# Patient Record
Sex: Male | Born: 1935 | Race: White | Hispanic: No | Marital: Married | Smoking: Current some day smoker
Health system: Southern US, Community
[De-identification: ages and names within clinical notes are randomized; demographics above are authoritative.]

## PROBLEM LIST (undated history)

## (undated) DIAGNOSIS — E7800 Pure hypercholesterolemia, unspecified: Secondary | ICD-10-CM

## (undated) DIAGNOSIS — Z800 Family history of malignant neoplasm of digestive organs: Secondary | ICD-10-CM

## (undated) DIAGNOSIS — Z8481 Family history of carrier of genetic disease: Secondary | ICD-10-CM

## (undated) DIAGNOSIS — Z8042 Family history of malignant neoplasm of prostate: Secondary | ICD-10-CM

## (undated) DIAGNOSIS — Z8049 Family history of malignant neoplasm of other genital organs: Secondary | ICD-10-CM

## (undated) DIAGNOSIS — C801 Malignant (primary) neoplasm, unspecified: Secondary | ICD-10-CM

## (undated) DIAGNOSIS — Z801 Family history of malignant neoplasm of trachea, bronchus and lung: Secondary | ICD-10-CM

## (undated) DIAGNOSIS — C61 Malignant neoplasm of prostate: Secondary | ICD-10-CM

## (undated) DIAGNOSIS — C320 Malignant neoplasm of glottis: Secondary | ICD-10-CM

## (undated) DIAGNOSIS — M1990 Unspecified osteoarthritis, unspecified site: Secondary | ICD-10-CM

## (undated) DIAGNOSIS — T7840XA Allergy, unspecified, initial encounter: Secondary | ICD-10-CM

## (undated) DIAGNOSIS — C4492 Squamous cell carcinoma of skin, unspecified: Secondary | ICD-10-CM

## (undated) DIAGNOSIS — K219 Gastro-esophageal reflux disease without esophagitis: Secondary | ICD-10-CM

## (undated) HISTORY — PX: PROSTATECTOMY: SHX69

## (undated) HISTORY — PX: TONSILLECTOMY: SUR1361

## (undated) HISTORY — DX: Malignant neoplasm of glottis: C32.0

## (undated) HISTORY — DX: Family history of malignant neoplasm of prostate: Z80.42

## (undated) HISTORY — DX: Family history of carrier of genetic disease: Z84.81

## (undated) HISTORY — DX: Family history of malignant neoplasm of trachea, bronchus and lung: Z80.1

## (undated) HISTORY — PX: JOINT REPLACEMENT: SHX530

## (undated) HISTORY — PX: SKIN CANCER EXCISION: SHX779

## (undated) HISTORY — DX: Squamous cell carcinoma of skin, unspecified: C44.92

## (undated) HISTORY — DX: Family history of malignant neoplasm of other genital organs: Z80.49

## (undated) HISTORY — DX: Family history of malignant neoplasm of digestive organs: Z80.0

## (undated) HISTORY — DX: Allergy, unspecified, initial encounter: T78.40XA

## (undated) HISTORY — DX: Malignant neoplasm of prostate: C61

---

## 1999-12-04 ENCOUNTER — Encounter: Payer: Self-pay | Admitting: Gastroenterology

## 2004-09-12 ENCOUNTER — Ambulatory Visit: Payer: Self-pay | Admitting: Pulmonary Disease

## 2004-11-12 ENCOUNTER — Ambulatory Visit: Payer: Self-pay | Admitting: Pulmonary Disease

## 2004-11-19 ENCOUNTER — Ambulatory Visit: Payer: Self-pay | Admitting: Pulmonary Disease

## 2005-03-11 ENCOUNTER — Ambulatory Visit: Payer: Self-pay | Admitting: Pulmonary Disease

## 2005-08-22 ENCOUNTER — Ambulatory Visit (HOSPITAL_COMMUNITY): Admission: RE | Admit: 2005-08-22 | Discharge: 2005-08-22 | Payer: Self-pay | Admitting: Urology

## 2005-09-25 ENCOUNTER — Ambulatory Visit: Payer: Self-pay | Admitting: Pulmonary Disease

## 2005-11-07 ENCOUNTER — Ambulatory Visit: Payer: Self-pay | Admitting: Pulmonary Disease

## 2005-11-19 ENCOUNTER — Ambulatory Visit: Payer: Self-pay | Admitting: Pulmonary Disease

## 2005-11-22 ENCOUNTER — Ambulatory Visit: Payer: Self-pay | Admitting: Cardiology

## 2005-12-05 ENCOUNTER — Ambulatory Visit: Payer: Self-pay | Admitting: Pulmonary Disease

## 2005-12-05 ENCOUNTER — Ambulatory Visit: Payer: Self-pay | Admitting: Cardiovascular Disease

## 2005-12-09 ENCOUNTER — Ambulatory Visit: Payer: Self-pay | Admitting: Pulmonary Disease

## 2005-12-19 ENCOUNTER — Ambulatory Visit: Payer: Self-pay | Admitting: Cardiovascular Disease

## 2006-01-15 ENCOUNTER — Ambulatory Visit: Payer: Self-pay | Admitting: Gastroenterology

## 2006-01-17 ENCOUNTER — Ambulatory Visit: Payer: Self-pay

## 2006-01-17 ENCOUNTER — Ambulatory Visit: Payer: Self-pay | Admitting: Cardiovascular Disease

## 2006-08-12 ENCOUNTER — Ambulatory Visit: Payer: Self-pay | Admitting: Pulmonary Disease

## 2006-12-15 ENCOUNTER — Ambulatory Visit: Payer: Self-pay | Admitting: Pulmonary Disease

## 2006-12-15 LAB — CONVERTED CEMR LAB
ALT: 18 units/L (ref 0–40)
AST: 20 units/L (ref 0–37)
Albumin: 3.5 g/dL (ref 3.5–5.2)
Alkaline Phosphatase: 51 units/L (ref 39–117)
BUN: 18 mg/dL (ref 6–23)
Bacteria, UA: NEGATIVE
Basophils Absolute: 0 10*3/uL (ref 0.0–0.1)
Basophils Relative: 0.8 % (ref 0.0–1.0)
Bilirubin Urine: NEGATIVE
Bilirubin, Direct: 0.2 mg/dL (ref 0.0–0.3)
CO2: 29 meq/L (ref 19–32)
Calcium: 8.9 mg/dL (ref 8.4–10.5)
Chloride: 106 meq/L (ref 96–112)
Cholesterol: 174 mg/dL (ref 0–200)
Creatinine, Ser: 0.9 mg/dL (ref 0.4–1.5)
Crystals: NEGATIVE
Eosinophils Absolute: 0.1 10*3/uL (ref 0.0–0.6)
Eosinophils Relative: 2.6 % (ref 0.0–5.0)
GFR calc Af Amer: 106 mL/min
GFR calc non Af Amer: 87 mL/min
Glucose, Bld: 117 mg/dL — ABNORMAL HIGH (ref 70–99)
HCT: 44.4 % (ref 39.0–52.0)
HDL: 49.8 mg/dL (ref >39.0)
Hemoglobin: 15 g/dL (ref 13.0–17.0)
Ketones, ur: NEGATIVE mg/dL
LDL Cholesterol: 113 mg/dL — ABNORMAL HIGH (ref 0–99)
Leukocytes, UA: NEGATIVE
Lymphocytes Relative: 25.9 % (ref 12.0–46.0)
MCHC: 33.9 g/dL (ref 30.0–36.0)
MCV: 96.4 fL (ref 78.0–100.0)
Monocytes Absolute: 0.5 10*3/uL (ref 0.2–0.7)
Monocytes Relative: 10.4 % (ref 3.0–11.0)
Mucus, UA: NEGATIVE
Neutro Abs: 2.7 10*3/uL (ref 1.4–7.7)
Neutrophils Relative %: 60.3 % (ref 43.0–77.0)
Nitrite: NEGATIVE
PSA: 0.01 ng/mL — ABNORMAL LOW (ref 0.10–4.00)
Platelets: 246 10*3/uL (ref 150–400)
Potassium: 4 meq/L (ref 3.5–5.1)
RBC: 4.6 M/uL (ref 4.22–5.81)
RDW: 12.2 % (ref 11.5–14.6)
Sodium: 142 meq/L (ref 135–145)
Specific Gravity, Urine: 1.02 (ref 1.000–1.03)
TSH: 2.26 microintl units/mL (ref 0.35–5.50)
Total Bilirubin: 1.2 mg/dL (ref 0.3–1.2)
Total CHOL/HDL Ratio: 3.5
Total Protein, Urine: NEGATIVE mg/dL
Total Protein: 6.7 g/dL (ref 6.0–8.3)
Triglycerides: 56 mg/dL (ref 0–149)
Urine Glucose: NEGATIVE mg/dL
Urobilinogen, UA: 0.2 (ref 0.0–1.0)
VLDL: 11 mg/dL (ref 0–40)
WBC, UA: NONE SEEN cells/hpf
WBC: 4.5 10*3/uL (ref 4.5–10.5)
pH: 6 (ref 5.0–8.0)

## 2006-12-22 ENCOUNTER — Ambulatory Visit: Payer: Self-pay | Admitting: Pulmonary Disease

## 2007-04-01 ENCOUNTER — Ambulatory Visit: Payer: Self-pay | Admitting: Pulmonary Disease

## 2007-04-01 LAB — CONVERTED CEMR LAB
ALT: 16 units/L (ref 0–40)
AST: 19 units/L (ref 0–37)
Albumin: 3.6 g/dL (ref 3.5–5.2)
Alkaline Phosphatase: 44 units/L (ref 39–117)
BUN: 22 mg/dL (ref 6–23)
Bilirubin, Direct: 0.1 mg/dL (ref 0.0–0.3)
CO2: 29 meq/L (ref 19–32)
Calcium: 8.8 mg/dL (ref 8.4–10.5)
Chloride: 108 meq/L (ref 96–112)
Cholesterol: 172 mg/dL (ref 0–200)
Creatinine, Ser: 1 mg/dL (ref 0.4–1.5)
GFR calc Af Amer: 94 mL/min
GFR calc non Af Amer: 77 mL/min
Glucose, Bld: 108 mg/dL — ABNORMAL HIGH (ref 70–99)
HDL: 53.3 mg/dL (ref >39.0)
LDL Cholesterol: 110 mg/dL — ABNORMAL HIGH (ref 0–99)
Potassium: 4.2 meq/L (ref 3.5–5.1)
Sodium: 142 meq/L (ref 135–145)
Total Bilirubin: 1 mg/dL (ref 0.3–1.2)
Total CHOL/HDL Ratio: 3.2
Total Protein: 6.7 g/dL (ref 6.0–8.3)
Triglycerides: 46 mg/dL (ref 0–149)
VLDL: 9 mg/dL (ref 0–40)

## 2007-08-12 ENCOUNTER — Ambulatory Visit: Payer: Self-pay | Admitting: Pulmonary Disease

## 2007-12-01 ENCOUNTER — Telehealth: Payer: Self-pay | Admitting: Pulmonary Disease

## 2007-12-22 ENCOUNTER — Ambulatory Visit: Payer: Self-pay | Admitting: Pulmonary Disease

## 2007-12-22 DIAGNOSIS — E7800 Pure hypercholesterolemia, unspecified: Secondary | ICD-10-CM | POA: Insufficient documentation

## 2007-12-22 LAB — CONVERTED CEMR LAB: Vit D, 1,25-Dihydroxy: 45 (ref 30–89)

## 2007-12-30 ENCOUNTER — Ambulatory Visit: Payer: Self-pay | Admitting: Pulmonary Disease

## 2007-12-30 DIAGNOSIS — N281 Cyst of kidney, acquired: Secondary | ICD-10-CM | POA: Insufficient documentation

## 2007-12-30 DIAGNOSIS — J209 Acute bronchitis, unspecified: Secondary | ICD-10-CM | POA: Insufficient documentation

## 2007-12-30 DIAGNOSIS — D71 Functional disorders of polymorphonuclear neutrophils: Secondary | ICD-10-CM | POA: Insufficient documentation

## 2007-12-30 DIAGNOSIS — I10 Essential (primary) hypertension: Secondary | ICD-10-CM | POA: Insufficient documentation

## 2007-12-30 DIAGNOSIS — C61 Malignant neoplasm of prostate: Secondary | ICD-10-CM | POA: Insufficient documentation

## 2007-12-30 DIAGNOSIS — K5730 Diverticulosis of large intestine without perforation or abscess without bleeding: Secondary | ICD-10-CM | POA: Insufficient documentation

## 2007-12-30 DIAGNOSIS — D719 Functional disorders of polymorphonuclear neutrophils, unspecified: Secondary | ICD-10-CM | POA: Insufficient documentation

## 2007-12-30 DIAGNOSIS — IMO0002 Reserved for concepts with insufficient information to code with codable children: Secondary | ICD-10-CM | POA: Insufficient documentation

## 2007-12-30 DIAGNOSIS — C439 Malignant melanoma of skin, unspecified: Secondary | ICD-10-CM | POA: Insufficient documentation

## 2007-12-30 LAB — CONVERTED CEMR LAB
ALT: 24 units/L (ref 0–53)
AST: 21 units/L (ref 0–37)
Albumin: 3.8 g/dL (ref 3.5–5.2)
Alkaline Phosphatase: 42 units/L (ref 39–117)
BUN: 18 mg/dL (ref 6–23)
Bacteria, UA: NEGATIVE
Basophils Absolute: 0 10*3/uL (ref 0.0–0.1)
Basophils Relative: 0.9 % (ref 0.0–1.0)
Bilirubin Urine: NEGATIVE
Bilirubin, Direct: 0.2 mg/dL (ref 0.0–0.3)
CO2: 31 meq/L (ref 19–32)
Calcium: 9.2 mg/dL (ref 8.4–10.5)
Chloride: 105 meq/L (ref 96–112)
Cholesterol: 160 mg/dL (ref 0–200)
Creatinine, Ser: 0.9 mg/dL (ref 0.4–1.5)
Crystals: NEGATIVE
Eosinophils Absolute: 0.1 10*3/uL (ref 0.0–0.6)
Eosinophils Relative: 2.7 % (ref 0.0–5.0)
GFR calc Af Amer: 106 mL/min
GFR calc non Af Amer: 87 mL/min
Glucose, Bld: 102 mg/dL — ABNORMAL HIGH (ref 70–99)
HCT: 44 % (ref 39.0–52.0)
HDL: 53.6 mg/dL (ref >39.0)
Hemoglobin: 14.9 g/dL (ref 13.0–17.0)
LDL Cholesterol: 98 mg/dL (ref 0–99)
Leukocytes, UA: NEGATIVE
Lymphocytes Relative: 28.1 % (ref 12.0–46.0)
MCHC: 33.7 g/dL (ref 30.0–36.0)
MCV: 95.4 fL (ref 78.0–100.0)
Monocytes Absolute: 0.3 10*3/uL (ref 0.2–0.7)
Monocytes Relative: 8.7 % (ref 3.0–11.0)
Neutro Abs: 2.5 10*3/uL (ref 1.4–7.7)
Neutrophils Relative %: 59.6 % (ref 43.0–77.0)
Nitrite: NEGATIVE
PSA: 0 ng/mL — ABNORMAL LOW (ref 0.10–4.00)
Platelets: 253 10*3/uL (ref 150–400)
Potassium: 4.1 meq/L (ref 3.5–5.1)
RBC: 4.61 M/uL (ref 4.22–5.81)
RDW: 12.4 % (ref 11.5–14.6)
Sodium: 140 meq/L (ref 135–145)
Specific Gravity, Urine: 1.02 (ref 1.000–1.03)
Squamous Epithelial / HPF: NEGATIVE /lpf
TSH: 1.67 microintl units/mL (ref 0.35–5.50)
Total Bilirubin: 1.2 mg/dL (ref 0.3–1.2)
Total CHOL/HDL Ratio: 3
Total Protein, Urine: NEGATIVE mg/dL
Total Protein: 6.6 g/dL (ref 6.0–8.3)
Triglycerides: 42 mg/dL (ref 0–149)
Urine Glucose: NEGATIVE mg/dL
Urobilinogen, UA: 0.2 (ref 0.0–1.0)
VLDL: 8 mg/dL (ref 0–40)
WBC, UA: NONE SEEN cells/hpf
WBC: 4 10*3/uL — ABNORMAL LOW (ref 4.5–10.5)
pH: 6 (ref 5.0–8.0)

## 2008-08-03 ENCOUNTER — Ambulatory Visit: Payer: Self-pay | Admitting: Pulmonary Disease

## 2008-12-20 ENCOUNTER — Telehealth: Payer: Self-pay | Admitting: Pulmonary Disease

## 2008-12-21 ENCOUNTER — Ambulatory Visit: Payer: Self-pay | Admitting: Pulmonary Disease

## 2008-12-28 ENCOUNTER — Ambulatory Visit: Payer: Self-pay | Admitting: Pulmonary Disease

## 2008-12-28 LAB — CONVERTED CEMR LAB
ALT: 18 units/L (ref 0–53)
AST: 19 units/L (ref 0–37)
Albumin: 3.8 g/dL (ref 3.5–5.2)
Alkaline Phosphatase: 44 units/L (ref 39–117)
BUN: 22 mg/dL (ref 6–23)
Basophils Absolute: 0 10*3/uL (ref 0.0–0.1)
Basophils Relative: 0.8 % (ref 0.0–3.0)
Bilirubin Urine: NEGATIVE
Bilirubin, Direct: 0.1 mg/dL (ref 0.0–0.3)
CO2: 29 meq/L (ref 19–32)
Calcium: 9.1 mg/dL (ref 8.4–10.5)
Chloride: 106 meq/L (ref 96–112)
Cholesterol: 163 mg/dL (ref 0–200)
Creatinine, Ser: 0.9 mg/dL (ref 0.4–1.5)
Eosinophils Absolute: 0.1 10*3/uL (ref 0.0–0.7)
Eosinophils Relative: 3.4 % (ref 0.0–5.0)
GFR calc Af Amer: 105 mL/min
GFR calc non Af Amer: 87 mL/min
Glucose, Bld: 112 mg/dL — ABNORMAL HIGH (ref 70–99)
HCT: 44.1 % (ref 39.0–52.0)
HDL: 50.9 mg/dL (ref >39.0)
Hemoglobin: 14.9 g/dL (ref 13.0–17.0)
Ketones, ur: NEGATIVE mg/dL
LDL Cholesterol: 104 mg/dL — ABNORMAL HIGH (ref 0–99)
Leukocytes, UA: NEGATIVE
Lymphocytes Relative: 30.4 % (ref 12.0–46.0)
MCHC: 33.8 g/dL (ref 30.0–36.0)
MCV: 97 fL (ref 78.0–100.0)
Monocytes Absolute: 0.5 10*3/uL (ref 0.1–1.0)
Monocytes Relative: 11 % (ref 3.0–12.0)
Neutro Abs: 2.5 10*3/uL (ref 1.4–7.7)
Neutrophils Relative %: 54.4 % (ref 43.0–77.0)
Nitrite: NEGATIVE
PSA: 0.01 ng/mL — ABNORMAL LOW (ref 0.10–4.00)
Platelets: 232 10*3/uL (ref 150–400)
Potassium: 3.9 meq/L (ref 3.5–5.1)
RBC: 4.55 M/uL (ref 4.22–5.81)
RDW: 12.4 % (ref 11.5–14.6)
Sodium: 141 meq/L (ref 135–145)
Specific Gravity, Urine: 1.02 (ref 1.000–1.03)
TSH: 1.92 microintl units/mL (ref 0.35–5.50)
Total Bilirubin: 1.1 mg/dL (ref 0.3–1.2)
Total CHOL/HDL Ratio: 3.2
Total Protein, Urine: NEGATIVE mg/dL
Total Protein: 6.4 g/dL (ref 6.0–8.3)
Triglycerides: 42 mg/dL (ref 0–149)
Urine Glucose: NEGATIVE mg/dL
Urobilinogen, UA: 0.2 (ref 0.0–1.0)
VLDL: 8 mg/dL (ref 0–40)
WBC: 4.4 10*3/uL — ABNORMAL LOW (ref 4.5–10.5)
pH: 6 (ref 5.0–8.0)

## 2009-08-04 ENCOUNTER — Ambulatory Visit: Payer: Self-pay | Admitting: Pulmonary Disease

## 2009-09-29 ENCOUNTER — Ambulatory Visit: Payer: Self-pay | Admitting: Pulmonary Disease

## 2009-10-04 DIAGNOSIS — J309 Allergic rhinitis, unspecified: Secondary | ICD-10-CM | POA: Insufficient documentation

## 2009-10-06 ENCOUNTER — Encounter: Payer: Self-pay | Admitting: Adult Health

## 2009-10-06 LAB — CONVERTED CEMR LAB: Streptococcus, Group A Screen (Direct): NEGATIVE

## 2009-10-09 ENCOUNTER — Telehealth (INDEPENDENT_AMBULATORY_CARE_PROVIDER_SITE_OTHER): Payer: Self-pay | Admitting: *Deleted

## 2009-10-18 ENCOUNTER — Telehealth (INDEPENDENT_AMBULATORY_CARE_PROVIDER_SITE_OTHER): Payer: Self-pay | Admitting: *Deleted

## 2009-10-28 HISTORY — PX: COLONOSCOPY: SHX174

## 2010-01-04 ENCOUNTER — Ambulatory Visit: Payer: Self-pay | Admitting: Sports Medicine

## 2010-01-05 ENCOUNTER — Encounter (INDEPENDENT_AMBULATORY_CARE_PROVIDER_SITE_OTHER): Payer: Self-pay | Admitting: *Deleted

## 2010-01-16 ENCOUNTER — Telehealth (INDEPENDENT_AMBULATORY_CARE_PROVIDER_SITE_OTHER): Payer: Self-pay | Admitting: *Deleted

## 2010-01-18 ENCOUNTER — Ambulatory Visit: Payer: Self-pay | Admitting: Pulmonary Disease

## 2010-01-24 ENCOUNTER — Telehealth: Payer: Self-pay | Admitting: Pulmonary Disease

## 2010-01-28 LAB — CONVERTED CEMR LAB
ALT: 20 units/L (ref 0–53)
AST: 19 units/L (ref 0–37)
Albumin: 3.8 g/dL (ref 3.5–5.2)
Alkaline Phosphatase: 43 units/L (ref 39–117)
BUN: 21 mg/dL (ref 6–23)
Basophils Absolute: 0 10*3/uL (ref 0.0–0.1)
Basophils Relative: 1.2 % (ref 0.0–3.0)
Bilirubin, Direct: 0.2 mg/dL (ref 0.0–0.3)
CO2: 29 meq/L (ref 19–32)
Calcium: 9 mg/dL (ref 8.4–10.5)
Chloride: 107 meq/L (ref 96–112)
Cholesterol: 148 mg/dL (ref 0–200)
Creatinine, Ser: 0.9 mg/dL (ref 0.4–1.5)
Eosinophils Absolute: 0.1 10*3/uL (ref 0.0–0.7)
Eosinophils Relative: 2.9 % (ref 0.0–5.0)
GFR calc non Af Amer: 86.61 mL/min (ref >60)
Glucose, Bld: 104 mg/dL — ABNORMAL HIGH (ref 70–99)
HCT: 42.2 % (ref 39.0–52.0)
HDL: 52.5 mg/dL (ref >39.00)
Hemoglobin: 14.1 g/dL (ref 13.0–17.0)
LDL Cholesterol: 87 mg/dL (ref 0–99)
Lymphocytes Relative: 31.1 % (ref 12.0–46.0)
Lymphs Abs: 1.2 10*3/uL (ref 0.7–4.0)
MCHC: 33.4 g/dL (ref 30.0–36.0)
MCV: 98.6 fL (ref 78.0–100.0)
Monocytes Absolute: 0.4 10*3/uL (ref 0.1–1.0)
Monocytes Relative: 11.1 % (ref 3.0–12.0)
Neutro Abs: 2.2 10*3/uL (ref 1.4–7.7)
Neutrophils Relative %: 53.7 % (ref 43.0–77.0)
PSA: 0 ng/mL — ABNORMAL LOW (ref 0.10–4.00)
Platelets: 227 10*3/uL (ref 150.0–400.0)
Potassium: 4.2 meq/L (ref 3.5–5.1)
RBC: 4.28 M/uL (ref 4.22–5.81)
RDW: 12.6 % (ref 11.5–14.6)
Sodium: 142 meq/L (ref 135–145)
TSH: 2.05 microintl units/mL (ref 0.35–5.50)
Total Bilirubin: 1 mg/dL (ref 0.3–1.2)
Total CHOL/HDL Ratio: 3
Total Protein: 6.5 g/dL (ref 6.0–8.3)
Triglycerides: 41 mg/dL (ref 0.0–149.0)
VLDL: 8.2 mg/dL (ref 0.0–40.0)
WBC: 3.9 10*3/uL — ABNORMAL LOW (ref 4.5–10.5)

## 2010-02-05 ENCOUNTER — Ambulatory Visit: Payer: Self-pay | Admitting: Gastroenterology

## 2010-02-05 DIAGNOSIS — K625 Hemorrhage of anus and rectum: Secondary | ICD-10-CM | POA: Insufficient documentation

## 2010-02-06 ENCOUNTER — Ambulatory Visit: Payer: Self-pay | Admitting: Gastroenterology

## 2010-02-09 ENCOUNTER — Encounter: Payer: Self-pay | Admitting: Gastroenterology

## 2010-06-20 ENCOUNTER — Ambulatory Visit: Payer: Self-pay | Admitting: Pulmonary Disease

## 2010-06-20 DIAGNOSIS — R490 Dysphonia: Secondary | ICD-10-CM | POA: Insufficient documentation

## 2010-06-22 ENCOUNTER — Telehealth: Payer: Self-pay | Admitting: Pulmonary Disease

## 2010-07-09 ENCOUNTER — Encounter: Payer: Self-pay | Admitting: Pulmonary Disease

## 2010-07-17 ENCOUNTER — Encounter: Payer: Self-pay | Admitting: Pulmonary Disease

## 2010-08-07 ENCOUNTER — Encounter: Payer: Self-pay | Admitting: Pulmonary Disease

## 2010-08-13 ENCOUNTER — Encounter: Payer: Self-pay | Admitting: Pulmonary Disease

## 2010-11-17 ENCOUNTER — Encounter: Payer: Self-pay | Admitting: Urology

## 2010-11-27 NOTE — Letter (Signed)
Summary: Results Letter  [facility]  [address] [place], [place] [address]   Phone: [phone]  Fax: [phone]        February 09, 2010 MRN: [id number]    Darren Edwards [address] [place], [place]  [address]    Dear Mr. FOXWORTH,  Your biopsy results did not show any remarkable findings.  Please continue with the recommendations previously discussed.  Should you have any further questions or immediate concers, feel free to contact me.  Sincerely,  Barbette Hair. Arlyce Dice, M.D., [facility]          Sincerely,  Louis Meckel MD  This letter has been electronically signed by your physician.  Appended Document: Results Letter letter mailed 4.20.11.

## 2010-11-27 NOTE — Assessment & Plan Note (Signed)
Summary: blood in stool,constipation...as.    History of Present Illness Visit Type: Initial Visit Primary GI MD: Melvia Heaps MD [facility] Primary Provider: Alroy Dust, MD Chief Complaint: Patient c/o occasional difficulty with constipation and brbpr. He also c/o some rectal discomfort. He denies any problems with abdominal pain. History of Present Illness:   Darren Edwards is  referred at the request of Dr. Jackquline Bosch for evaluation of rectal bleeding.  He complains of painless bright red blood per rectum consistenting of blood mixed with his stools and in the water.  He has had some rectal discomfort in the past although none recently.  Last colonoscopy was 10 years ago where moderate diverticulosis was seen in the sigmoid colon.  He has occasional acid reflux.   GI Review of Systems    Reports acid reflux.      Denies abdominal pain, belching, bloating, chest pain, dysphagia with liquids, dysphagia with solids, heartburn, loss of appetite, nausea, vomiting, vomiting blood, weight loss, and  weight gain.      Reports change in bowel habits, constipation, rectal bleeding, and  rectal pain.     Denies anal fissure, black tarry stools, diarrhea, diverticulosis, fecal incontinence, heme positive stool, hemorrhoids, irritable bowel syndrome, jaundice, light color stool, and  liver problems.    Current Medications (verified): 1)  Adult Aspirin Low Strength 81 Mg  Tbdp (Aspirin) .Marland Kitchen.. 1 By Mouth Daily 2)  Simvastatin 40 Mg Tabs (Simvastatin) .... Take 1/2 Tab By Mouth Once Daily.Marland KitchenMarland Kitchen 3)  Multivitamins   Tabs (Multiple Vitamin) .Marland Kitchen.. 1 By Mouth Daily 4)  Coq10 100 Mg Caps (Coenzyme Q10) .... Take 1 Capsule By Mouth Once A Day 5)  Doctor's Choice (Unknown Dosage) .... Take 3 Tablets Daily (For Diabetes) 6)  Silymarin  Caps (Milk Thistle-Turmeric) .... Take 1 Tablet By Mouth Once A Day  Allergies (verified): 1)  ! * Pencillin  Past History:  Past Medical History: Reviewed history from 02/01/2010 and no  changes required. Hx of ASTHMATIC BRONCHITIS, ACUTE (ICD-466.0)  -  hx AB treated w/ Ab's and Advair transiently in 2003... no recurrent symptoms. CHRONIC GRANULOMATOUS DISEASE (ICD-288.1)  -  old CXR & CTChest 1/07 showed calcif gran in RML and calcif in right hilum and in spleen, no clinical hx of Tb or known exposure... Hx of HYPERTENSION (ICD-401.9)  -  transient elevated BP in the past prev rx w/ Lisinopril 10mg /d... pt prefers diet & exerc and BP's have been fine in recent years...  Family Hx of CORONARY ARTERY DISEASE (ICD-414.00)  -  Brother w/ MI... pt had cath years ago which was reportedly normal... NuclearStressTest 12/03 and again 3/07 were normal- no ischemia, EF=62%... HYPERCHOLESTEROLEMIA (ICD-272.0)  -  on SIMVASTATIN 40mg tabs- 1/2 tab daily + CoQ 10 OTC...  ~  FLP 12/22/07 showed TChol 160, TG 42, HDL 54, LDL 98...   ~  FLP 12/21/08 showed TChol 163, TG 42, HDL 51, LDL 104... he prefers to continue same Rx rather than incr the Simvastatin...   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 never smoked drinks wine 4-5 times a week married 5 children avid runner retired - Airline pilot  Review of Systems  The patient denies allergy/sinus, anemia,  anxiety-new, arthritis/joint pain, back pain, blood in urine, breast changes/lumps, change in vision, confusion, cough, coughing up blood, depression-new, fainting, fatigue, fever, headaches-new, hearing problems, heart murmur, heart rhythm changes, itching, menstrual pain, muscle pains/cramps, night sweats, nosebleeds, pregnancy symptoms, shortness of breath, skin rash, sleeping problems, sore throat, swelling of  feet/legs, swollen lymph glands, thirst - excessive , urination - excessive , urination changes/pain, urine leakage, vision changes, and voice change.    Vital Signs:  Patient profile:   77 year old male Height:      70 inches Weight:      146.38 pounds BMI:     21.08 BSA:     1.83 Pulse rate:   88 / minute Pulse rhythm:   regular BP sitting:   160 / 76  (left arm)  Vitals Entered By: Hortense Ramal CMA Duncan Dull) (February 05, 2010 1:28 PM)  Physical Exam  Additional Exam:  He is a well-developed well-nourished male appearing younger than his stated age  skin: anicteric HEENT: normocephalic; PEERLA; no nasal or pharyngeal abnormalities neck: supple nodes: no cervical lymphadenopathy chest: clear to ausculatation and percussion heart: no murmurs, gallops, or rubs abd: soft, nontender; BS normoactive; no abdominal masses, tenderness, organomegaly rectal: deferred ext: no cynanosis, clubbing, edema skeletal: no deformities neuro: oriented x 3; no focal abnormalities    Impression & Recommendations:  Problem # 1:  RECTAL BLEEDING (ICD-569.3) Assessment New  Limited rectal bleeding could very well be related to hemorrhoids.  A more proximal colonic bleeding source should be ruled out.  Recommendations #1 colonoscopy  Risks, alternatives, and complications of the procedure, including bleeding, perforation, and possible need for surgery, were explained to the patient.  Patient's questions were answered.  Orders: Colonoscopy (Colon)  Problem # 2:  ADENOCARCINOMA, PROSTATE (ICD-185) Assessment: Comment Only  Patient Instructions: 1)  Your colonoscopy is scheduled for 02/06/2010 at [facility] 4th floor arrive 2:30pm. 2)  Colonoscopy and Flexible Sigmoidoscopy brochure given.  3)  Conscious Sedation brochure given.  4)  Copy sent to : Alroy Dust, MD 5)  The medication list was reviewed and reconciled.  All changed / newly prescribed medications were explained.  A complete  medication list was provided to the patient / caregiver. Prescriptions: MOVIPREP 100 GM  SOLR (PEG-KCL-NACL-NASULF-NA ASC-C) As per prep instructions.  #1 x 0   Entered by:   Harlow Mares CMA (AAMA)   Authorized by:   Louis Meckel MD   Signed by:   Harlow Mares CMA (AAMA) on 02/05/2010   Method used:   Electronically to        [facility] [phone]* (retail)       [address]       [place], [place]  [address]       Ph: [phone] or [phone]       Fax: [phone]   RxID:   [id number]

## 2010-11-27 NOTE — Assessment & Plan Note (Signed)
Summary: PLANTER FACIITIS,MC   Vital Signs:  Patient profile:   77 year old male BP sitting:   152 / 97  Vitals Entered By: Lillia Pauls CMA (January 04, 2010 11:24 AM)  History of Present Illness: 77 y/o M with self diagnosed R plantar fasciitis.  Pt has been a runne for > 40 yrs.  Pt just bought [place] sock today and would like to know if these would be useful for his pain.  Pt usually walks +2 miles daily (about 27-28 minutes).  Does up to 5 miles on longer days of walk and jog.  does schwinn air dyne for 45 mins as well.  One day in early Feb 2011he went for a longer walk/run which lead to R foot pain.  Since then he has had R foot pain that has limited his ability to walk without discomfort.  Denies heel pain.  Pain is mostly located in arch area.  +swelling, + redness, +burning sensation ("feels like fire crackers in there")  Pt wears arch straps over each foot.   had also ordered heel lifts  I had seen him 20 yrs ago and given leg exercises for knee pain and this is much better leg length diff with left shorter and uses gel heel lift    Allergies: 1)  ! * Pencillin  Past History:  Past Medical History: Last updated: 09/29/2009 Hx of ASTHMATIC BRONCHITIS, ACUTE (ICD-466.0)  -  hx AB treated w/ Ab's and Advair transiently in 2003... no recurrent symptoms.  CHRONIC GRANULOMATOUS DISEASE (ICD-288.1)  -  old CXR & CTChest 1/07 showed calcif gran in RML and calcif in right hilum and in spleen, no clinical hx of Tb or known exposure...  Hx of HYPERTENSION (ICD-401.9)  -  transient elevated BP in the past prev rx w/ Lisinopril 10mg /d... pt prefers diet & exerc and BP's have been fine in recent years...   Family Hx of CORONARY ARTERY DISEASE (ICD-414.00)  -  Brother w/ MI... pt had cath years ago which was reportedly normal... NuclearStressTest 12/03 and again 3/07 were normal- no ischemia, EF=62%...  HYPERCHOLESTEROLEMIA (ICD-272.0)  -  on SIMVASTATIN 40mg tabs- 1/2 tab daily  + CoQ 10 OTC...  ~  FLP 12/22/07 showed TChol 160, TG 42, HDL 54, LDL 98...   ~  FLP 12/21/08 showed TChol 163, TG 42, HDL 51, LDL 104... he prefers to continue same Rx rather than incr the Simvastatin...     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 at insertion this is only 0.29 There is a small heel spur noted but no tear and is nontender in this area PF membrane to great toe without any nodules  1st TMT: normal 2nd TMT: mild swelling 3rd TMT: some mild effusion; increased angulation of TMT joint 4th and 5th: normal  images saved   Foot/Ankle Exam  Vascular:    dorsalis pedis and posterior tibial pulses 2+ and symmetric  Foot Exam:    Right:    Inspection:  Normal    Palpation:  Normal    Stability:  stable    Tenderness:  no    Swelling:  no    Erythema:  no    No heel pain.   Normal inspection with no visable or palpable fat pad atrophy and no visible swelling/erythema. There is no tenderness at medial insertion of plantar fascia into calcaneus. Great toe motion: full, no pain Arch shape: wnl    Impression & Recommendations:  Problem # 1:  FOOT PAIN, RIGHT (ICD-729.5)  Ultrasound showed normal plantar fascia.  Pain most likely from longitudinal arch strain.  We gave pt Sports insole for arch support.  we may need to add more support and orthotics are a possibility.  Will have pt perform daily excercises to strengthen calf.  Pt to  rtc in 6 wks.    After placing insole in each shoe, pt states less discomfort with walking and running.  Exam showed that he was still favoring the R side on running gait.  Discussed with pt that he should be mindful of this and work to "straighten" his gait.    Orders: [place] LIMITED ([id number]) Sports Insoles [phone])  Complete Medication List: 1)  Adult Aspirin Low Strength 81 Mg Tbdp (Aspirin) .Marland Kitchen.. 1 by mouth daily 2)  Simvastatin 40 Mg Tabs (Simvastatin) .... Take 1/2 tab by mouth once daily.Marland KitchenMarland Kitchen 3)  Multivitamins Tabs (Multiple vitamin) .Marland Kitchen.. 1 by mouth daily  Patient Instructions: 1)  Please schedule a follow-up appointment in 6 weeks. 2)  We use the ultrasound machine to scan your foot today.  I think that your plantar fascia is ok.  Your foot pain is most likely due to arch strain.  3)  Continue using your arch straps. 4)  Excerses for plantar fasciitis: 5)  Toe stretch  6)  Wall stretch 7)  Build up your calf strength with these exercises below, about 3 sets of 8-10 reps 8)  Toe walking forward and backward 9)  Heel walking 10)  Heel raises 11)  Walk-Run interval about 2 miles for a couple of weeks. Then build up to your usual 5 miles daily. 12)  You should take Aleve twice a day MAX  Appended Document: PLANTER FACIITIS,MC

## 2010-11-27 NOTE — Miscellaneous (Signed)
  Medications Added SIMVASTATIN 40 MG TABS (SIMVASTATIN) take 1/2 tab by mouth at bedtime...       Clinical Lists Changes  Medications: Added new medication of SIMVASTATIN 40 MG TABS (SIMVASTATIN) take 1/2 tab by mouth at bedtime... - Signed Rx of SIMVASTATIN 40 MG TABS (SIMVASTATIN) take 1/2 tab by mouth at bedtime...;  #30 x 12;  Signed;  Entered by: Michele Mcalpine MD;  Authorized by: Michele Mcalpine MD;  Method used: Print then Give to Patient    Prescriptions: SIMVASTATIN 40 MG TABS (SIMVASTATIN) take 1/2 tab by mouth at bedtime...  #30 x 12   Entered and Authorized by:   Michele Mcalpine MD   Signed by:   Michele Mcalpine MD on 07/09/2010   Method used:   Print then Give to Patient   RxID:   [id number]

## 2010-11-27 NOTE — Procedures (Signed)
Summary: [employer]il [employer]   Imported By: Sherian Rein 02/07/2010 11:18:37  _____________________________________________________________________  External Attachment:    Type:   Image     Comment:   External Document

## 2010-11-27 NOTE — Letter (Signed)
Summary: New Patient letter  [facility]  [address] [place], [place] [address]   Phone: [phone]  Fax: [phone]       01/05/2010 MRN: [id number]  Darren Edwards [address] [place], [place]  [address]  Dear Darren Edwards,  Welcome to the Gastroenterology Division at [facility].    You are scheduled to see Dr.  Arlyce Dice on 02/05/2010 at 1:30PM on the 3rd floor at [facility], [address]  We ask that you try to arrive at our office 15 minutes prior to your appointment time to allow for check-in.  We would like you to complete the enclosed self-administered evaluation form prior to your visit and bring it with you on the day of your appointment.  We will review it with you.  Also, please bring a complete list of all your medications or, if you prefer, bring the medication bottles and we will list them.  Please bring your insurance card so that we may make a copy of it.  If your insurance requires a referral to see a specialist, please bring your referral form from your primary care physician.  Co-payments are due at the time of your visit and may be paid by cash, check or credit card.     Your office visit will consist of a consult with your physician (includes a physical exam), any laboratory testing he/she may order, scheduling of any necessary diagnostic testing (e.g. x-ray, ultrasound, CT-scan), and scheduling of a procedure (e.g. Endoscopy, Colonoscopy) if required.  Please allow enough time on your schedule to allow for any/all of these possibilities.    If you cannot keep your appointment, please call [phone] to cancel or reschedule prior to your appointment date.  This allows [place] the opportunity to schedule an appointment for another patient in need of care.  If you do not cancel or reschedule by 5 p.m. the business day prior to your appointment date, you will be charged a $50.00 late cancellation/no-show fee.    Thank you for choosing  [facility] for your medical needs.  We appreciate the opportunity to care for you.  Please visit [place] at our website  to learn more about our practice.                     Sincerely,                                                             The Gastroenterology Division

## 2010-11-27 NOTE — Progress Notes (Signed)
Summary: set up labs  Phone Note Call from Patient Call back at Home Phone [phone]   Caller: Spouse Call For: nadel Summary of Call: per spouse marilyn: please set up labs for pt even though his physical isn't due yet. he's having a colonoscopy done soon and just wants to have all his labs done at this time. call when he can come in for this. thanks.  Initial call taken by: Tivis Ringer, CNA,  January 16, 2010 10:07 AM  Follow-up for Phone Call        pt wants to go ahead and have labs done now even though his physical is schedueld for may. He wants these done before he has colonoscopy. Please advise. Carron Curie CMA  January 16, 2010 10:32 AM   Additional Follow-up for Phone Call Additional follow up Details #1::        called and spoke with pts wife---she is aware of labs in the computer for pt on 3/24 and they request a copy be mailed to them once labs are back.  pt has appt in may with SN Randell Loop CMA  January 16, 2010 3:10 PM

## 2010-11-27 NOTE — Miscellaneous (Signed)
Summary[facility]  [facility]   Imported By: Esmeralda Links D'jimraou 08/18/2010 10:34:39  _____________________________________________________________________  External Attachment:    Type:   Image     Comment:   External Document

## 2010-11-27 NOTE — Consult Note (Signed)
Summary: [facility]  [facility]   Imported By: Lennie Odor 07/25/2010 11:19:41  _____________________________________________________________________  External Attachment:    Type:   Image     Comment:   External Document

## 2010-11-27 NOTE — Letter (Signed)
Summary: [facility] Instructions  [facility]  [address] [place], [place] [address]   Phone: [phone]  Fax: [phone]       YOLTZIN BARG    12/04/1928    MRN: [id number]        Procedure Day /Date: 02/06/2010 Tuesday     Arrival Time: 2:30pm     Procedure Time: 3:30pm     Location of Procedure:                    X   [facility] (4th Floor)   PREPARATION FOR COLONOSCOPY WITH MOVIPREP    THE DAY BEFORE YOUR PROCEDURE         DATE: 02/05/2010   DAY:  Monday   1.  Drink clear liquids the entire day-NO SOLID FOOD  2.  Do not drink anything colored red or purple.  Avoid juices with pulp.  No orange juice.  3.  Drink at least 64 oz. (8 glasses) of fluid/clear liquids during the day to prevent dehydration and help the prep work efficiently.  CLEAR LIQUIDS INCLUDE: Water Jello Ice Popsicles Tea (sugar ok, no milk/cream) Powdered fruit flavored drinks Coffee (sugar ok, no milk/cream) Gatorade Juice: apple, white grape, white cranberry  Lemonade Clear bullion, consomm, broth Carbonated beverages (any kind) Strained chicken noodle soup Hard Candy                             4.  In the morning, mix first dose of MoviPrep solution:    Empty 1 Pouch A and 1 Pouch B into the disposable container    Add lukewarm drinking water to the top line of the container. Mix to dissolve    Refrigerate (mixed solution should be used within 24 hrs)  5.  Begin drinking the prep at 5:00 p.m. The MoviPrep container is divided by 4 marks.   Every 15 minutes drink the solution down to the next mark (approximately 8 oz) until the full liter is complete.   6.  Follow completed prep with 16 oz of clear liquid of your choice (Nothing red or purple).  Continue to drink clear liquids until bedtime.  7.  Before going to bed, mix second dose of MoviPrep solution:    Empty 1 Pouch A and 1 Pouch B into the disposable container    Add lukewarm drinking water to  the top line of the container. Mix to dissolve    Refrigerate  THE DAY OF YOUR PROCEDURE      DATE:  02/06/2010 Day: Tuesday  Beginning at 10:30am (5 hours before procedure):         1. Every 15 minutes, drink the solution down to the next mark (approx 8 oz) until the full liter is complete.  2. Follow completed prep with 16 oz. of clear liquid of your choice.    3. You may drink clear liquids until 1:30am  (2 HOURS BEFORE PROCEDURE).   MEDICATION INSTRUCTIONS  Unless otherwise instructed, you should take regular prescription medications with a small sip of water   as early as possible the morning of your procedure.          OTHER INSTRUCTIONS  You will need a responsible adult at least 75 years of age to accompany you and drive you home.   This person must remain in the waiting room during your procedure.  Wear loose fitting clothing that is easily removed.  Leave jewelry and other valuables at home.  However, you may wish to bring a book to read or  an iPod/MP3 player to listen to music as you wait for your procedure to start.  Remove all body piercing jewelry and leave at home.  Total time from sign-in until discharge is approximately 2-3 hours.  You should go home directly after your procedure and rest.  You can resume normal activities the  day after your procedure.  The day of your procedure you should not:   Drive   Make legal decisions   Operate machinery   Drink alcohol   Return to work  You will receive specific instructions about eating, activities and medications before you leave.    The above instructions have been reviewed and explained to me by   _______________________    I fully understand and can verbalize these instructions _____________________________ Date _________

## 2010-11-27 NOTE — Progress Notes (Signed)
Summary: dosage  Phone Note Call from Patient   Caller: Patient Call For: nadel Summary of Call: pt want to talk to dr nadel about zocor dosage. Initial call taken by: Rickard Patience,  June 22, 2010 9:57 AM  Follow-up for Phone Call        Pt states he checked his records and he has been taking Simvastain 80mg  Take 1/2 tablet by mouth once a day since Feb 2008 when his LDL was >100. Pt is concerned about going from Simvastatin 40mg  daily to none. Please advise. Thanks. Zackery Barefoot CMA  June 22, 2010 10:17 AM   Additional Follow-up for Phone Call Additional follow up Details #1::        per SN---ok for pt to stop the zocor now or if he feels like he should wean off of this he can cut dose in half now and take for 2 wks then stop.  called and spoke with pt and he is aware of this.  he will stop the zocor. Randell Loop CMA  June 22, 2010 3:59 PM

## 2010-11-27 NOTE — Procedures (Signed)
Summary: Colonoscopy  Patient: Dymond Spreen Note: All result statuses are Final unless otherwise noted.  Tests: (1) Colonoscopy (COL)   COL Colonoscopy           DONE     [facility]     [address]     [place], [place]  [address]           COLONOSCOPY PROCEDURE REPORT           PATIENT:  Darren Edwards, Darren Edwards  MR#:  [id number]     BIRTHDATE:  02/04/1929, 80 yrs. old  GENDER:  male           ENDOSCOPIST:  Barbette Hair. Arlyce Dice, MD     Referred by:           PROCEDURE DATE:  02/06/2010     PROCEDURE:  Colonoscopy with snare polypectomy     ASA CLASS:  Class II     INDICATIONS:  1) rectal bleeding           MEDICATIONS:   Fentanyl 50 mcg IV, Versed 6 mg IV           DESCRIPTION OF PROCEDURE:   After the risks benefits and     alternatives of the procedure were thoroughly explained, informed     consent was obtained.  Digital rectal exam was performed and     revealed no abnormalities.   The Pentax Colonoscope [id number]     endoscope was introduced through the anus and advanced to the     cecum, which was identified by both the appendix and ileocecal     valve, without limitations.  The quality of the prep was     excellent, using MoviPrep.  The instrument was then slowly     withdrawn as the colon was fully examined.     <<PROCEDUREIMAGES>>           FINDINGS:  A sessile polyp was found in the ascending colon. It     was 4 mm in size. Polyp was snared without cautery. Retrieval was     successful (see image5). snare polyp  Internal hemorrhoids were     found (see image15).  This was otherwise a normal examination of     the colon (see image1, image2, image4, image7, image10, image11,     and image14).   Retroflexed views in the rectum revealed no     abnormalities.    The time to cecum =  1) 7  minutes. The scope     was then withdrawn (time =  1) 3.75  min) from the patient and the     procedure completed.           COMPLICATIONS:  None           ENDOSCOPIC IMPRESSION:     1) 4 mm sessile polyp in the ascending colon     2) Internal hemorrhoids     3) Otherwise normal examination           Limited rectal bleeding secondary to hemorrhoids           RECOMMENDATIONS:     1) Anusol HC supp. prn           REPEAT EXAM:  No           ______________________________     Barbette Hair. Arlyce Dice, MD     cc: Dr. Alroy Dust           n.  eSIGNED:   Barbette Hair. Taquanna Borras at 02/06/2010 04:35 PM           Concha Pyo, [id number]  Note: An exclamation mark (!) indicates a result that was not dispersed into the flowsheet. Document Creation Date: 02/06/2010 4:36 PM _______________________________________________________________________  (1) Order result status: Final Collection or observation date-time: 02/06/2010 16:29 Requested date-time:  Receipt date-time:  Reported date-time:  Referring Physician:   Ordering Physician: Melvia Heaps [phone]) Specimen Source:  Source: Launa Grill Order Number: [id number] Lab site:

## 2010-11-27 NOTE — Progress Notes (Signed)
Summary: copy of labs - awaiting SN signature  Phone Note Call from Patient Call back at Home Phone [phone]   Caller: Spouse//marilyn Call For: Evolet Salminen Summary of Call: Wanted to know if they can come and get a copy of lab results today. Initial call taken by: Darletta Moll,  January 24, 2010 2:57 PM  Follow-up for Phone Call        Pt requestign lab results.  report printed. Pt wants a copy of labs once they are reviewed. Please advise. Carron Curie CMA  January 24, 2010 3:00 PM   Additional Follow-up for Phone Call Additional follow up Details #1::        ok for him to have a copy once SN has reviewed these and signed them off.  thanks Randell Loop CMA  January 24, 2010 3:10 PM   flag sent to [facility] to remind to send a copy to pt once labs are reviewed. I will sign off on message. Carron Curie CMA  January 25, 2010 5:03 PM      Appended Document: copy of labs - awaiting SN signature ATC pt at home #.  LMOM TCB and ask for me or Leigh to give pt his lab results.  Appended Document: copy of labs - awaiting SN signature pt called back this am and he is aware per SN that all labs looked good.  mailed a copy to pt per pts request

## 2010-11-27 NOTE — Assessment & Plan Note (Signed)
Summary: cpx/apc   Primary Care Provider:  Alroy Dust, MD  CC:  18 month ROV & review of mult medical problems....  History of Present Illness: 77 y/o WM here for a follow up visit...    ~  Mar10:  he has no complaints or concerns today and states "I'm doing super"... he is very athletic and runs 4-5 mi 3x/week, does 5K races, and does an exercise bike 3x/week... he is followed at the [facility] yearly and notes that he gets his Zocor for free there...   ~  June 20, 2010:  74mo ROV- "just super" he says... again w/o complaints or concerns, but he is concerned about what he has read about "statins and ALS"> it is his desire to stop his Simvastatin (currently taking 1/2 of the 40mg  pill) and treat his hypercholesterolemia w/ diet & exercise alone... I reminded him of his pos family hx for heart disease (risk factor) but he is undeterred & wants to stop the Zocor Rx... he had fasting labs 3/11 & these are reviewed+ all essent WNL & his PSA remains 0.00 s/p prostate surg 2006 as below...  He had colonoscopy by [facility] 4/11- states his wife made him go due to bl in stool & colon was neg x for 4mm polyp (bx= polypopid mucosa) & int hems...   He saw DrFields 3/11 w/ plantar fasciitis- he felt it was longitudinal arch strain, given arch support & exercises- improved...    Current Problems:   ** HOARSENESS - he has a hoarse, sl weak voice and he notes that he is not able to read aloud for very long... we discussed the need for ENT eval to check his cords & r/o poss lesion... he notes occas reflux symptoms intermittently for 57yrs he says> advised to take OTC PRILOSEC 20mg /d prior to 1st meal of the day.  Hx of ASTHMATIC BRONCHITIS, ACUTE (ICD-466.0)  -  hx AB treated w/ Ab's and Advair transiently in 2003... no recurrent symptoms.  CHRONIC GRANULOMATOUS DISEASE (ICD-288.1)  -  old CXR & CTChest 1/07 showed calcif gran in RML and calcif in right hilum and in spleen, no clinical hx of Tb or known  exposure...  ~  f/u CXR 8/11 showed no change in the calcif mediastinal & hilar LNs, stable.  Hx of HYPERTENSION (ICD-401.9)  -  transient elevated BP in the past prev rx w/ Lisinopril 10mg /d... pt prefers diet & exerc and BP's have been fine in recent years... BP= 124/76 today and averages 120's/ 60's at home... denies HA, fatigue, visual changes, CP, palipit, dizziness, syncope, dyspnea, edema, etc...   Family Hx of CORONARY ARTERY DISEASE (ICD-414.00)  -  he takes ASA 81mg /d... Father had hx CAD, & Brother w/ MI... pt had cath years ago which was reportedly normal... NuclearStressTest 12/03 and again 3/07 were normal- no ischemia, EF=62%...  HYPERCHOLESTEROLEMIA (ICD-272.0)  -  on SIMVASTATIN 40mg tabs- 1/2 tab daily + CoQ 10 OTC...  ~  FLP 2/09 showed TChol 160, TG 42, HDL 54, LDL 98  ~  FLP 2/10 showed TChol 163, TG 42, HDL 51, LDL 104... he prefers to continue same Rx.  ~  FLP 3/11 showed TChol 148, TG 41, HDL 53, LDL 87  ~  8/11: he is concerned about what he has read regarding statins and ALS- he wants to stop his Simva & do the best he can w/o medication on diet alone...  DIVERTICULOSIS OF COLON (ICD-562.10)  -  he had colonoscopy was 2/01 by  DrKaplan & neg x divertics... he states that his wife made him ret to GI due to blood in stool & repeat colonoscopy 4/11 showed 4mm polyp (bx= polypoid mucosa) & int hems...  ADENOCARCINOMA, PROSTATE (ICD-185)  -  Dx 2006 by DrTannenbaum... he went to [facility] for radical prostatectomy surg...  ~  labs 2/10 showed PSA= 0.01  ~  labs 3/11 showed PSA= 0.00  RENAL CYST (ICD-593.2)  -  CTscan has revealed a 3-4cm right renal cyst (upper pole)...  DEGENERATIVE DISC DISEASE (ICD-722.6)  -  periodic evaluations from "sports medicine" DrDaldorf & DrFields for PT and exercises to keep him running... 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  Hx of MALIGNANT MELANOMA (ICD-172.9)  -  stage I MM removed from his back in 6/05 by  DrNolan... he continues to f/u w/ Derm every 6 months (now [facility]).  Health Maintenance:  he takes various nutrients & supplements "to prevent diabetes"  ~  GI:  DrKaplan w/ colonoscopy 4/11 as above.  ~  GU:  DrTannenbaum and [facility]>  we do his yearly PSA & it has remained "zero" since surg.  ~  Immunizations:  he gets the yearly seasonal Flu vaccine each fall;  he had the PNEUMOVAX in 2010 (age 18);  we discussed the 10 yr Tetanus vaccine;  he inquired about the Shingles vaccine & is referred to the [facility] vs Pharm shot clinic for this.   Preventive Screening-Counseling & Management  Alcohol-Tobacco     Smoking Status: never  Allergies: 1)  ! * Pencillin  Comments:  Nurse/Medical Assistant: The patient's medications and allergies were reviewed with the patient and were updated in the Medication and Allergy Lists.  Past History:  Past Medical History: HOARSENESS (EAV-409.81) ALLERGIC RHINITIS (ICD-477.9) Hx of ASTHMATIC BRONCHITIS, ACUTE (ICD-466.0) CHRONIC GRANULOMATOUS DISEASE (ICD-288.1) Hx of HYPERTENSION (ICD-401.9) Family Hx of CORONARY ARTERY DISEASE (ICD-414.00) HYPERCHOLESTEROLEMIA (ICD-272.0) DIVERTICULOSIS OF COLON (ICD-562.10) ADENOCARCINOMA, PROSTATE (ICD-185) RENAL CYST (ICD-593.2) DEGENERATIVE DISC DISEASE (ICD-722.6) Hx of MALIGNANT MELANOMA (ICD-172.9)  Past Surgical History: S/P T & A S/P vasectomy S/P radical prostatectomy melenoma excised from back  Family History: Reviewed history from 02/05/2010 and no changes required. heart disease - father had MI, brother had CABG cancer - sister (lung), father (lung) Family History of Ovarian Cancer: Mother No FH of Colon Cancer:  Social History: Reviewed history from 09/29/2009 and no changes required. never smoked drinks wine 4-5 times a week married 5 children avid runner retired Financial controller Smoking Status:  never  Review of Systems  The patient denies fever, chills, sweats, anorexia,  fatigue, weakness, malaise, weight loss, sleep disorder, blurring, diplopia, eye irritation, eye discharge, vision loss, eye pain, photophobia, earache, ear discharge, tinnitus, decreased hearing, nasal congestion, nosebleeds, sore throat, hoarseness, chest pain, palpitations, syncope, dyspnea on exertion, orthopnea, PND, peripheral edema, cough, dyspnea at rest, excessive sputum, hemoptysis, wheezing, pleurisy, nausea, vomiting, diarrhea, constipation, change in bowel habits, abdominal pain, melena, hematochezia, jaundice, gas/bloating, indigestion/heartburn, dysphagia, odynophagia, dysuria, hematuria, urinary frequency, urinary hesitancy, nocturia, incontinence, back pain, joint pain, joint swelling, muscle cramps, muscle weakness, stiffness, arthritis, sciatica, restless legs, leg pain at night, leg pain with exertion, rash, itching, dryness, suspicious lesions, paralysis, paresthesias, seizures, tremors, vertigo, transient blindness, frequent falls, frequent headaches, difficulty walking, depression, anxiety, memory loss, confusion, cold intolerance, heat intolerance, polydipsia, polyphagia, polyuria, unusual weight change, abnormal bruising, bleeding, enlarged lymph nodes, urticaria, allergic rash, hay fever, and recurrent infections.    Vital Signs:  Patient profile:   77 year old male Height:      70 inches Weight:      144.25 pounds BMI:     20.77 O2 Sat:      98 % on Room air Temp:     98.9 degrees F oral Pulse rate:   78 / minute BP sitting:   124 / 76  (left arm) Cuff size:   regular  Vitals Entered By: Randell Loop CMA (June 20, 2010 1:59 PM)  O2 Sat at Rest %:  98 O2 Flow:  Room air CC: 18 month ROV & review of mult medical problems... Is Patient Diabetic? No Pain Assessment Patient in pain? no      Comments meds updated today with pt   Physical Exam  Additional Exam:  WD, WN, 77 y/o WM in NAD... GENERAL:  Alert & oriented; pleasant & cooperative... HEENT:  [place]/AT,  EOM-wnl, PERRLA, Fundi-benign, EACs-clear, TMs-wnl, NOSE-clear, THROAT-clear & wnl, but voice is sl hoarse & weak. NECK:  Supple w/ fairROM; no JVD; normal carotid impulses w/o bruits; no thyromegaly or nodules palpated; no lymphadenopathy. CHEST:  Clear to P & A; without wheezes/ rales/ or rhonchi. HEART:  Regular Rhythm; without murmurs/ rubs/ or gallops. ABDOMEN:  Soft & nontender; normal bowel sounds; no organomegaly or masses detected.  RECTAL:  Neg - prostate flat/ neg; stool hematest neg. EXT: without deformities, mild arthritic changes; no varicose veins/ venous insuffic/ or edema. NEURO:  CN's intact; motor testing normal; sensory testing normal; gait normal & balance OK. DERM:  No lesions noted; no rash etc...    CXR  Procedure date:  06/20/2010  Findings:      CHEST - 2 VIEW Comparison: 07/16/2002.   Findings: Midline trachea.  Normal heart size.  Calcified mediastinal and right hilar lymph nodes. No pleural effusion or pneumothorax.  Clear lungs.   Lower thoracic spondylosis.   IMPRESSION:   1. No acute cardiopulmonary disease. 2.  Old granulomatous disease.   Read By:  Consuello Bossier,  M.D.   EKG  Procedure date:  06/20/2010  Findings:      Pt did not feel that he needed an EKG- noting his regular exercise program, running, etc> all w/o CP etc...     MISC. Report  Procedure date:  06/20/2010  Findings:      We reviewed his FASTING blood work from 01/18/10...  SN   Impression & Recommendations:  Problem # 1:  HOARSENESS (JYN-829.56) We discussed referral to ENT to check his cords> we will set this up. Orders: T-2 View CXR (71020TC) ENT Referral (ENT)  Problem # 2:  CHRONIC GRANULOMATOUS DISEASE (ICD-288.1) CXR is stable w/ calcif nodes, NAD... Orders: T-2 View CXR (71020TC)  Problem # 3:  HYPERCHOLESTEROLEMIA (ICD-272.0) He wants to stop his Statin Rx as noted above... he will control his lipids w/ diet + exercise, he says. The following  medications were removed from the medication list:    Simvastatin 40 Mg Tabs (Simvastatin) .Marland Kitchen... Take 1/2 tab by mouth once daily...  Problem # 4:  DIVERTICULOSIS OF COLON (ICD-562.10) He is up to date on colonoscopy per DrKaplan...  Problem # 5:  ADENOCARCINOMA, PROSTATE (ICD-185) F/u PSA remains "zero"...  Problem # 6:  DEGENERATIVE DISC DISEASE (ICD-722.6) Followed by DrFields Prn ortho problems.. he remains active w/ walking, jogging, exercise bike, etc...  Problem # 7:  Hx of MALIGNANT MELANOMA (ICD-172.9) He continues Q79mo f/u w/ Derm- now [employer]... Orders: T-2 View CXR (71020TC)  Complete  Medication List: 1)  Adult Aspirin Low Strength 81 Mg Tbdp (Aspirin) .Marland Kitchen.. 1 by mouth daily 2)  Coq10 100 Mg Caps (Coenzyme q10) .... Take 1 capsule by mouth once a day 3)  Multivitamins Tabs (Multiple vitamin) .Marland Kitchen.. 1 by mouth daily 4)  Doctor's Choice (vitamin Supplement)  .... Take as directed (for diabetes prevention)  Patient Instructions: 1)  Today we updated your med list- see below.... 2)  We decided to stop your SIMVASTATIN for now & continue your diet & exercise program... 3)  We will arrange for an ENT appt to check your vocal cords and the hoarseness... 4)  At your convenience- set up a Dermatology f/u w/ DrHall... 5)  At your convenience> get the SHINGLES vaccine at your Pharm or Health[facility]pt clinic.Marland KitchenMarland Kitchen 6)  Call for any problems.Marland KitchenMarland Kitchen 7)  Let's resume your yearly f/u appt & fasting lab work in the spring.Marland KitchenMarland Kitchen

## 2010-11-27 NOTE — Miscellaneous (Signed)
Summary: flu vaccine   Clinical Lists Changes  Observations: Added new observation of FLU VAX: Historical (08/07/2010 16:39)      Immunization History:  Influenza Immunization History:    Influenza:  historical (08/07/2010)

## 2010-12-31 ENCOUNTER — Other Ambulatory Visit: Payer: Self-pay | Admitting: Dermatology

## 2011-01-25 ENCOUNTER — Encounter: Payer: Self-pay | Admitting: Pulmonary Disease

## 2011-02-22 ENCOUNTER — Ambulatory Visit (INDEPENDENT_AMBULATORY_CARE_PROVIDER_SITE_OTHER): Payer: Medicare Other | Admitting: Sports Medicine

## 2011-02-22 VITALS — BP 176/77

## 2011-02-22 DIAGNOSIS — M79609 Pain in unspecified limb: Secondary | ICD-10-CM

## 2011-02-22 DIAGNOSIS — M79669 Pain in unspecified lower leg: Secondary | ICD-10-CM

## 2011-02-22 NOTE — Progress Notes (Signed)
  Subjective:    Patient ID: Darren Edwards, male    DOB: 01-24-1929, 77 y.o.   MRN: [id number]  HPI 77 year old male to office for evaluation of right calf pain x2 weeks. 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 2 weeks ago was competing in a meat, ran a 5K without any difficulties, following day was running the 800 did several sprints prior to advance inserted to have some discomfort in his calf. This continued over the next several days and was noted with running. He denies feeling a tearing or popping sensation. Denies any bruising or erythema. Has been seeing physical therapist Ellamae Sia) who has been doing massage, stem, heat/ice which has been helpful. Has been using bi-helix catheter sleeve which is comfortable. Overall pain is improving, but still present. Able to use stationary bike without any pain. Denies any numbness or tingling.   Review of Systems    per history of present illness, otherwise negative Objective:   Physical Exam GENERAL: Alert and oriented x3, in no acute distress, pleasant MSK: - Lower extremity: Right lower leg with no gross deformity. He has no bruising or erythema. Minimal tenderness along right lateral gastroc with deep palpation, no palpable defects. Patient has negative Thompson's test. Has full range of motion of the ankle without pain. He is able to do heel raise on 1 foot with minimal discomfort. Normal ankle strength in all planes. Left lower leg with no tenderness or weakness. Able to do 1 foot heel raise without pain. Neurovascularly intact distally.  MSK ultrasound: R calf- small area of hypoechoic change within fibers of the lateral gastroc with increased Doppler flow consistent with partial tear, seen on both longitudinal and trans scans.  Few areas of surrounding scar tissue are noted. Few small areas of scar tissue noted in medial gastroc without signs of acute tear.  Images saved.       Assessment & Plan:

## 2011-02-22 NOTE — Assessment & Plan Note (Signed)
-   Right calf pain with evidence of partial tearing of the lateral gastroc on MSK ultrasound today with increased Doppler flow. - Given new set of sports insoles with 3/16 heel lift on both sides, these were comfortable to patient in the office today. - Continue to wear body helix calf sleeve for added compression - Continue exercises as you are doing. - May continue running as long as not having pain >3/10, but would likely benefit from taking it easy for the next several weeks - Followup 4-6 weeks if no improvement, otherwise as needed

## 2011-03-14 ENCOUNTER — Ambulatory Visit: Payer: Self-pay | Admitting: Sports Medicine

## 2011-07-08 ENCOUNTER — Other Ambulatory Visit: Payer: Self-pay | Admitting: Dermatology

## 2011-08-15 ENCOUNTER — Other Ambulatory Visit: Payer: Self-pay

## 2012-01-23 ENCOUNTER — Ambulatory Visit (INDEPENDENT_AMBULATORY_CARE_PROVIDER_SITE_OTHER): Payer: Medicare Other | Admitting: Sports Medicine

## 2012-01-23 ENCOUNTER — Encounter: Payer: Self-pay | Admitting: Sports Medicine

## 2012-01-23 VITALS — BP 170/90 | HR 89 | Ht 70.0 in | Wt 140.0 lb

## 2012-01-23 DIAGNOSIS — M25569 Pain in unspecified knee: Secondary | ICD-10-CM | POA: Diagnosis not present

## 2012-01-23 DIAGNOSIS — M25562 Pain in left knee: Secondary | ICD-10-CM

## 2012-01-23 NOTE — Assessment & Plan Note (Signed)
Left anterior knee pain with a dilated vein on the area of pain.  He likely had a rupture of a small dilated pain which led to a fluid collection and pain.  His exam an ultrasound examination are otherwise normal.  Plan to use a compressive knee sleeve and continue normal activity. Plan to return to clinic in one month if not improved. Patient expresses understanding.

## 2012-01-23 NOTE — Patient Instructions (Signed)
Thank you for coming in today. We think you had a little blood vessel bleed. Continue using the knee brace with running or long walks.  Use ice on the knee after running.  Use aleve as needed.  Let us know if you do not improve.  You do not need to use the knee brace.

## 2012-01-23 NOTE — Progress Notes (Signed)
Darren Edwards is a 78 y.o. male who presents to [facility] today for Left anterior knee pain. He developed mild left anterior knee pain for approximately 3 weeks ago. He jumped to avoid a small dog and landed heavily on his left knee. He completed his run but afterwards noted gradual onset of mild anterior knee pain.  He notes pain along the peripatellar tendon areas of the anterior tibial plateau after running.  He's been taking aleve and using Knee straps which have not helped much.  He denies any swelling locking catching or pain at rest.  He runs 5 miles 3 days a weeks and uses an exercise bike on the no running days.  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     PMH reviewed. Significant for a left calf strain in arch strain over the past 2-3 years. ROS as above otherwise neg. He notes that his blood pressure is well controlled at home Medications reviewed.  Exam:  BP 170/90  Pulse 89  Ht 5\' 10"  (1.778 m)  Wt 140 lb (63.504 kg)  BMI 20.09 kg/m2 Gen: Well NAD WUJ:WJXB: Normal to inspection with no erythema or effusion or obvious bony abnormalities. Palpation normal with no warmth, joint line tenderness, patellar tenderness, or condyle tenderness. ROM full in flexion and extension and lower leg rotation. Ligaments with solid consistent endpoints including ACL, PCL, LCL, MCL. Negative Mcmurray's, Apley's, and Thessalonian tests. Non painful patellar compression. Patellar glide without crepitus. Patellar and quadriceps tendons unremarkable. Hamstring and quadriceps strength is normal.   Ultrasound examination of the knee: Patellar tendon is intact and normal. There is a small spur on the proximal patellar tendon with no significant edema.  The quadriceps tendon is also normal and intact.  There is a small fluid collection that has increased Doppler activity on the Anterior Tibial plateauJ ust lateral to the patellar tendon.  This is consistent with a  dilated vein.  There is a fluid collection beneath the vein.  The medial and lateral and anterior lateral meniscus are normal appearing.

## 2012-01-29 ENCOUNTER — Ambulatory Visit: Payer: Medicare Other | Admitting: Sports Medicine

## 2012-02-13 ENCOUNTER — Other Ambulatory Visit: Payer: Self-pay

## 2012-02-13 DIAGNOSIS — L570 Actinic keratosis: Secondary | ICD-10-CM | POA: Diagnosis not present

## 2012-02-13 DIAGNOSIS — Z85820 Personal history of malignant melanoma of skin: Secondary | ICD-10-CM | POA: Diagnosis not present

## 2012-02-13 DIAGNOSIS — C44621 Squamous cell carcinoma of skin of unspecified upper limb, including shoulder: Secondary | ICD-10-CM | POA: Diagnosis not present

## 2012-03-23 ENCOUNTER — Emergency Department (HOSPITAL_COMMUNITY)
Admission: EM | Admit: 2012-03-23 | Discharge: 2012-03-24 | Disposition: A | Discharge status: Home / Self Care | Payer: Medicare Other | Attending: Emergency Medicine | Admitting: Emergency Medicine

## 2012-03-23 ENCOUNTER — Encounter (HOSPITAL_COMMUNITY): Payer: Self-pay | Admitting: *Deleted

## 2012-03-23 DIAGNOSIS — S81009A Unspecified open wound, unspecified knee, initial encounter: Secondary | ICD-10-CM | POA: Insufficient documentation

## 2012-03-23 DIAGNOSIS — E7800 Pure hypercholesterolemia, unspecified: Secondary | ICD-10-CM | POA: Diagnosis not present

## 2012-03-23 DIAGNOSIS — S81809A Unspecified open wound, unspecified lower leg, initial encounter: Secondary | ICD-10-CM | POA: Diagnosis not present

## 2012-03-23 DIAGNOSIS — S81819A Laceration without foreign body, unspecified lower leg, initial encounter: Secondary | ICD-10-CM

## 2012-03-23 DIAGNOSIS — W01119A Fall on same level from slipping, tripping and stumbling with subsequent striking against unspecified sharp object, initial encounter: Secondary | ICD-10-CM | POA: Insufficient documentation

## 2012-03-23 DIAGNOSIS — W278XXA Contact with other nonpowered hand tool, initial encounter: Secondary | ICD-10-CM | POA: Insufficient documentation

## 2012-03-23 DIAGNOSIS — S91009A Unspecified open wound, unspecified ankle, initial encounter: Secondary | ICD-10-CM | POA: Diagnosis not present

## 2012-03-23 HISTORY — DX: Pure hypercholesterolemia, unspecified: E78.00

## 2012-03-23 NOTE — ED Notes (Signed)
Pt reports he was cutting bushes on a ladder and the ladder shifted and the electric hedge trimmer was off, but cut the right ankle.  Pt had pressure dressing applied. To wound. Pt denies taking blood thinners, but does take an aspirin daily. Pt also has abrasion to left elbow. Pt denies hitting head. Pt ambulatory

## 2012-03-23 NOTE — Discharge Instructions (Signed)
FOLLOW UP WITH DR. NADEL WITH ANY SIGN OF INFECTION - INCREASED PAIN OR REDNESS, DRAINAGE OR FEVER. NO RUNNING FOR 4-5 DAYS. KEEP A PRESSURE BANDAGE IN PLACE AS LONG AS THERE IS BLEEDING. RETURN HERE AS NEEDED.  Laceration Care, Adult A laceration is a cut or lesion that goes through all layers of the skin and into the tissue just beneath the skin. TREATMENT  Some lacerations may not require closure. Some lacerations may not be able to be closed due to an increased risk of infection. It is important to see your caregiver as soon as possible after an injury to minimize the risk of infection and maximize the opportunity for successful closure. If closure is appropriate, pain medicines may be given, if needed. The wound will be cleaned to help prevent infection. Your caregiver will use stitches (sutures), staples, wound glue (adhesive), or skin adhesive strips to repair the laceration. These tools bring the skin edges together to allow for faster healing and a better cosmetic outcome. However, all wounds will heal with a scar. Once the wound has healed, scarring can be minimized by covering the wound with sunscreen during the day for 1 full year. HOME CARE INSTRUCTIONS  For sutures or staples:  Keep the wound clean and dry.   If you were given a bandage (dressing), you should change it at least once a day. Also, change the dressing if it becomes wet or dirty, or as directed by your caregiver.   Wash the wound with soap and water 2 times a day. Rinse the wound off with water to remove all soap. Pat the wound dry with a clean towel.   After cleaning, apply a thin layer of the antibiotic ointment as recommended by your caregiver. This will help prevent infection and keep the dressing from sticking.   You may shower as usual after the first 24 hours. Do not soak the wound in water until the sutures are removed.   Only take over-the-counter or prescription medicines for pain, discomfort, or fever as  directed by your caregiver.   Get your sutures or staples removed as directed by your caregiver.  For skin adhesive strips:  Keep the wound clean and dry.   Do not get the skin adhesive strips wet. You may bathe carefully, using caution to keep the wound dry.   If the wound gets wet, pat it dry with a clean towel.   Skin adhesive strips will fall off on their own. You may trim the strips as the wound heals. Do not remove skin adhesive strips that are still stuck to the wound. They will fall off in time.  For wound adhesive:  You may briefly wet your wound in the shower or bath. Do not soak or scrub the wound. Do not swim. Avoid periods of heavy perspiration until the skin adhesive has fallen off on its own. After showering or bathing, gently pat the wound dry with a clean towel.   Do not apply liquid medicine, cream medicine, or ointment medicine to your wound while the skin adhesive is in place. This may loosen the film before your wound is healed.   If a dressing is placed over the wound, be careful not to apply tape directly over the skin adhesive. This may cause the adhesive to be pulled off before the wound is healed.   Avoid prolonged exposure to sunlight or tanning lamps while the skin adhesive is in place. Exposure to ultraviolet light in the first year will darken  the scar.   The skin adhesive will usually remain in place for 5 to 10 days, then naturally fall off the skin. Do not pick at the adhesive film.  You may need a tetanus shot if:  You cannot remember when you had your last tetanus shot.   You have never had a tetanus shot.  If you get a tetanus shot, your arm may swell, get red, and feel warm to the touch. This is common and not a problem. If you need a tetanus shot and you choose not to have one, there is a rare chance of getting tetanus. Sickness from tetanus can be serious. SEEK MEDICAL CARE IF:   You have redness, swelling, or increasing pain in the wound.   You  see a red line that goes away from the wound.   You have yellowish-white fluid (pus) coming from the wound.   You have a fever.   You notice a bad smell coming from the wound or dressing.   Your wound breaks open before or after sutures have been removed.   You notice something coming out of the wound such as wood or glass.   Your wound is on your hand or foot and you cannot move a finger or toe.  SEEK IMMEDIATE MEDICAL CARE IF:   Your pain is not controlled with prescribed medicine.   You have severe swelling around the wound causing pain and numbness or a change in color in your arm, hand, leg, or foot.   Your wound splits open and starts bleeding.   You have worsening numbness, weakness, or loss of function of any joint around or beyond the wound.   You develop painful lumps near the wound or on the skin anywhere on your body.  MAKE SURE YOU:   Understand these instructions.   Will watch your condition.   Will get help right away if you are not doing well or get worse.  Document Released: 10/14/2005 Document Revised: 10/03/2011 Document Reviewed: 04/09/2011 [facility] Patient Information 2012 [place], [place].

## 2012-03-23 NOTE — ED Provider Notes (Signed)
History     CSN: [id number]  Arrival date & time 03/23/12  2014   First MD Initiated Contact with Patient 03/23/12 2139      Chief Complaint  Patient presents with  . Extremity Laceration  . Fall    (Consider location/radiation/quality/duration/timing/severity/associated sxs/prior treatment) Patient is a 78 y.o. male presenting with fall. The history is provided by the patient.  Fall The accident occurred 3 to 5 hours ago. Incident: The patient slipped while on a ladder, letting go of the hedging shears that dropped and hit his right lower leg causing a laceration.  The pain is mild. He was ambulatory at the scene. Associated symptoms comments: He states that he came for evaluation when the wound would not stop bleeding. No there injury..    Past Medical History  Diagnosis Date  . Hypercholesteremia     Past Surgical History  Procedure Date  . Tonsillectomy   . Prostatectomy     No family history on file.  History  Substance Use Topics  . Smoking status: Never Smoker   . Smokeless tobacco: Never Used  . Alcohol Use: Yes     social       Review of Systems  Musculoskeletal:       See HPI.  Skin: Positive for wound.    Allergies  Penicillins  Home Medications   Current Outpatient Rx  Name Route Sig Dispense Refill  . ASPIRIN 81 MG PO TABS Oral Take 81 mg by mouth daily.    Marland Kitchen SIMVASTATIN 20 MG PO TABS Oral Take 20 mg by mouth every evening.    Marland Kitchen UNDECYLENIC AC-ZN UNDECYLENATE 5-20 % EX OINT Apply externally Apply 1 application topically daily.      BP 145/79  Pulse 77  Temp(Src) 98.1 F (36.7 C) (Oral)  Resp 18  SpO2 99%  Physical Exam  Constitutional: He appears well-developed and well-nourished.  Pulmonary/Chest: Effort normal.  Musculoskeletal:       FROM right lower extremity without swelling or bony deformity.  Skin:       3 cm x 5 cm deep abrasion with central laceration involving muscular layer. No tendon exposure. No active bleeding.  Minimal soft tissue swelling.     ED Course  Procedures (including critical care time)  Labs Reviewed - No data to display No results found. LACERATION REPAIR Performed by: Langley Adie A Authorized by: Langley Adie A Consent: Verbal consent obtained. Risks and benefits: risks, benefits and alternatives were discussed Consent given by: patient Patient identity confirmed: provided demographic data Prepped and Draped in normal sterile fashion Wound explored  Laceration Location: right anterior lower leg  Laceration Length: 3cm  No Foreign Bodies seen or palpated  Anesthesia: local infiltration  Local anesthetic: lidocaine 1% w/o epinephrine  Anesthetic total: 2 ml  Irrigation method: syringe Amount of cleaning: standard  Skin closure: 5-0 vicryl  Number of sutures: 4  Technique: simple interrupted  Vicryl used to close subcutaneous laceration at level of fascia. Overlying skin too thin to be suturable.  Patient tolerance: Patient tolerated the procedure well with no immediate complications.   No diagnosis found.  1. Laceration lower right leg  MDM  Wound was repaired as above, a pressure dressing applied and patient ambulated. There was no recurrent bleeding. Patient comfortable. Discussed wound care. Stable for discharge.        Rodena Medin, PA-C 03/23/12 2359

## 2012-03-23 NOTE — ED Notes (Signed)
Pt able to ambulate without bleeding. Pressure dressing remains intact.

## 2012-03-24 NOTE — ED Provider Notes (Signed)
Medical screening examination/treatment/procedure(s) were performed by non-physician practitioner and as supervising physician I was immediately available for consultation/collaboration.  Toy Baker, MD 03/24/12 2233

## 2012-03-29 ENCOUNTER — Emergency Department (HOSPITAL_COMMUNITY)
Admission: EM | Admit: 2012-03-29 | Discharge: 2012-03-29 | Disposition: A | Discharge status: Home / Self Care | Payer: Medicare Other | Attending: Emergency Medicine | Admitting: Emergency Medicine

## 2012-03-29 ENCOUNTER — Encounter (HOSPITAL_COMMUNITY): Payer: Self-pay | Admitting: *Deleted

## 2012-03-29 DIAGNOSIS — Z4801 Encounter for change or removal of surgical wound dressing: Secondary | ICD-10-CM | POA: Diagnosis not present

## 2012-03-29 DIAGNOSIS — Z79899 Other long term (current) drug therapy: Secondary | ICD-10-CM | POA: Diagnosis not present

## 2012-03-29 DIAGNOSIS — S81009A Unspecified open wound, unspecified knee, initial encounter: Secondary | ICD-10-CM | POA: Insufficient documentation

## 2012-03-29 DIAGNOSIS — Z7982 Long term (current) use of aspirin: Secondary | ICD-10-CM | POA: Diagnosis not present

## 2012-03-29 DIAGNOSIS — W278XXA Contact with other nonpowered hand tool, initial encounter: Secondary | ICD-10-CM | POA: Insufficient documentation

## 2012-03-29 DIAGNOSIS — E7800 Pure hypercholesterolemia, unspecified: Secondary | ICD-10-CM | POA: Diagnosis not present

## 2012-03-29 DIAGNOSIS — S81811A Laceration without foreign body, right lower leg, initial encounter: Secondary | ICD-10-CM

## 2012-03-29 DIAGNOSIS — S81809A Unspecified open wound, unspecified lower leg, initial encounter: Secondary | ICD-10-CM | POA: Diagnosis not present

## 2012-03-29 MED ORDER — CEPHALEXIN 500 MG PO CAPS: 500.0000 mg | ORAL_CAPSULE | Freq: Three times a day (TID) | ORAL | Status: AC

## 2012-03-29 NOTE — ED Notes (Signed)
Pt was seen here for a wound to right lower leg on 5/27. Pt has noted blood and yellow drainage on dressing. Pt will be seeing his running MD on Wednesday. Pt was concerned about going back to running as he has noted blood and drainage on his dressing when he runs. Area is not red, no drainage noted. Area looks like a skin tear - healing but continues to be open. Pt denies pain. No swelling.

## 2012-03-29 NOTE — Discharge Instructions (Signed)
Please read and follow all provided instructions.  Your diagnoses today include:  1. Laceration of right lower extremity     Tests performed today include:  Vital signs. See below for your results today.   Medications prescribed:   Keflex - antibiotic that kills skin bacteria  You have been prescribed an antibiotic medicine: take the entire course of medicine even if you are feeling better. Stopping early can cause the antibiotic not to work.  Home care instructions:  Follow any educational materials contained in this packet. Keep affected area above the level of your heart when possible. Wash area gently twice a day with warm soapy water. Do not apply alcohol or hydrogen peroxide. Cover the area if it draining or weeping.   If you have worsening pain, redness, swelling, or if you notice thick yellow, green, or white drainage from the wound, that is not clear -- please fill antibiotic and see your doctor or return for a wound recheck.   Follow-up instructions: Please follow-up with your primary care provider in the next 1 week for further evaluation of your symptoms. If you do not have a primary care doctor -- see below for referral information.   Return instructions:  Return to the Emergency Department if you have:  Fever  Worsening symptoms  Worsening pain  Worsening swelling  Redness of the skin that moves away from the affected area, especially if it streaks away from the affected area   Any other emergent concerns  Your vital signs today were: BP 164/80  Pulse 86  Temp(Src) 97.7 F (36.5 C) (Oral)  Resp 18  SpO2 99% If your blood pressure (BP) was elevated above 135/85 this visit, please have this repeated by your doctor within one month. -------------- No Primary Care Doctor Call [facility]  [phone] Other agencies that provide inexpensive medical care    [facility]  [phone]    [facility]  [phone]    [facility]  [phone]    [facility]  [phone]    [facility]  [phone]    [facility]  [phone] -------------- RESOURCE GUIDE:  Dental Problems  Patients with Medicaid: [facility] [phone] [address]                                            [phone] [address] Phone:  [phone]                                                   Phone:  [phone]  If unable to pay or uninsured, contact:  [facility] or [facility] to become qualified for the adult dental clinic.  Chronic Pain Problems Contact [facility]  [phone] Patients need to be referred by their primary care doctor.  Insufficient Money for Medicine Contact [employer]:  call "211" or [facility] [phone].  Psychological Services [facility]  [phone] [facility]  [phone] [facility] Mental [facility]   [phone] (emergency services [phone])  Substance Abuse Resources [facility] Drug  Services  [phone] [facility] [phone] [facility] [phone] Floydene Flock [phone] Residential & Outpatient Substance Abuse Program  [phone]  Abuse/Neglect [facility] Child Abuse Hotline 718-655-9863 [facility] Child Abuse Hotline [phone] (After Hours)  Emergency Shelter [facility] 3656931208  Maternity Homes [facility] [place] [facility] [phone] [facility] [phone]  [facility]  [facility]     [employer]                          [facility] [address] [place]                       [address]      [address]  Blondell Reveal Phone:  [phone]                                   Phone:  [phone]                 Phone:   [phone]  [facility] Phone:  [phone]  [facility] Child Abuse Hotline (207)573-1742 [phone] (After Hours)

## 2012-03-29 NOTE — ED Notes (Signed)
PA at bedside.

## 2012-03-29 NOTE — ED Provider Notes (Signed)
Medical screening examination/treatment/procedure(s) were performed by non-physician practitioner and as supervising physician I was immediately available for consultation/collaboration.    Nelia Shi, MD 03/29/12 1235

## 2012-03-29 NOTE — ED Provider Notes (Signed)
History     CSN: [id number]  Arrival date & time 03/29/12  1131   First MD Initiated Contact with Patient 03/29/12 1153      Chief Complaint  Patient presents with  . Wound Check    (Consider location/radiation/quality/duration/timing/severity/associated sxs/prior treatment) HPI Comments: Patient presents for wound recheck after sustaining 3 small punctures to his right shin 6 days ago from a hedge trimmer. Wound was cleaned and several deep sutures were placed at that time. Patient reports that the wound has been healing well. He has noticed a small amount of blood on the dressing when he changes it daily. Last night he noticed some yellow discoloration with the drainage. He was worried that this was possibly returns today for a wound recheck. He does not report worsening pain, redness, swelling, or drainage from that wound. Course is improving. Symptoms are constant. Onset was acute.  Patient is a 78 y.o. male presenting with wound check. The history is provided by the patient and medical records.  Wound Check  He was treated in the ED 5 to 10 days ago. Previous treatment in the ED includes laceration repair. Treatments since wound repair include regular soap and water washings and antibiotic ointment use. There has been colored discharge from the wound. The redness has improved. The swelling has improved. The pain has no pain.    Past Medical History  Diagnosis Date  . Hypercholesteremia     Past Surgical History  Procedure Date  . Tonsillectomy   . Prostatectomy     No family history on file.  History  Substance Use Topics  . Smoking status: Never Smoker   . Smokeless tobacco: Never Used  . Alcohol Use: Yes     social       Review of Systems  Constitutional: Negative for fever.  Gastrointestinal: Negative for nausea and diarrhea.  Musculoskeletal: Negative for myalgias.  Skin: Positive for wound. Negative for rash.  Neurological: Negative for headaches.     Allergies  Penicillins  Home Medications   Current Outpatient Rx  Name Route Sig Dispense Refill  . ASPIRIN 81 MG PO TABS Oral Take 81 mg by mouth daily.    Marland Kitchen SIMVASTATIN 20 MG PO TABS Oral Take 20 mg by mouth every evening.    Marland Kitchen UNDECYLENIC AC-ZN UNDECYLENATE 5-20 % EX OINT Apply externally Apply 1 application topically daily.      BP 164/80  Pulse 86  Temp(Src) 97.7 F (36.5 C) (Oral)  Resp 18  SpO2 99%  Physical Exam  Nursing note and vitals reviewed. Constitutional: He appears well-developed and well-nourished.  HENT:  Head: Normocephalic and atraumatic.  Eyes: Conjunctivae are normal. Right eye exhibits no discharge. Left eye exhibits no discharge.  Neck: Normal range of motion. Neck supple.  Musculoskeletal:       Right knee: Normal. He exhibits normal range of motion.       Right ankle: Normal. He exhibits normal range of motion.       Legs: Neurological: He is alert.  Skin: Skin is warm and dry.  Psychiatric: He has a normal mood and affect.    ED Course  Procedures (including critical care time)  Labs Reviewed - No data to display No results found.   1. Laceration of right lower extremity     12:19 PM Patient seen and examined. D/w Dr. Radford Pax. Will give keflex to take only if he notices worsening redness, swelling, pain, or thick pus draining from wound.    Vital  signs reviewed and are as follows: Filed Vitals:   03/29/12 1143  BP: 164/80  Pulse: 86  Temp: 97.7 F (36.5 C)  Resp: 18   The patient was urged to return to the Emergency Department urgently with worsening pain, swelling, expanding erythema especially if it streaks away from the affected area, fever, or if they have any other concerns. Patient verbalized understanding.   MDM  Wound appears to be healing well. There is no gross evidence of infection. There's no pus draining from the wound. Antibiotics written and patient instructed to take only if he notices pus or if his condition  worsens. Patient does not have diabetes or known peripheral arterial disease to suggest complication with healing.  Renne Crigler, [place] 03/29/12 1230

## 2012-04-01 ENCOUNTER — Ambulatory Visit (INDEPENDENT_AMBULATORY_CARE_PROVIDER_SITE_OTHER): Payer: Medicare Other | Admitting: Sports Medicine

## 2012-04-01 ENCOUNTER — Encounter: Payer: Self-pay | Admitting: Sports Medicine

## 2012-04-01 VITALS — BP 149/78

## 2012-04-01 DIAGNOSIS — M7040 Prepatellar bursitis, unspecified knee: Secondary | ICD-10-CM | POA: Diagnosis not present

## 2012-04-01 DIAGNOSIS — S81809A Unspecified open wound, unspecified lower leg, initial encounter: Secondary | ICD-10-CM | POA: Diagnosis not present

## 2012-04-01 DIAGNOSIS — S81009A Unspecified open wound, unspecified knee, initial encounter: Secondary | ICD-10-CM

## 2012-04-01 DIAGNOSIS — M25562 Pain in left knee: Secondary | ICD-10-CM

## 2012-04-01 DIAGNOSIS — S81819A Laceration without foreign body, unspecified lower leg, initial encounter: Secondary | ICD-10-CM

## 2012-04-01 DIAGNOSIS — M25569 Pain in unspecified knee: Secondary | ICD-10-CM | POA: Diagnosis not present

## 2012-04-01 DIAGNOSIS — S91009A Unspecified open wound, unspecified ankle, initial encounter: Secondary | ICD-10-CM

## 2012-04-01 NOTE — Assessment & Plan Note (Signed)
Healing well on today's exam.  Continue keflex as directed by ER.  Continue dressing changes 2 x day.  Cleanse bid with soap and water. Reviewed red flags for return.

## 2012-04-01 NOTE — Assessment & Plan Note (Signed)
No signs of infection in the bursa at this point except for edema.  No redness. No pain. Since not acutely infected and since pt has a laceration in lower leg that is infected will not aspirate or inject joint at today's appt.  Will do conservative treatment with compression sleeve and watchful waiting.  Pt to return for recheck if any redness, fever, pain or other signs of infection.

## 2012-04-01 NOTE — Progress Notes (Signed)
  Subjective:    Patient ID: Darren Edwards, male    DOB: February 12, 1929, 78 y.o.   MRN: [id number]  HPI Right lower leg laceration f/up: Larey Seat from ladder on memorial day while trimming shrubs-  The trimmer hit pt in leg and caused laceration to right lower leg.  Had to go to ER for sutures.  Had some yellow drainage a few days later and returned to ER and was started on antibiotics.  Here today for recheck.  Decreased drainage.  Minimal redness around wound.  Keeping dressing with antibiotic on wound.     Right knee pain: Pt states that he fell from ladder while trimming shrubs on memorial day.  Above lac occurred and didn't notice any other injury until a few days later noticed that right knee seemed swollen compared to the left and that it hurt if he kneeled down onto knee.  Otherwise no pain in knee with movement, biking, or running.  No redness. No tenderness.  + swelling at front of knee.    Review of Systems As per above.     Objective:   Physical Exam  Constitutional: He appears well-developed and well-nourished.  HENT:  Head: Normocephalic.  Cardiovascular: Normal rate.   Pulmonary/Chest: Effort normal. No respiratory distress.  Musculoskeletal:       Right knee exam: Ligaments stable with normal end points. No redness of joint.  + significant prepatellar swelling present.  No joint effusion present.  Normal rom. Normal 5/5 strength in lower leg.   Normal sensation.  No pain on palpation of knee joint or palpation of swollen prepatellar bursa.            Assessment & Plan:

## 2012-04-07 ENCOUNTER — Ambulatory Visit: Payer: PRIVATE HEALTH INSURANCE | Admitting: Sports Medicine

## 2012-04-07 DIAGNOSIS — M25569 Pain in unspecified knee: Secondary | ICD-10-CM | POA: Insufficient documentation

## 2012-04-22 DIAGNOSIS — Z85828 Personal history of other malignant neoplasm of skin: Secondary | ICD-10-CM | POA: Diagnosis not present

## 2012-04-22 DIAGNOSIS — D485 Neoplasm of uncertain behavior of skin: Secondary | ICD-10-CM | POA: Diagnosis not present

## 2012-04-22 DIAGNOSIS — L570 Actinic keratosis: Secondary | ICD-10-CM | POA: Diagnosis not present

## 2012-06-09 ENCOUNTER — Ambulatory Visit (INDEPENDENT_AMBULATORY_CARE_PROVIDER_SITE_OTHER): Payer: Medicare Other | Admitting: Sports Medicine

## 2012-06-09 ENCOUNTER — Ambulatory Visit: Payer: PRIVATE HEALTH INSURANCE | Admitting: Sports Medicine

## 2012-06-09 VITALS — BP 132/70 | Ht 68.0 in | Wt 140.0 lb

## 2012-06-09 DIAGNOSIS — M25562 Pain in left knee: Secondary | ICD-10-CM

## 2012-06-09 DIAGNOSIS — M25569 Pain in unspecified knee: Secondary | ICD-10-CM

## 2012-06-09 NOTE — Progress Notes (Signed)
Subjective  Mr. Attar is a pleasant active 78 y/o male here for mild left knee pain. He states that he was seen back in spring after a fall which he sustained a possible ruptured vein in his left knee.  Since he was doing better and able to tolerate his usual running routine - which consists of 5 miles, 3 times a week.  Recently in past 1 month he notes moderate pain medially in his left knee towards the end of his runs.  No associated swelling or redness.  Denies any decrease ROM.  Pain improves if he stops his activity and has responded well to low dose NSAIDs.  He states that he also has had some difficulty with stairs.  No neurologic symptoms of weakness or numbness.  No recent injury or trauma.  Obj: NAD  Left knee Full ROM with both flexion and extension No signs of swelling or effusion Mild patellar grind  Noted some decrease in muscle size in both quads Testing of hamstrings shows symmetric strength  No pain on palpation of tibial tuberosity, medial or lateral joint line.  No pain over patella or patellar tendon Neg testing on LCL, MCL Neg testing on Lachman's and PCL Testing Abductors shows bilateral decrease in strength but no pain appreciated. 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 Gave home exercises May continue to use sleeve for strength / stability / compression Ice after running and advise graduated increase back to level of running as tolerated  Bilateral abductor weakness This is likely 2/2 to his prolong distance running with associated calf / hamstring increase in strength and decrease in quad, hip abductor strength Gave specific home exercises to gradually increase in reps / sets and with associated weights - explained in detail Continue to increase level of activity as tolerated Gave reassurance that will improve and should alleviate knee symptoms with strength program Have patient f/u as needed  if not improving.  Patient understands regimen and agrees with plan.

## 2012-06-09 NOTE — Patient Instructions (Signed)
Home exercises for rehab 1. Decline (squat). Standing on a block  (or a book, heels on) - doing 7-10 reps of squats, with small over head weights - do 3 sets, 2 times a day 2. Ankle weights for hip abductors - start with light weights, build up to 15 reps, and 3 sets (make sure get to 15 reps without weight first) 3. Using a step, holding the dumbbells in hands, do a full lateral step up and step down 4. Continue to cross train on bike.

## 2012-06-09 NOTE — Assessment & Plan Note (Signed)
Another flare of left knee pain This seems more of a PFSS problem  Work on quad strength  Work on hip abduct strength  Reck 6 wks

## 2012-07-01 ENCOUNTER — Ambulatory Visit (INDEPENDENT_AMBULATORY_CARE_PROVIDER_SITE_OTHER): Payer: Medicare Other | Admitting: Sports Medicine

## 2012-07-01 ENCOUNTER — Encounter: Payer: Self-pay | Admitting: Sports Medicine

## 2012-07-01 VITALS — BP 176/77 | HR 86

## 2012-07-01 DIAGNOSIS — IMO0002 Reserved for concepts with insufficient information to code with codable children: Secondary | ICD-10-CM

## 2012-07-01 DIAGNOSIS — S81009A Unspecified open wound, unspecified knee, initial encounter: Secondary | ICD-10-CM | POA: Diagnosis not present

## 2012-07-01 DIAGNOSIS — M25569 Pain in unspecified knee: Secondary | ICD-10-CM | POA: Diagnosis not present

## 2012-07-01 DIAGNOSIS — M25562 Pain in left knee: Secondary | ICD-10-CM

## 2012-07-01 DIAGNOSIS — M7040 Prepatellar bursitis, unspecified knee: Secondary | ICD-10-CM | POA: Diagnosis not present

## 2012-07-01 DIAGNOSIS — M23205 Derangement of unspecified medial meniscus due to old tear or injury, unspecified knee: Secondary | ICD-10-CM

## 2012-07-01 NOTE — Assessment & Plan Note (Signed)
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

## 2012-07-01 NOTE — Progress Notes (Signed)
  Subjective:    Patient ID: Darren Edwards, male    DOB: 1929-07-17, 78 y.o.   MRN: [id number]  HPI 1. Knee pain: Darren Edwards is here for followup of left knee pain. Diagnosed with patellofemoral syndrome at previous appointment. Since that time he has had continued difficulty with running 2/2 to pain. He previously been running 5 miles 3 times a week. Pain is located more in the medial portion of the knee and under the patella.he is able to tolerate crosstraining on a bike without any problem. He has been doing prescribed exercises, however squats seem to make his knee pain worse. His has pain going up and down steps. He continues to ice his knee twice per day and uses a compression sleeve. He denies any swelling, locking, weakness.   Review of Systems Per history of present illness    Objective:   Physical Exam L Knee: Normal to inspection with no erythema or effusion or obvious bony abnormalities. Palpation normal with no warmth or joint line tenderness or patellar tenderness or condyle tenderness. ROM normal in flexion and extension and lower leg rotation. Some grinding along patella with flexion and extension Ligaments with solid consistent endpoints including ACL, PCL, LCL, MCL. Negative Mcmurray's and provocative meniscal tests. Non painful patellar compression. Patellar and quadriceps tendons unremarkable. Hamstring and quadriceps strength is normal.  MSK ultrasound: Ultrasound of left knee shows normal suprapatellar pouch, there is a small amount of fluid collection around the quad tendon. Patellar tendon is normal.  Scanning along the medial portion of the knee there is a degenerative tear medial meniscus, with pseudocyst. The lateral meniscus is within normal limits. Patellar groove is normal, with one small spur.        Assessment & Plan:

## 2012-07-01 NOTE — Patient Instructions (Addendum)
Thank you for coming in today, it was good to see you You have been diagnosed with a torn medial meniscus today.  Do the following exercises: 1. Quad sets 2. Abduction leg lifts 3. Leg lifts while on your back   It is ok to continue calf raises Continue to Ice daily Continue your crosstraining on the airdyne Follow up with [place] in 6 weeks

## 2012-07-01 NOTE — Assessment & Plan Note (Signed)
Patient's exam and [place] have changed since initial visit. I think his PFPS sxs have resolved since original injury and medial joint line pain is different and relatively new

## 2012-07-18 DIAGNOSIS — Z23 Encounter for immunization: Secondary | ICD-10-CM | POA: Diagnosis not present

## 2012-08-11 ENCOUNTER — Ambulatory Visit (INDEPENDENT_AMBULATORY_CARE_PROVIDER_SITE_OTHER): Payer: Medicare Other | Admitting: Sports Medicine

## 2012-08-11 VITALS — BP 124/82 | Ht 69.0 in | Wt 135.0 lb

## 2012-08-11 DIAGNOSIS — M23205 Derangement of unspecified medial meniscus due to old tear or injury, unspecified knee: Secondary | ICD-10-CM

## 2012-08-11 DIAGNOSIS — M7040 Prepatellar bursitis, unspecified knee: Secondary | ICD-10-CM | POA: Diagnosis not present

## 2012-08-11 DIAGNOSIS — IMO0002 Reserved for concepts with insufficient information to code with codable children: Secondary | ICD-10-CM

## 2012-08-11 DIAGNOSIS — S81009A Unspecified open wound, unspecified knee, initial encounter: Secondary | ICD-10-CM | POA: Diagnosis not present

## 2012-08-11 DIAGNOSIS — M25569 Pain in unspecified knee: Secondary | ICD-10-CM | POA: Diagnosis not present

## 2012-08-11 NOTE — Patient Instructions (Addendum)
Continue current exercise regimen as tolerated  Please follow up in 2 months  Thank you for seeing [place] today!

## 2012-08-11 NOTE — Assessment & Plan Note (Signed)
This is much improved  Continue using compression sleeve  Continue home exercise program  Gradually increase running as tolerated  Recheck with me in 2 months

## 2012-08-11 NOTE — Progress Notes (Signed)
  Subjective:    Patient ID: Darren Edwards, male    DOB: [birth year], 78 y.o.   MRN: [id number]  HPI  Pt here to f/u on torn medial meniscus with baker's which is improving. Worked up to 40 min of run/walk 3 days per week, rides stationary bike 2 days/week. Continues doing home exercises twice daily, and icing.   Review of Systems     Objective:   Physical Exam  Lt knee exam: Full extension and flexion Neg mcmurray's  No pain along medial joint line No baker's cyst Good quad development bilat, L>R   Ultrasound No joint effusion There is no swelling at this time around the medial meniscus There is some chronic degenerative change but nothing else acute on the examination      Assessment & Plan:

## 2012-08-19 DIAGNOSIS — Z85828 Personal history of other malignant neoplasm of skin: Secondary | ICD-10-CM | POA: Diagnosis not present

## 2012-08-19 DIAGNOSIS — L570 Actinic keratosis: Secondary | ICD-10-CM | POA: Diagnosis not present

## 2012-10-06 ENCOUNTER — Ambulatory Visit: Payer: Medicare Other | Admitting: Sports Medicine

## 2012-11-18 DIAGNOSIS — Z85828 Personal history of other malignant neoplasm of skin: Secondary | ICD-10-CM | POA: Diagnosis not present

## 2012-11-18 DIAGNOSIS — D235 Other benign neoplasm of skin of trunk: Secondary | ICD-10-CM | POA: Diagnosis not present

## 2012-12-12 ENCOUNTER — Other Ambulatory Visit: Payer: Self-pay

## 2012-12-23 ENCOUNTER — Ambulatory Visit (INDEPENDENT_AMBULATORY_CARE_PROVIDER_SITE_OTHER): Payer: Medicare Other | Admitting: Sports Medicine

## 2012-12-23 DIAGNOSIS — M76899 Other specified enthesopathies of unspecified lower limb, excluding foot: Secondary | ICD-10-CM | POA: Diagnosis not present

## 2012-12-23 DIAGNOSIS — M7062 Trochanteric bursitis, left hip: Secondary | ICD-10-CM

## 2012-12-23 NOTE — Progress Notes (Signed)
Chief complaint: Left hip pain  History of present illness: Patient is an 79 year old male who is an avid walker/runner who is coming in with a gradual onset of left hip pain. Patient has been seen previously and has had iliotibial band distally before. Patient continues to do all the stretches that he was given by Dr. fields previously. Patient has been doing this regularly but he notices when he lays on his side or any type of palpation into this area does give him some tenderness. Patient denies any type of trauma. Patient states that this is not stopping him from any activity but notices much worse when he is in a seated position for long amount of time and then beginning to walk.  Past medical history, social, surgical and family history all reviewed.   Physical exam General: No apparent distress Left hip exam: Patient's left hip is tender to palpation over the greater trochanteric area. Patient has great range of motion and good internal rotation. Patient does not have any groin pain with physical exam. Patient is neurovascularly intact distally and has symmetric strength of the hip compared to contralateral side.  Procedure note After verbal and written consent given pt was prepped with betadine.  1:3 kenalog 40 to lidocaine used in left greater trochanteric bursae with a 25g 1/1/2 inch needle.  Pt minimal bleeding dressed with band aid, Pt given red flags to look for pt had better pain control immediatly.

## 2012-12-23 NOTE — Assessment & Plan Note (Signed)
Patient had injection of the greater trochanteric bursa today. Patient states that this resolved all the pain. Patient told to take it easy for the next 48 hours and then begin a stretching regimen. Patient was given a home exercise program. Patient encouraged to continue to do the other eye TB and stretching and knee strengthening exercises he has previously been doing but on a more padded surface. Patient will followup on an as-needed basis for this pain.

## 2013-01-20 ENCOUNTER — Ambulatory Visit: Payer: Medicare Other | Admitting: Sports Medicine

## 2013-02-08 ENCOUNTER — Ambulatory Visit (INDEPENDENT_AMBULATORY_CARE_PROVIDER_SITE_OTHER): Payer: Medicare Other | Admitting: Sports Medicine

## 2013-02-08 VITALS — BP 146/87 | Ht 69.0 in | Wt 135.0 lb

## 2013-02-08 DIAGNOSIS — IMO0002 Reserved for concepts with insufficient information to code with codable children: Secondary | ICD-10-CM

## 2013-02-08 DIAGNOSIS — M7040 Prepatellar bursitis, unspecified knee: Secondary | ICD-10-CM | POA: Diagnosis not present

## 2013-02-08 DIAGNOSIS — M25569 Pain in unspecified knee: Secondary | ICD-10-CM | POA: Diagnosis not present

## 2013-02-08 DIAGNOSIS — M23204 Derangement of unspecified medial meniscus due to old tear or injury, left knee: Secondary | ICD-10-CM

## 2013-02-08 DIAGNOSIS — S81009A Unspecified open wound, unspecified knee, initial encounter: Secondary | ICD-10-CM | POA: Diagnosis not present

## 2013-02-08 NOTE — Progress Notes (Signed)
Patient ID: Darren Edwards, male   DOB: [birth year], 79 y.o.   MRN: [id number]  10 days ago knee hurt after less warmup and no aleve Switched to bike since then Uses compression sleeve Aleve 3x day has helped No swelling  No mechanical sxs No pain today and he has not taken Aleve The pain has been gradually better over the last 3-4 days He comes back to see if he can start back on his running program  He does have a known degenerative tear of the medial meniscus of this left knee  Physical examination No acute distress  Hip abduction strength has improved bilaterally in his normal with no pain over his greater trochanter on today's evaluation  LT Knee Medial joint line hypertrophy but this is nontender of LT Knee: Normal to inspection with no erythema or effusion  Palpation normal with no warmth or joint line tenderness or patellar tenderness or condyle tenderness. ROM normal in flexion and extension and lower leg rotation. Lt is slightly tighter than RT on flexion Ligaments with solid consistent endpoints including ACL, PCL, LCL, MCL. Negative Mcmurray's and provocative meniscal tests. Non painful patellar compression. Patellar and quadriceps tendons unremarkable. Hamstring and quadriceps strength is normal.  He can do [place] test

## 2013-02-08 NOTE — Patient Instructions (Signed)
OK to start some easy running at about half the level you would usually do  Cross train on [employer] as much as you like  Use compression sleeves  Start and keep up a series of exercises as we discussed  Recheck in 6 weeks

## 2013-02-08 NOTE — Assessment & Plan Note (Signed)
He has some mild weakness over his VMO on the left side only  I gave him a series of exercises to keep up 3 for the may and 2 for the hip  Continued crosstraining on the bike  Ease back into running over the next one to 2 weeks  Recheck after 6-8 weeks

## 2013-03-23 ENCOUNTER — Ambulatory Visit (HOSPITAL_BASED_OUTPATIENT_CLINIC_OR_DEPARTMENT_OTHER): Payer: Medicare Other | Admitting: Sports Medicine

## 2013-03-23 VITALS — BP 142/78 | Ht 69.0 in | Wt 135.0 lb

## 2013-03-23 DIAGNOSIS — M7040 Prepatellar bursitis, unspecified knee: Secondary | ICD-10-CM | POA: Diagnosis not present

## 2013-03-23 DIAGNOSIS — M23204 Derangement of unspecified medial meniscus due to old tear or injury, left knee: Secondary | ICD-10-CM

## 2013-03-23 DIAGNOSIS — IMO0002 Reserved for concepts with insufficient information to code with codable children: Secondary | ICD-10-CM

## 2013-03-23 DIAGNOSIS — M25569 Pain in unspecified knee: Secondary | ICD-10-CM | POA: Diagnosis not present

## 2013-03-23 DIAGNOSIS — M76899 Other specified enthesopathies of unspecified lower limb, excluding foot: Secondary | ICD-10-CM | POA: Diagnosis not present

## 2013-03-23 DIAGNOSIS — S81009A Unspecified open wound, unspecified knee, initial encounter: Secondary | ICD-10-CM | POA: Diagnosis not present

## 2013-03-23 MED ORDER — TRAMADOL HCL 50 MG PO TABS: 50.0000 mg | ORAL_TABLET | Freq: Two times a day (BID) | ORAL | Status: DC

## 2013-03-23 NOTE — Patient Instructions (Addendum)
Please avoid running for the next month  Cross train on stationary bike  Continue wearing knee sleeve for activity, and use for an hour at a time as tolerated throughout the day  Please follow up in 1 month  Thank you for seeing [place] today!

## 2013-03-23 NOTE — Progress Notes (Signed)
  Subjective:    Patient ID: Darren Edwards, male    DOB: 10/08/1929, 79 y.o.   MRN: [id number]  HPI  Pt presents to clinic for f/u of lt knee which is still painful. Wearing compression sleeve for increased standing, running, walking. Running 2 days per week 30 minutes, and stationary bike 4 days per week.  Having discomfort constantly. Runs outdoors, never on treadmill. Takes aleve on running days. Has been doing suggested home exercises regularly. Has pain on bent leg extensions- discontinued these.   Review of Systems     Objective:   Physical Exam  NAD Leg lengths equal Quad strength symmetrical Medial joint line hypertrophy Joint line non tender to palpation Extension full bilat 145 deg flexion on lt 160 deg flexion on rt Good hip abduction strength   Able to do 1 leg squat with slight pain  MSK ultrasound The suprapatellar pouch on the left shows a significant effusion not present on the right The quadriceps and patellar tendons are normal Lateral meniscus is unremarkable Medial meniscus starting just past the midline shows degenerative splints, a meniscal cyst, hypoechoic change and marked narrowing of the joint line      Assessment & Plan:

## 2013-03-23 NOTE — Assessment & Plan Note (Signed)
The left medial cartilage support is limited  I think he needs to avoid running for the next month  Continue using the stationary bike Continue compression sleeve Continue home exercise program Recheck in one month  I am not sure that he can get back into running but he certainly should be able to get into a reasonable amount of walk

## 2013-04-27 ENCOUNTER — Encounter: Payer: Self-pay | Admitting: Sports Medicine

## 2013-04-27 ENCOUNTER — Ambulatory Visit (INDEPENDENT_AMBULATORY_CARE_PROVIDER_SITE_OTHER): Payer: Medicare Other | Admitting: Sports Medicine

## 2013-04-27 VITALS — BP 165/83 | HR 86 | Ht 69.0 in | Wt 135.0 lb

## 2013-04-27 DIAGNOSIS — IMO0002 Reserved for concepts with insufficient information to code with codable children: Secondary | ICD-10-CM | POA: Diagnosis not present

## 2013-04-27 DIAGNOSIS — M7040 Prepatellar bursitis, unspecified knee: Secondary | ICD-10-CM | POA: Diagnosis not present

## 2013-04-27 DIAGNOSIS — S81009A Unspecified open wound, unspecified knee, initial encounter: Secondary | ICD-10-CM | POA: Diagnosis not present

## 2013-04-27 DIAGNOSIS — M23204 Derangement of unspecified medial meniscus due to old tear or injury, left knee: Secondary | ICD-10-CM

## 2013-04-27 DIAGNOSIS — M25569 Pain in unspecified knee: Secondary | ICD-10-CM | POA: Diagnosis not present

## 2013-04-27 NOTE — Assessment & Plan Note (Addendum)
Tolerating bike well, still with pain on stairs.  Expect gradual improvement over the next months. Ultrasound shows improvement in swelling. Continue biking as tolerated. Okay to slowly re-introduce running as tolerated. Continue to use the compression sleeve - leave on for at least 30-60 minutes post exercise. Use tramadol as needed for flares. Follow up as needed.

## 2013-04-27 NOTE — Patient Instructions (Addendum)
Please continue to use the bike. Once your pain is better, you can try some fast walking.  If that goes well, you can try some running alternating with walking. Please wear the compression sleeve whenever you are doing activity.  Keep the sleeve on 30-60 minutes after completing the exercise. Take the tramadol as needed only. Follow up as needed.

## 2013-04-27 NOTE — Progress Notes (Signed)
Patient ID: [name], male   DOB: 11/18/1928, 79 y.o.   MRN: [id number] Subjective: Darren Edwards is here for f/u of left medial meniscus degenerative tear.  He reports that he has been using the stationary bike up to 5 times a week without pain.  He has not been running for over a month.  Most of the pain is with stairs; located primarily in the medial part of the left knee.  Denies any locking or clicking.  Does state that maybe a month ago the knee locked for a brief moment.  He has not been doing the rehab exercises as they make his knee swell.  He has been icing the knee daily.  Reports that the compression sleeve helps a lot.  Has stopped taking the tramadol as it caused constipation and did not make a big difference with his pain.  Objective: BP 165/83  Pulse 86  Ht 5\' 9"  (1.753 m)  Wt 135 lb (61.236 kg)  BMI 19.93 kg/m2 Gen: alert, cooperative, NAD Left Knee: Normal to inspection with no erythema or effusion or obvious bony abnormalities.  Does have some hypertrophy of the medial part of his knee. Palpation normal with no warmth, joint line tenderness, patellar tenderness, or condyle tenderness. ROM full in flexion and extension and lower leg rotation. Ligaments with solid consistent endpoints including ACL, LCL, MCL. Positive thessaly test Patellar and quadriceps tendons unremarkable. Hamstring and quadriceps strength is normal.   [facility]K Ultrasound Left knee - marked improvement in suprapatellar fluid collection - resolution of medial meniscus fluid - persistent hypoechoic area in the medial meniscus consistent with tear  A/P: 79 yo man with a degenerative tear of the left medial meniscus. See problem list for specifics.

## 2013-04-29 DIAGNOSIS — L0293 Carbuncle, unspecified: Secondary | ICD-10-CM | POA: Diagnosis not present

## 2013-04-29 DIAGNOSIS — I8310 Varicose veins of unspecified lower extremity with inflammation: Secondary | ICD-10-CM | POA: Diagnosis not present

## 2013-04-29 DIAGNOSIS — L0292 Furuncle, unspecified: Secondary | ICD-10-CM | POA: Diagnosis not present

## 2013-04-29 DIAGNOSIS — D235 Other benign neoplasm of skin of trunk: Secondary | ICD-10-CM | POA: Diagnosis not present

## 2013-04-29 DIAGNOSIS — L570 Actinic keratosis: Secondary | ICD-10-CM | POA: Diagnosis not present

## 2013-05-20 DIAGNOSIS — L570 Actinic keratosis: Secondary | ICD-10-CM | POA: Diagnosis not present

## 2013-05-20 DIAGNOSIS — L259 Unspecified contact dermatitis, unspecified cause: Secondary | ICD-10-CM | POA: Diagnosis not present

## 2013-05-26 ENCOUNTER — Other Ambulatory Visit: Payer: Self-pay | Admitting: Pulmonary Disease

## 2013-05-26 ENCOUNTER — Encounter: Payer: Self-pay | Admitting: Pulmonary Disease

## 2013-05-26 DIAGNOSIS — R490 Dysphonia: Secondary | ICD-10-CM

## 2013-05-26 NOTE — Telephone Encounter (Signed)
Pt states he has had a "raspy" voice for years but recently is has gotten worse to the point where he cannot read one page out loud without completely losing his voice. The pt states his daughter is a Engineer, civil (consulting) and mentioned that some of her patients take an OTC reflux medication to help with hoarseness. The pt states he has heartburn only about 3 times a year. Pt wants to know do you feel like this will help him or what recs do you have? Carron Curie, CMA Allergies  Allergen Reactions  . Penicillins     REACTION: rash

## 2013-05-31 DIAGNOSIS — IMO0002 Reserved for concepts with insufficient information to code with codable children: Secondary | ICD-10-CM | POA: Diagnosis not present

## 2013-05-31 DIAGNOSIS — M25469 Effusion, unspecified knee: Secondary | ICD-10-CM | POA: Diagnosis not present

## 2013-05-31 DIAGNOSIS — M2240 Chondromalacia patellae, unspecified knee: Secondary | ICD-10-CM | POA: Diagnosis not present

## 2013-05-31 DIAGNOSIS — M25569 Pain in unspecified knee: Secondary | ICD-10-CM | POA: Diagnosis not present

## 2013-05-31 DIAGNOSIS — M1710 Unilateral primary osteoarthritis, unspecified knee: Secondary | ICD-10-CM | POA: Diagnosis not present

## 2013-06-01 DIAGNOSIS — K219 Gastro-esophageal reflux disease without esophagitis: Secondary | ICD-10-CM | POA: Diagnosis not present

## 2013-06-01 DIAGNOSIS — J383 Other diseases of vocal cords: Secondary | ICD-10-CM | POA: Diagnosis not present

## 2013-06-02 ENCOUNTER — Other Ambulatory Visit: Payer: Self-pay

## 2013-06-07 DIAGNOSIS — IMO0002 Reserved for concepts with insufficient information to code with codable children: Secondary | ICD-10-CM | POA: Diagnosis not present

## 2013-06-07 DIAGNOSIS — M1710 Unilateral primary osteoarthritis, unspecified knee: Secondary | ICD-10-CM | POA: Diagnosis not present

## 2013-06-07 DIAGNOSIS — M2240 Chondromalacia patellae, unspecified knee: Secondary | ICD-10-CM | POA: Diagnosis not present

## 2013-06-29 DIAGNOSIS — J383 Other diseases of vocal cords: Secondary | ICD-10-CM | POA: Diagnosis not present

## 2013-06-29 DIAGNOSIS — K219 Gastro-esophageal reflux disease without esophagitis: Secondary | ICD-10-CM | POA: Diagnosis not present

## 2013-06-29 DIAGNOSIS — H905 Unspecified sensorineural hearing loss: Secondary | ICD-10-CM | POA: Diagnosis not present

## 2013-06-30 ENCOUNTER — Other Ambulatory Visit (HOSPITAL_COMMUNITY): Payer: Medicare Other

## 2013-07-07 ENCOUNTER — Ambulatory Visit: Admit: 2013-07-07 | Payer: Medicare Other | Admitting: Otolaryngology

## 2013-07-07 SURGERY — MICROLARYNGOSCOPY WITH CO2 LASER AND EXCISION OF VOCAL CORD LESION
Anesthesia: General | Site: Neck | Laterality: Right

## 2013-07-20 ENCOUNTER — Encounter: Payer: Self-pay | Admitting: Pulmonary Disease

## 2013-07-20 ENCOUNTER — Telehealth: Payer: Self-pay | Admitting: Pulmonary Disease

## 2013-07-20 DIAGNOSIS — I10 Essential (primary) hypertension: Secondary | ICD-10-CM

## 2013-07-20 DIAGNOSIS — F419 Anxiety disorder, unspecified: Secondary | ICD-10-CM

## 2013-07-20 DIAGNOSIS — K5730 Diverticulosis of large intestine without perforation or abscess without bleeding: Secondary | ICD-10-CM

## 2013-07-20 DIAGNOSIS — E7800 Pure hypercholesterolemia, unspecified: Secondary | ICD-10-CM

## 2013-07-20 DIAGNOSIS — C61 Malignant neoplasm of prostate: Secondary | ICD-10-CM

## 2013-07-20 NOTE — Telephone Encounter (Signed)
ATC patients spouse, no answer LMOMTCB

## 2013-07-21 NOTE — Telephone Encounter (Signed)
Spoke with the pt's spouse She is asking if the pt can come in for PSA and FLP Pt last seen here 06/20/10 I advised that he would be considered a new pt and will have to ask SN about this She verbalized understanding, and states that the pt has been doing very well and this is why he has not been back to see [place] in so long Please advise, thanks!

## 2013-07-21 NOTE — Telephone Encounter (Signed)
Per SN---   He will only need flp,bmp,hepat,cbcd,tsh psa---these have been placed in the computer for the pt.   And will need rov with SN

## 2013-07-21 NOTE — Telephone Encounter (Signed)
Spoke with spouse and notified labs can be done now- be sure pt is fasting She verbalized understanding OV with SN was scheduled for 09/01/13 at 12 noon

## 2013-07-22 ENCOUNTER — Other Ambulatory Visit (INDEPENDENT_AMBULATORY_CARE_PROVIDER_SITE_OTHER): Payer: Medicare Other

## 2013-07-22 DIAGNOSIS — E7800 Pure hypercholesterolemia, unspecified: Secondary | ICD-10-CM

## 2013-07-22 DIAGNOSIS — F419 Anxiety disorder, unspecified: Secondary | ICD-10-CM

## 2013-07-22 DIAGNOSIS — F411 Generalized anxiety disorder: Secondary | ICD-10-CM | POA: Diagnosis not present

## 2013-07-22 DIAGNOSIS — K5730 Diverticulosis of large intestine without perforation or abscess without bleeding: Secondary | ICD-10-CM | POA: Diagnosis not present

## 2013-07-22 DIAGNOSIS — C61 Malignant neoplasm of prostate: Secondary | ICD-10-CM | POA: Diagnosis not present

## 2013-07-22 DIAGNOSIS — I10 Essential (primary) hypertension: Secondary | ICD-10-CM | POA: Diagnosis not present

## 2013-07-22 LAB — LIPID PANEL
Cholesterol: 199 mg/dL (ref 0–200)
Total CHOL/HDL Ratio: 4
VLDL: 8.6 mg/dL (ref 0.0–40.0)

## 2013-07-22 LAB — CBC WITH DIFFERENTIAL/PLATELET
Basophils Absolute: 0 10*3/uL (ref 0.0–0.1)
HCT: 42.7 % (ref 39.0–52.0)
Hemoglobin: 14.5 g/dL (ref 13.0–17.0)
Lymphocytes Relative: 34.6 % (ref 12.0–46.0)
Lymphs Abs: 1.6 10*3/uL (ref 0.7–4.0)
MCHC: 33.9 g/dL (ref 30.0–36.0)
MCV: 94.8 fl (ref 78.0–100.0)
Monocytes Absolute: 0.5 10*3/uL (ref 0.1–1.0)
Monocytes Relative: 10.7 % (ref 3.0–12.0)
Neutrophils Relative %: 51.3 % (ref 43.0–77.0)
Platelets: 255 10*3/uL (ref 150.0–400.0)
RDW: 13.8 % (ref 11.5–14.6)

## 2013-07-22 LAB — HEPATIC FUNCTION PANEL
ALT: 17 U/L (ref 0–53)
AST: 18 U/L (ref 0–37)
Albumin: 4 g/dL (ref 3.5–5.2)
Alkaline Phosphatase: 42 U/L (ref 39–117)
Total Bilirubin: 1.3 mg/dL — ABNORMAL HIGH (ref 0.3–1.2)
Total Protein: 6.4 g/dL (ref 6.0–8.3)

## 2013-07-22 LAB — TSH: TSH: 2.1 u[IU]/mL (ref 0.35–5.50)

## 2013-07-22 LAB — BASIC METABOLIC PANEL
BUN: 22 mg/dL (ref 6–23)
CO2: 30 mEq/L (ref 19–32)
Calcium: 9.2 mg/dL (ref 8.4–10.5)
Chloride: 106 mEq/L (ref 96–112)
Creatinine, Ser: 0.8 mg/dL (ref 0.4–1.5)
GFR: 99.79 mL/min (ref >60.00)
Glucose, Bld: 89 mg/dL (ref 70–99)
Potassium: 4.7 mEq/L (ref 3.5–5.1)
Sodium: 141 mEq/L (ref 135–145)

## 2013-07-26 ENCOUNTER — Telehealth: Payer: Self-pay | Admitting: Pulmonary Disease

## 2013-07-26 NOTE — Telephone Encounter (Signed)
Notes Recorded by Michele Mcalpine, MD on 07/22/2013 at 5:29 PM Please notify patient>  FLP shows LDL=137 goal <100; last check 3/11 showed LDL=87 on Simva rx; we will discuss at the f/u OV... Chems, LFTs, CBC, Thyroid> all wnl... PSA is zero...   I spoke with patient about results and he verbalized understanding and had no questions

## 2013-08-03 DIAGNOSIS — Z23 Encounter for immunization: Secondary | ICD-10-CM | POA: Diagnosis not present

## 2013-08-17 ENCOUNTER — Ambulatory Visit (INDEPENDENT_AMBULATORY_CARE_PROVIDER_SITE_OTHER): Payer: Medicare Other | Admitting: Sports Medicine

## 2013-08-17 ENCOUNTER — Encounter: Payer: Self-pay | Admitting: Sports Medicine

## 2013-08-17 VITALS — BP 167/78 | Ht 69.0 in | Wt 136.0 lb

## 2013-08-17 DIAGNOSIS — M7062 Trochanteric bursitis, left hip: Secondary | ICD-10-CM

## 2013-08-17 DIAGNOSIS — S81009A Unspecified open wound, unspecified knee, initial encounter: Secondary | ICD-10-CM | POA: Diagnosis not present

## 2013-08-17 DIAGNOSIS — M7040 Prepatellar bursitis, unspecified knee: Secondary | ICD-10-CM | POA: Diagnosis not present

## 2013-08-17 DIAGNOSIS — IMO0002 Reserved for concepts with insufficient information to code with codable children: Secondary | ICD-10-CM | POA: Diagnosis not present

## 2013-08-17 DIAGNOSIS — M23204 Derangement of unspecified medial meniscus due to old tear or injury, left knee: Secondary | ICD-10-CM

## 2013-08-17 DIAGNOSIS — M25569 Pain in unspecified knee: Secondary | ICD-10-CM | POA: Diagnosis not present

## 2013-08-17 DIAGNOSIS — M76899 Other specified enthesopathies of unspecified lower limb, excluding foot: Secondary | ICD-10-CM

## 2013-08-17 MED ORDER — METHYLPREDNISOLONE ACETATE 40 MG/ML IJ SUSP
40.0000 mg | Freq: Once | INTRAMUSCULAR | Status: AC
  Administered 2013-08-17: 40 mg via INTRA_ARTICULAR

## 2013-08-17 MED ORDER — DICLOFENAC SODIUM 1 % TD GEL: 1.0000 g | Freq: Four times a day (QID) | TRANSDERMAL | Status: DC | PRN

## 2013-08-17 NOTE — Assessment & Plan Note (Signed)
He had excellent results with his last injection in February 2014  Today we repeated his injection and we'll follow him up in 6 weeks to see if he benefits

## 2013-08-17 NOTE — Progress Notes (Signed)
  Subjective:    Patient ID: Darren Edwards, male    DOB: [birth year], 79 y.o.   MRN: [id number]  HPI Comments: Darren Edwards returns today for followup visit for his left partial medial meniscus tear as well as recurrence of left hip pain. Symptoms are worse with any sort of weightbearing, especially with climbing stairs. Patient is usually an active runner, however he has not been able to run since onset of most recent symptoms approximately 6 months ago. He has remained active on a stationary bike without pain with that activity. He states that he does not take anything for pain, no NSAID or tramadol. He initially goes through a warmup is where his knee and hip are stiff, they've been gradually warmup and become less painful after light activity, however after approximately 30 minutes of gentle walking he has exquisite discomfort in his hip and his medial left knee. He has previously had steroid injections in his hip and is wondering about the efficacy of that for his hip as well as his knee today.     Review of Systems  All other systems reviewed and are negative.       Objective:   Physical Exam  Nursing note and vitals reviewed. Constitutional: He appears well-developed and well-nourished. No distress.  Musculoskeletal:  Normal inspection and alignment. Full range of motion of the left hip with internal and external rotation, hip flexion, hip extension. Mild tenderness over the greater trochanter. 5 out of 5 strength in the entire left lower extremity. Full active and passive range of motion of the left knee. No crepitus palpated. No joint effusion. No warmth, erythema, tenderness. Negative McMurray's. No ligamentous laxity.   Procedure note After informed verbal consent and discussion of risks and benefits, the patient was sterilely cleaned and placed in the appropriate position. Landmarks were identified and the site was marked. Cold spray was applied and a prepared injection with 40 mg  Solu-Medrol with lidocaine was injected over the left greater trochanter into the bursa without complication. Red flags and return precautions discussed with the patient.     Assessment & Plan:   79 year old male presents as followup for left knee arthritis with medial meniscus tear as well as recurrent left greater trochanteric hip bursitis.  #1. Left medial meniscus tear with osteoarthritis -Progressing well and experiencing no pain with nonweightbearing activity including exercise bike. Pain with strenuous activity as well as stairs. MRI obtained at [facility] shows severe arthritis of medial knee compartment as well as known partial tear. Continue strengthening exercises as well as topical cream as patient cannot tolerate NSAIDs due to reflux. Discussed injections in the knee, risks and benefits, based on severe osteoarthritis. No injection performed at this visit.  #2. Left hip greater trochanteric bursitis -Steroid injection performed as above. Will limit exercise and stress on joint for next 72 hours. Following that, strengthening exercises discussed. Patient will followup in 6 weeks for repeat evaluation.

## 2013-08-17 NOTE — Assessment & Plan Note (Signed)
This is confirmed with the MRI that was obtained at [facility]  Because he has some grade 4 arthritis I do not think is a good candidate for surgery and I do not think that an injection will give him more than temporary relief  Conservative treatment plan

## 2013-08-30 DIAGNOSIS — K219 Gastro-esophageal reflux disease without esophagitis: Secondary | ICD-10-CM | POA: Diagnosis not present

## 2013-08-30 DIAGNOSIS — H905 Unspecified sensorineural hearing loss: Secondary | ICD-10-CM | POA: Diagnosis not present

## 2013-08-30 DIAGNOSIS — J383 Other diseases of vocal cords: Secondary | ICD-10-CM | POA: Diagnosis not present

## 2013-08-30 DIAGNOSIS — H903 Sensorineural hearing loss, bilateral: Secondary | ICD-10-CM | POA: Diagnosis not present

## 2013-08-30 DIAGNOSIS — J370 Chronic laryngitis: Secondary | ICD-10-CM | POA: Diagnosis not present

## 2013-09-01 ENCOUNTER — Encounter: Payer: Self-pay | Admitting: Pulmonary Disease

## 2013-09-01 ENCOUNTER — Ambulatory Visit (INDEPENDENT_AMBULATORY_CARE_PROVIDER_SITE_OTHER): Payer: Medicare Other | Admitting: Pulmonary Disease

## 2013-09-01 ENCOUNTER — Ambulatory Visit (INDEPENDENT_AMBULATORY_CARE_PROVIDER_SITE_OTHER)
Admission: RE | Admit: 2013-09-01 | Discharge: 2013-09-01 | Disposition: A | Discharge status: Home / Self Care | Payer: Medicare Other | Source: Ambulatory Visit | Attending: Pulmonary Disease | Admitting: Pulmonary Disease

## 2013-09-01 VITALS — BP 140/74 | HR 73 | Temp 97.6°F | Ht 69.0 in | Wt 137.4 lb

## 2013-09-01 DIAGNOSIS — J209 Acute bronchitis, unspecified: Secondary | ICD-10-CM

## 2013-09-01 DIAGNOSIS — D71 Functional disorders of polymorphonuclear neutrophils: Secondary | ICD-10-CM

## 2013-09-01 DIAGNOSIS — J383 Other diseases of vocal cords: Secondary | ICD-10-CM

## 2013-09-01 DIAGNOSIS — J382 Nodules of vocal cords: Secondary | ICD-10-CM

## 2013-09-01 DIAGNOSIS — K5730 Diverticulosis of large intestine without perforation or abscess without bleeding: Secondary | ICD-10-CM

## 2013-09-01 DIAGNOSIS — R918 Other nonspecific abnormal finding of lung field: Secondary | ICD-10-CM | POA: Diagnosis not present

## 2013-09-01 DIAGNOSIS — R490 Dysphonia: Secondary | ICD-10-CM | POA: Diagnosis not present

## 2013-09-01 DIAGNOSIS — IMO0002 Reserved for concepts with insufficient information to code with codable children: Secondary | ICD-10-CM

## 2013-09-01 DIAGNOSIS — E7800 Pure hypercholesterolemia, unspecified: Secondary | ICD-10-CM

## 2013-09-01 DIAGNOSIS — M7062 Trochanteric bursitis, left hip: Secondary | ICD-10-CM

## 2013-09-01 DIAGNOSIS — I10 Essential (primary) hypertension: Secondary | ICD-10-CM | POA: Diagnosis not present

## 2013-09-01 DIAGNOSIS — D719 Functional disorders of polymorphonuclear neutrophils, unspecified: Secondary | ICD-10-CM

## 2013-09-01 DIAGNOSIS — M23205 Derangement of unspecified medial meniscus due to old tear or injury, unspecified knee: Secondary | ICD-10-CM

## 2013-09-01 DIAGNOSIS — C439 Malignant melanoma of skin, unspecified: Secondary | ICD-10-CM

## 2013-09-01 DIAGNOSIS — N281 Cyst of kidney, acquired: Secondary | ICD-10-CM

## 2013-09-01 DIAGNOSIS — C61 Malignant neoplasm of prostate: Secondary | ICD-10-CM

## 2013-09-01 DIAGNOSIS — Z0000 Encounter for general adult medical examination without abnormal findings: Secondary | ICD-10-CM | POA: Diagnosis not present

## 2013-09-01 NOTE — Patient Instructions (Signed)
Today we updated your med list in our EPIC system...    Continue your current medications the same...  Continue the Omeprazole from [facility] for your vocal cords...  We discussed increasing the SIMVASTATIN back to 20mg  per day for your cholesterol...  Your follow up CXR is unchanged, no acute abnormality; and your EKG is WNL...  Call if we can be of service in any way.Marland KitchenMarland Kitchen

## 2013-09-02 ENCOUNTER — Ambulatory Visit: Payer: Medicare Other | Admitting: Sports Medicine

## 2013-09-06 NOTE — Progress Notes (Signed)
Subjective:     Patient ID: Darren Edwards, male   DOB: 1929/04/01, 79 y.o.   MRN: [id number]  HPI 79 y/o WM here for a follow up visit...   ~  Mar10:  he has no complaints or concerns today and states "I'm doing super"... he is very athletic and runs 4-5 mi 3x/week, does 5K races, and does an exercise bike 3x/week... he is followed at the [facility] yearly and notes that he gets his Zocor for free there...  ~  June 20, 2010:  59mo ROV- "just super" he says... again w/o complaints or concerns, but he is concerned about what he has read about "statins and ALS"> it is his desire to stop his Simvastatin (currently taking 1/2 of the 40mg  pill) and treat his hypercholesterolemia w/ diet & exercise alone... I reminded him of his pos family hx for heart disease (risk factor) but he is undeterred & wants to stop the Zocor Rx... he had fasting labs 3/11 & these are reviewed+ all essent WNL & his PSA remains 0.00 s/p prostate surg 2006 as below...  He had colonoscopy by [facility] 4/11- states his wife made him go due to bl in stool & colon was neg x for 4mm polyp (bx= polypopid mucosa) & int hems...   He saw DrFields 3/11 w/ plantar fasciitis- he felt it was longitudinal arch strain, given arch support & exercises- improved...  ~  September 01, 2013:  3year ROV & [name] keeps regular f/u w/ DrFields/ ortho due to his running; he reports that he fell off a ladder ~32yr ago w/ bruised left knee, meniscus tear, injured ankle, left hip discomfort- all tended by Drfields w/ shot that helped; he tells me that he's researched Synvisc and saw DrMartin, Ortho at [facility] w/ MRI done (we don't have these records- but pt indicates that he does Robotic surg), he is holding off on the Synvisc for now & doing exercises...     Hoarseness, VC nodule> followed by DrShoemaker/ENT on antireflux regimen & Omep40Qd; he nreports improved & they are holding off on VC nodule surg since the reflux regimen is helping...    Old granulomatous dis, Hx AB>  on no regular meds and "doing super" he says; denies cough, sput, hemoptysis, SOB, etc...    Hx HBP & FamHx CAD> on meds transiently in the past, he prefers diet & exercise; BP= 140/74 & he denies CP, palpit, SOB, dizzy, edema, etc; he runs regularly "I'm a healthy robust specimen"...    CHOL> states he's followed at the [facility] for this- seen once yearly, prev on Simva20 & they decr to 10mg /d (?if taking); FLP 9/14 showed TChol 199, TG 43, HDL 54, LDL 137 & he is rec to incr back to 20mg /d...    GI- Divertics & colon polyp> last colon 4/11 by DrKaplan w/ 4mm sessile polyp & bx= benign mucosa...    GU- prostate cancer> Dx 2006 by drTannenbaum, he went to [facility] for radical prostatectomy & PSA has remained zero since then; Labs 9/14 showed PSA= 0.00    DJD & DDD> he is followed by DrFields for Ortho/ sports med & checked very often; he stopped running 5/14 due to knees & now uses [employer]irdyne bike...     Hx Melanoma> stage 1 MM removed from his back 6/05 by [name]; he continues to f/u w/ derm every 65mo... We reviewed prob list, meds, xrays and labs> see below for updates >> he had fasting labs done 9/14 here... He had the  2014 Flu vaccine last month... CXR 11/14 showed norm heart size, clear lungs, ?sl hyperinflated, NAD.Marland KitchenMarland Kitchen EKG 11/14 showed NSR, rate72, WNL, NAD...  LABS 9/14:  FLP- ok x LDL=137 & he stopped prev Simva rx- prefers diet alone;  Chems- wnl;  CBC- wnl;  TSH=2.10; PSA=0.00 Note>> pt had labs done at the [facility] 4/14 & provided copies to be scanned into Epic...           Problem List:   HOARSENESS - he has a hoarse, sl weak voice and he notes that he is not able to read aloud for very long... we discussed the need for ENT eval to check his cords & r/o poss lesion... he notes occas reflux symptoms intermittently for 66yrs he says> advised to take OTC PRILOSEC 20mg /d prior to 1st meal of the day. ~  8/14:  Latest ENT f/u by DrShowmaker> VC nodule, LPR, chr hoarseness & raspy voice- reflux, cough,  throat clearing; on Omep40 after dinner Qd; Laryngoscopy w/ some VC atrophy, right VC nodule, norm mobility, some erythema; DrShoemaker wanted to remove the nodule but pt says subseq visits showed antireflux regimen & Omep were helping so they have put this off & he continues to follow...   Hx of ASTHMATIC BRONCHITIS, ACUTE (ICD-466.0)  -  hx AB treated w/ Ab's and Advair transiently in 2003... no recurrent symptoms.  CHRONIC GRANULOMATOUS DISEASE (ICD-288.1)  -  old CXR & CTChest 1/07 showed calcif gran in RML and calcif in right hilum and in spleen, no clinical hx of Tb or known exposure... ~  f/u CXR 8/11 showed no change in the calcif mediastinal & hilar LNs, stable. ~  11/14: on no regular meds and "doing super" he says; denies cough, sput, hemoptysis, SOB, etc... ~  CXR 11/14 showed norm heart size, clear lungs, ?sl hyperinflated, NAD  Hx of HYPERTENSION (ICD-401.9)  -  transient elevated BP in the past prev rx w/ Lisinopril 10mg /d... pt prefers diet & exerc and BP's have been fine in recent years... BP= 124/76 today and averages 120's/ 60's at home... denies HA, fatigue, visual changes, CP, palipit, dizziness, syncope, dyspnea, edema, etc...  ~  11/14: on meds transiently in the past, he prefers diet & exercise; BP= 140/74 & he denies CP, palpit, SOB, dizzy, edema, etc; he runs regularly "I'm a healthy robust specimen"   Family Hx of CORONARY ARTERY DISEASE (ICD-414.00)  -  he takes ASA 81mg /d... Father had hx CAD, & Brother w/ MI... pt had cath years ago which was reportedly normal... NuclearStressTest 12/03 and again 3/07 were normal- no ischemia, EF=62%...  HYPERCHOLESTEROLEMIA (ICD-272.0)  -  on SIMVASTATIN 40mg tabs- 1/2 tab daily + CoQ 10 OTC... ~  FLP 2/09 showed TChol 160, TG 42, HDL 54, LDL 98 ~  FLP 2/10 showed TChol 163, TG 42, HDL 51, LDL 104... he prefers to continue same Rx. ~  FLP 3/11 showed TChol 148, TG 41, HDL 53, LDL 87 ~  8/11: he is concerned about what he has read  regarding statins and ALS- he wants to stop his Simva & do the best he can w/o medication on diet alone... ~  11/14: states he's followed at the [facility] for this- seen once yearly, prev on Simva20 & they decr to 10mg /d (?if taking); FLP 9/14 showed TChol 199, TG 43, HDL 54, LDL 137 & he is rec to incr back to 20mg /d   DIVERTICULOSIS OF COLON (ICD-562.10)  -  he had colonoscopy was 2/01 by [facility] &  neg x divertics... ~  4/11: he states that his wife made him ret to GI due to blood in stool & repeat colonoscopy 4/11 showed 4mm polyp (bx= polypoid mucosa) & int hems...  ADENOCARCINOMA, PROSTATE (ICD-185)  -  Dx 2006 by DrTannenbaum... he went to [facility] for radical prostatectomy surg... ~  labs 2/10 showed PSA= 0.01 ~  labs 3/11 showed PSA= 0.00 ~  Labs 9/14 showed PSA= 0.00  RENAL CYST (ICD-593.2)  -  CTscan has revealed a 3-4cm right renal cyst (upper pole)...  OSTEOARTHRITIS >>  DEGENERATIVE DISC DISEASE (ICD-722.6)  -  periodic evaluations from "sports medicine" DrDaldorf & DrFields for PT and exercises to keep him running... 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 ~  11/14:  Epic review shows approx 10 visits w/ DrFields over the past yr- notes reviewed; pt also indicates eval at [facility], DrMartin in Ortho for poss robotic surg on knee but he is holding off; pt has also researched Synvisc & considering this...   Hx of MALIGNANT MELANOMA (ICD-172.9)  -  stage I MM removed from his back in 6/05 by DrNolan... he continues to f/u w/ Derm every 6 months (now [facility]). 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  Health Maintenance:  he takes various nutrients & supplements "to prevent diabetes" ~  GI:  DrKaplan w/ colonoscopy 4/11 as above. ~  GU:  DrTannenbaum and [facility]>  we do his yearly PSA & it has remained "zero" since surg. ~  Immunizations:  he gets the yearly seasonal Flu vaccine each fall;  he had the PNEUMOVAX in  2010 (age 43);  we discussed the 10 yr Tetanus vaccine;  he inquired about the Shingles vaccine & is [facility] for this.   Past Medical History  Diagnosis Date  . Hypercholesteremia   HOARSENESS (ICD-784.42) ALLERGIC RHINITIS (ICD-477.9) Hx of ASTHMATIC BRONCHITIS, ACUTE (ICD-466.0) CHRONIC GRANULOMATOUS DISEASE (ICD-288.1) Hx of HYPERTENSION (ICD-401.9) Family Hx of CORONARY ARTERY DISEASE (ICD-414.00) HYPERCHOLESTEROLEMIA (ICD-272.0) DIVERTICULOSIS OF COLON (ICD-562.10) ADENOCARCINOMA, PROSTATE (ICD-185) RENAL CYST (ICD-593.2) DEGENERATIVE DISC DISEASE (ICD-722.6) Hx of MALIGNANT MELANOMA (ICD-172.9)   Past Surgical History  Procedure Laterality Date  . Tonsillectomy    . Prostatectomy    S/P T & A S/P vasectomy S/P radical prostatectomy melenoma excised from back   Outpatient Encounter Prescriptions as of 09/01/2013  Medication Sig  . aspirin 81 MG tablet Take 81 mg by mouth daily.  Marland Kitchen omeprazole (PRILOSEC) 40 MG capsule Take 40 mg by mouth daily.  . simvastatin (ZOCOR) 20 MG tablet Take 10 mg by mouth every evening.   . Undecylenic Ac-Zn Undecylenate ([employer] ANTIFUNGAL MAXIMUM STR) 5-20 % OINT Apply 1 application topically daily.  . [DISCONTINUED] diclofenac sodium (VOLTAREN) 1 % GEL Apply 2 g topically 4 (four) times daily as needed.  . [DISCONTINUED] traMADol (ULTRAM) 50 MG tablet Take 1 tablet (50 mg total) by mouth 2 (two) times daily.    Allergies  Allergen Reactions  . Penicillins     REACTION: rash    Family History: Reviewed history from 8/11 and no changes required. heart disease - father had MI, brother had CABG cancer - sister (lung), father (lung) Family History of Ovarian Cancer: Mother No FH of Colon Cancer:   Social History: Reviewed history from 8/11 and no changes required. never smoked drinks wine 4-5 times a week  married 5 children avid runner retired - [employer] Smoking Status:  never   Current  Medications, Allergies, Past Medical History, Past Surgical History, Family History, and Social History were reviewed in [employer] record.   Review of Systems    The patient denies fever, chills, sweats, anorexia, fatigue, weakness, malaise, weight loss, sleep disorder, blurring, diplopia, eye irritation, eye discharge, vision loss, eye pain, photophobia, earache, ear discharge, tinnitus, decreased hearing, nasal congestion, nosebleeds, sore throat, hoarseness, chest pain, palpitations, syncope, dyspnea on exertion, orthopnea, PND, peripheral edema, cough, dyspnea at rest, excessive sputum, hemoptysis, wheezing, pleurisy, nausea, vomiting, diarrhea, constipation, change in bowel habits, abdominal pain, melena, hematochezia, jaundice, gas/bloating, indigestion/heartburn, dysphagia, odynophagia, dysuria, hematuria, urinary frequency, urinary hesitancy, nocturia, incontinence, back pain, joint pain, joint swelling, muscle cramps, muscle weakness, stiffness, arthritis, sciatica, restless legs, leg pain at night, leg pain with exertion, rash, itching, dryness, suspicious lesions, paralysis, paresthesias, seizures, tremors, vertigo, transient blindness, frequent falls, frequent headaches, difficulty walking, depression, anxiety, memory loss, confusion, cold intolerance, heat intolerance, polydipsia, polyphagia, polyuria, unusual weight change, abnormal bruising, bleeding, enlarged lymph nodes, urticaria, allergic rash, hay fever, and recurrent infections.     Objective:   Physical Exam     WD, WN, 79 y/o WM in NAD... GENERAL:  Alert & oriented; pleasant & cooperative... HEENT:  Chandler/AT, EOM-wnl, PERRLA, Fundi-benign, EACs-clear, TMs-wnl, NOSE-clear, THROAT-clear & wnl, but voice is chronically hoarse & sl weak. NECK:  Supple w/ fairROM; no JVD; normal carotid impulses w/o bruits; no thyromegaly or nodules palpated; no lymphadenopathy. CHEST:  Clear to P & A; without wheezes/ rales/ or  rhonchi. HEART:  Regular Rhythm; without murmurs/ rubs/ or gallops. ABDOMEN:  Soft & nontender; normal bowel sounds; no organomegaly or masses detected.  RECTAL:  Neg - prostate flat/ neg; stool hematest neg. EXT: without deformities, mild arthritic changes; no varicose veins/ venous insuffic/ or edema. NEURO:  CN's intact; motor testing normal; sensory testing normal; gait normal & balance OK. DERM:  No lesions noted; no rash etc...  RADIOLOGY DATA:  Reviewed in the EPIC EMR & discussed w/ the patient...  LABORATORY DATA:  Reviewed in the EPIC EMR & discussed w/ the patient...   Assessment:      Hoarseness, VC nodule> followed by DrShoemaker/ENT on antireflux regimen & Omep40Qd; he reports improved & they are holding off on VC nodule surg since the reflux regimen is helping...  Old granulomatous dis, Hx AB> on no regular meds and "doing super" he says...  Hx HBP & FamHx CAD> on meds transiently in the past, he prefers diet & exercise...  CHOL> states he's followed at the [facility] for this- seen once yearly, prev on Simva20 & they decr to 10mg /d but LDL 137 & he is rec to incr back to 20mg /d...  GI- Divertics & colon polyp> last colon 4/11 by DrKaplan w/ 4mm sessile polyp & bx= benign mucosa...  GU- prostate cancer> Dx 2006 by DrTannenbaum, he went to [facility] for radical prostatectomy & PSA has remained zero since then...  DJD & DDD> he is followed by DrFields for Ortho/ sports med & checked very often; he stopped running 5/14 due to knees & now uses [employer] bike...   Hx Melanoma> stage 1 MM removed from his back 6/05 by [name]; he continues to f/u w/ derm every 19mo...     Plan:     Patient's Medications  New Prescriptions   No medications on file  Previous Medications   ASPIRIN 81 MG TABLET  Take 81 mg by mouth daily.   OMEPRAZOLE (PRILOSEC) 40 MG CAPSULE    Take 40 mg by mouth daily.   SIMVASTATIN (ZOCOR) 20 MG TABLET    Take 10 mg by mouth every evening.    UNDECYLENIC AC-ZN  UNDECYLENATE ([employer]S ANTIFUNGAL MAXIMUM STR) 5-20 % OINT    Apply 1 application topically daily.  Modified Medications   No medications on file  Discontinued Medications   DICLOFENAC SODIUM (VOLTAREN) 1 % GEL    Apply 2 g topically 4 (four) times daily as needed.   TRAMADOL (ULTRAM) 50 MG TABLET    Take 1 tablet (50 mg total) by mouth 2 (two) times daily.

## 2013-09-28 ENCOUNTER — Ambulatory Visit: Payer: Medicare Other | Admitting: Sports Medicine

## 2013-09-28 ENCOUNTER — Ambulatory Visit (INDEPENDENT_AMBULATORY_CARE_PROVIDER_SITE_OTHER): Payer: Medicare Other | Admitting: Sports Medicine

## 2013-09-28 VITALS — BP 156/81 | Ht 69.0 in | Wt 136.0 lb

## 2013-09-28 DIAGNOSIS — J209 Acute bronchitis, unspecified: Secondary | ICD-10-CM | POA: Diagnosis not present

## 2013-09-28 DIAGNOSIS — M25559 Pain in unspecified hip: Secondary | ICD-10-CM | POA: Diagnosis not present

## 2013-09-28 DIAGNOSIS — M76899 Other specified enthesopathies of unspecified lower limb, excluding foot: Secondary | ICD-10-CM | POA: Diagnosis not present

## 2013-09-28 DIAGNOSIS — S81009A Unspecified open wound, unspecified knee, initial encounter: Secondary | ICD-10-CM | POA: Diagnosis not present

## 2013-09-28 DIAGNOSIS — IMO0002 Reserved for concepts with insufficient information to code with codable children: Secondary | ICD-10-CM | POA: Diagnosis not present

## 2013-09-28 DIAGNOSIS — M7040 Prepatellar bursitis, unspecified knee: Secondary | ICD-10-CM | POA: Diagnosis not present

## 2013-09-28 DIAGNOSIS — M25552 Pain in left hip: Secondary | ICD-10-CM

## 2013-09-28 DIAGNOSIS — M23204 Derangement of unspecified medial meniscus due to old tear or injury, left knee: Secondary | ICD-10-CM

## 2013-09-28 DIAGNOSIS — M25562 Pain in left knee: Secondary | ICD-10-CM

## 2013-09-28 DIAGNOSIS — M25569 Pain in unspecified knee: Secondary | ICD-10-CM

## 2013-09-28 NOTE — Assessment & Plan Note (Signed)
Patient's hip discomfort appears to be coming from the tendons attaching to the trochanter. Offered Nitro patches to promote healing.  Patient wants to wait at this time.  Will send to PT to optimize functionality and try to get him improved enough to return back to running.

## 2013-09-28 NOTE — Progress Notes (Signed)
   Subjective:    Patient ID: Darren Edwards, male    DOB: 11-30-1928, 79 y.o.   MRN: [id number]  HPI Darren Edwards returns today for followup visit for his left partial medial meniscus tear as well as recurrence of left hip pain.  Knee Pain: Left  Patient reports some improvement in pain. He continues to bike ~4x/week.  He has no pain when biking.  He takes no medication for his pain/discomfort.     Hip pain: Left He states that his hip pain is not improved, despite prior injection.  Pain is moderate to severe, especially after sitting for a period of time.  He does not endorse any pain with physical activity (biking or walking.  When pain occurs it is located below the trochanter and slightly medial.  He notes some improvement with icing, which he does 2x/day as needed.   Review of Systems Per HPI    Objective:   Physical Exam Filed Vitals:   09/28/13 1526  BP: 156/81   General: well appearing elderly gentleman in NAD. MSK: Knee: Normal to inspection with no erythema or effusion.  Palpation normal with no warmth, joint line tenderness. ROM full in flexion and extension and lower leg rotation. Some crepitus noted.  Ligaments with solid consistent endpoints including ACL, PCL, LCL, MCL. Negative Mcmurray's.  Patellar glide without crepitus. Patellar and quadriceps tendons unremarkable. Hamstring and quadriceps strength is normal.   Hip: ROM Full ROM except with external rotation (decreased bilaterally) Strength IR: 5/5, ER: 5/5, Flexion: 5/5, Extension: 5/5, Abduction: 4/5 Greater trochanter without tenderness to palpation. Patient does have pain below the trochanter along insertion of hip rotators No pain with FABER.  No SI joint tenderness.    Assessment & Plan:  See Problem List  Everlene Other DO Family Medicine PGY-2

## 2013-09-28 NOTE — Assessment & Plan Note (Signed)
This has improved with biking for rehab  I'm not sure he will get enough relief to return to running as he has degenerative meniscal tearing  Will see how he responds to PT  Reck after 6 to 8 sessions

## 2013-09-28 NOTE — Patient Instructions (Signed)
It was nice to see you today.  I have put in an order for Physical therapy.  If you have not heard from them in ~ 2 days please call.   [facility]  Physical Therapy Clinic  Address: [address] [place], [place], [place] [address]  [phone]

## 2013-09-28 NOTE — Assessment & Plan Note (Signed)
Patient slowly improving. Patient to continue daily exercise. No indication for injection at this time. Will place referral to PT today.

## 2013-09-29 ENCOUNTER — Other Ambulatory Visit: Payer: Self-pay | Admitting: Dermatology

## 2013-09-29 DIAGNOSIS — L570 Actinic keratosis: Secondary | ICD-10-CM | POA: Diagnosis not present

## 2013-09-29 DIAGNOSIS — C44221 Squamous cell carcinoma of skin of unspecified ear and external auricular canal: Secondary | ICD-10-CM | POA: Diagnosis not present

## 2013-09-30 ENCOUNTER — Ambulatory Visit: Payer: Medicare Other | Attending: Family Medicine | Admitting: Physical Therapy

## 2013-09-30 DIAGNOSIS — M25659 Stiffness of unspecified hip, not elsewhere classified: Secondary | ICD-10-CM | POA: Diagnosis not present

## 2013-09-30 DIAGNOSIS — M25559 Pain in unspecified hip: Secondary | ICD-10-CM | POA: Diagnosis not present

## 2013-09-30 DIAGNOSIS — M25569 Pain in unspecified knee: Secondary | ICD-10-CM | POA: Diagnosis not present

## 2013-09-30 DIAGNOSIS — IMO0001 Reserved for inherently not codable concepts without codable children: Secondary | ICD-10-CM | POA: Insufficient documentation

## 2013-09-30 DIAGNOSIS — R293 Abnormal posture: Secondary | ICD-10-CM | POA: Diagnosis not present

## 2013-10-04 ENCOUNTER — Ambulatory Visit: Payer: Medicare Other | Admitting: Rehabilitation

## 2013-10-04 DIAGNOSIS — M25659 Stiffness of unspecified hip, not elsewhere classified: Secondary | ICD-10-CM | POA: Diagnosis not present

## 2013-10-04 DIAGNOSIS — M25569 Pain in unspecified knee: Secondary | ICD-10-CM | POA: Diagnosis not present

## 2013-10-04 DIAGNOSIS — R293 Abnormal posture: Secondary | ICD-10-CM | POA: Diagnosis not present

## 2013-10-04 DIAGNOSIS — M25559 Pain in unspecified hip: Secondary | ICD-10-CM | POA: Diagnosis not present

## 2013-10-04 DIAGNOSIS — IMO0001 Reserved for inherently not codable concepts without codable children: Secondary | ICD-10-CM | POA: Diagnosis not present

## 2013-10-05 ENCOUNTER — Ambulatory Visit: Payer: Medicare Other | Admitting: Rehabilitation

## 2013-10-05 DIAGNOSIS — IMO0001 Reserved for inherently not codable concepts without codable children: Secondary | ICD-10-CM | POA: Diagnosis not present

## 2013-10-05 DIAGNOSIS — M25659 Stiffness of unspecified hip, not elsewhere classified: Secondary | ICD-10-CM | POA: Diagnosis not present

## 2013-10-05 DIAGNOSIS — R293 Abnormal posture: Secondary | ICD-10-CM | POA: Diagnosis not present

## 2013-10-05 DIAGNOSIS — M25559 Pain in unspecified hip: Secondary | ICD-10-CM | POA: Diagnosis not present

## 2013-10-05 DIAGNOSIS — M25569 Pain in unspecified knee: Secondary | ICD-10-CM | POA: Diagnosis not present

## 2013-10-11 ENCOUNTER — Ambulatory Visit: Payer: Medicare Other | Admitting: Physical Therapy

## 2013-10-11 DIAGNOSIS — IMO0001 Reserved for inherently not codable concepts without codable children: Secondary | ICD-10-CM | POA: Diagnosis not present

## 2013-10-11 DIAGNOSIS — M25659 Stiffness of unspecified hip, not elsewhere classified: Secondary | ICD-10-CM | POA: Diagnosis not present

## 2013-10-11 DIAGNOSIS — M25569 Pain in unspecified knee: Secondary | ICD-10-CM | POA: Diagnosis not present

## 2013-10-11 DIAGNOSIS — M25559 Pain in unspecified hip: Secondary | ICD-10-CM | POA: Diagnosis not present

## 2013-10-11 DIAGNOSIS — R293 Abnormal posture: Secondary | ICD-10-CM | POA: Diagnosis not present

## 2013-10-12 ENCOUNTER — Ambulatory Visit: Payer: Medicare Other | Admitting: Rehabilitation

## 2013-10-13 ENCOUNTER — Encounter: Payer: Medicare Other | Admitting: Physical Therapy

## 2013-10-19 ENCOUNTER — Encounter: Payer: Medicare Other | Admitting: Rehabilitation

## 2013-11-02 DIAGNOSIS — J370 Chronic laryngitis: Secondary | ICD-10-CM | POA: Diagnosis not present

## 2013-11-02 DIAGNOSIS — K219 Gastro-esophageal reflux disease without esophagitis: Secondary | ICD-10-CM | POA: Diagnosis not present

## 2013-11-02 DIAGNOSIS — J383 Other diseases of vocal cords: Secondary | ICD-10-CM | POA: Diagnosis not present

## 2013-11-18 DIAGNOSIS — Z85828 Personal history of other malignant neoplasm of skin: Secondary | ICD-10-CM | POA: Diagnosis not present

## 2014-03-10 DIAGNOSIS — L819 Disorder of pigmentation, unspecified: Secondary | ICD-10-CM | POA: Diagnosis not present

## 2014-03-10 DIAGNOSIS — D235 Other benign neoplasm of skin of trunk: Secondary | ICD-10-CM | POA: Diagnosis not present

## 2014-03-10 DIAGNOSIS — L570 Actinic keratosis: Secondary | ICD-10-CM | POA: Diagnosis not present

## 2014-03-10 DIAGNOSIS — D485 Neoplasm of uncertain behavior of skin: Secondary | ICD-10-CM | POA: Diagnosis not present

## 2014-05-02 DIAGNOSIS — J301 Allergic rhinitis due to pollen: Secondary | ICD-10-CM | POA: Diagnosis not present

## 2014-05-02 DIAGNOSIS — J370 Chronic laryngitis: Secondary | ICD-10-CM | POA: Diagnosis not present

## 2014-05-02 DIAGNOSIS — K219 Gastro-esophageal reflux disease without esophagitis: Secondary | ICD-10-CM | POA: Diagnosis not present

## 2014-05-02 DIAGNOSIS — J383 Other diseases of vocal cords: Secondary | ICD-10-CM | POA: Diagnosis not present

## 2014-05-10 DIAGNOSIS — R609 Edema, unspecified: Secondary | ICD-10-CM | POA: Diagnosis not present

## 2014-05-10 DIAGNOSIS — H2510 Age-related nuclear cataract, unspecified eye: Secondary | ICD-10-CM | POA: Diagnosis not present

## 2014-05-12 DIAGNOSIS — R21 Rash and other nonspecific skin eruption: Secondary | ICD-10-CM | POA: Diagnosis not present

## 2014-05-16 DIAGNOSIS — L259 Unspecified contact dermatitis, unspecified cause: Secondary | ICD-10-CM | POA: Diagnosis not present

## 2014-05-16 DIAGNOSIS — L739 Follicular disorder, unspecified: Secondary | ICD-10-CM | POA: Diagnosis not present

## 2014-05-16 DIAGNOSIS — L570 Actinic keratosis: Secondary | ICD-10-CM | POA: Diagnosis not present

## 2014-05-16 DIAGNOSIS — D485 Neoplasm of uncertain behavior of skin: Secondary | ICD-10-CM | POA: Diagnosis not present

## 2014-05-31 DIAGNOSIS — M1710 Unilateral primary osteoarthritis, unspecified knee: Secondary | ICD-10-CM | POA: Diagnosis not present

## 2014-07-14 DIAGNOSIS — Z23 Encounter for immunization: Secondary | ICD-10-CM | POA: Diagnosis not present

## 2014-08-08 ENCOUNTER — Encounter: Payer: Self-pay | Admitting: Gastroenterology

## 2014-08-11 ENCOUNTER — Encounter: Payer: Self-pay | Admitting: Gastroenterology

## 2014-08-11 ENCOUNTER — Ambulatory Visit (INDEPENDENT_AMBULATORY_CARE_PROVIDER_SITE_OTHER): Payer: Medicare Other | Admitting: Gastroenterology

## 2014-08-11 VITALS — BP 168/72 | HR 76 | Ht 68.5 in | Wt 133.2 lb

## 2014-08-11 DIAGNOSIS — K644 Residual hemorrhoidal skin tags: Secondary | ICD-10-CM | POA: Insufficient documentation

## 2014-08-11 DIAGNOSIS — K648 Other hemorrhoids: Secondary | ICD-10-CM

## 2014-08-11 MED ORDER — HYDROCORTISONE 2.5 % RE CREA: 1.0000 "application " | TOPICAL_CREAM | Freq: Two times a day (BID) | RECTAL | Status: DC

## 2014-08-11 NOTE — Patient Instructions (Signed)
We have sent the following medications to your pharmacy for you to pick up at your convenience: Hydrocortisone Cream 2.5%, please apply to rectum twice daily   Information on Sitz Bath is below for your review ____________________________________________________________________  Pearson Grippe A sitz bath is a warm water bath taken in the sitting position that covers only the hips and buttocks. It may be used for either healing or hygiene purposes. Sitz baths are also used to relieve pain, itching, or muscle spasms. The water may contain medicine. Moist heat will help you heal and relax.  HOME CARE INSTRUCTIONS  Take 3 to 4 sitz baths a day. 1. Fill the bathtub half full with warm water. 2. Sit in the water and open the drain a little. 3. Turn on the warm water to keep the tub half full. Keep the water running constantly. 4. Soak in the water for 15 to 20 minutes. 5. After the sitz bath, pat the affected area dry first. SEEK MEDICAL CARE IF:  You get worse instead of better. Stop the sitz baths if you get worse. MAKE SURE YOU:  Understand these instructions.  Will watch your condition.  Will get help right away if you are not doing well or get worse. Document Released: 07/06/2004 Document Revised: 07/08/2012 Document Reviewed: 01/11/2011 [facility] Patient Information 2015 [place], [place]. This information is not intended to replace advice given to you by your health care provider. Make sure you discuss any questions you have with your health care provider.

## 2014-08-11 NOTE — Progress Notes (Signed)
     08/11/2014 Darren Edwards [id number] 1929/10/01   HISTORY OF PRESENT ILLNESS:  This is an 80 year old male who is previously known to Dr. Deatra Ina for colonoscopy in April 2011. At that time his colonoscopy showed a 4 mm sessile polyp in the ascending colon that was benign polypoid colorectal mucosa on pathology. He was also found to have internal hemorrhoids at that time and was treated with Anusol suppositories for rectal bleeding.  He presents to our office today with complaints of rectal bleeding described as bright red blood seen upon wiping for the past week or so. He says that he rides a stationary bike for 45 minutes every day and the bleeding started after exercising on the bike one day. His wife examined his bottom and describes what she calls a skin tag near his anus. She says that there also appeared to be a rash for a day or two, but that has resolved. He denies any abdominal pain or change in bowel habits. He admits that on occasion he does have to strain to move his bowels, but otherwise his bowels move regularly.    Past Medical History  Diagnosis Date  . Hypercholesteremia    Past Surgical History  Procedure Laterality Date  . Tonsillectomy    . Prostatectomy    . Colonoscopy  2011    w/Dr. Deatra Ina    reports that he has never smoked. He has never used smokeless tobacco. He reports that he drinks alcohol. He reports that he does not use illicit drugs. family history is negative for Colon cancer, Colon polyps, Diabetes, and Esophageal cancer. Allergies  Allergen Reactions  . Penicillins     REACTION: rash      Outpatient Encounter Prescriptions as of 08/11/2014  Medication Sig  . aspirin 81 MG tablet Take 81 mg by mouth daily.  Marland Kitchen omeprazole (PRILOSEC) 40 MG capsule Take 40 mg by mouth daily.  . simvastatin (ZOCOR) 20 MG tablet Take 10 mg by mouth every evening.   . Undecylenic Ac-Zn Undecylenate ([employer]S ANTIFUNGAL MAXIMUM STR) 5-20 % OINT Apply 1 application  topically daily.  . hydrocortisone (ANUSOL-HC) 2.5 % rectal cream Place 1 application rectally 2 (two) times daily.     REVIEW OF SYSTEMS  : All other systems reviewed and negative except where noted in the History of Present Illness.   PHYSICAL EXAM: BP 168/72  Pulse 76  Ht 5' 8.5" (1.74 m)  Wt 133 lb 4 oz (60.442 kg)  BMI 19.96 kg/m2 General: Well developed white male in no acute distress Head: Normocephalic and atraumatic Eyes:  Sclerae anicteric, conjunctiva pink. Ears: Normal auditory acuity  Rectal:  Anterior skin tag noted as well as an external hemorrhoid that was inflamed and with stigmata of recent bleeding.  Light brown, heme negative stool on exam glove. Musculoskeletal: Symmetrical with no gross deformities  Skin: No lesions on visible extremities Extremities: No edema  Neurological: Alert oriented x 4, grossly non-focal Psychological:  Alert and cooperative. Normal mood and affect  ASSESSMENT AND PLAN: -Rectal bleeding secondary to external hemorrhoid:  Will treat with hydrocortisone cream 2.5% BID for 7-10 days.  Will also give rectal care instructions for sitz baths, etc.

## 2014-08-12 NOTE — Progress Notes (Signed)
Reviewed and agree with management.  If bleeding is recurrent would consider band ligation of internal hemorrhoid Sandy Salaam. Deatra Ina, M.D., [facility]

## 2014-09-08 ENCOUNTER — Other Ambulatory Visit: Payer: Self-pay

## 2014-09-08 DIAGNOSIS — L570 Actinic keratosis: Secondary | ICD-10-CM | POA: Diagnosis not present

## 2014-09-08 DIAGNOSIS — C4449 Other specified malignant neoplasm of skin of scalp and neck: Secondary | ICD-10-CM | POA: Diagnosis not present

## 2014-09-08 DIAGNOSIS — C4442 Squamous cell carcinoma of skin of scalp and neck: Secondary | ICD-10-CM | POA: Diagnosis not present

## 2014-10-26 DIAGNOSIS — L570 Actinic keratosis: Secondary | ICD-10-CM | POA: Diagnosis not present

## 2014-10-26 DIAGNOSIS — Z85828 Personal history of other malignant neoplasm of skin: Secondary | ICD-10-CM | POA: Diagnosis not present

## 2014-10-26 DIAGNOSIS — L821 Other seborrheic keratosis: Secondary | ICD-10-CM | POA: Diagnosis not present

## 2014-10-26 DIAGNOSIS — Z08 Encounter for follow-up examination after completed treatment for malignant neoplasm: Secondary | ICD-10-CM | POA: Diagnosis not present

## 2014-11-22 ENCOUNTER — Other Ambulatory Visit: Payer: Self-pay | Admitting: Dermatology

## 2014-11-22 DIAGNOSIS — C44629 Squamous cell carcinoma of skin of left upper limb, including shoulder: Secondary | ICD-10-CM | POA: Diagnosis not present

## 2014-11-22 DIAGNOSIS — L570 Actinic keratosis: Secondary | ICD-10-CM | POA: Diagnosis not present

## 2015-01-04 DIAGNOSIS — Z85828 Personal history of other malignant neoplasm of skin: Secondary | ICD-10-CM | POA: Diagnosis not present

## 2015-01-04 DIAGNOSIS — L814 Other melanin hyperpigmentation: Secondary | ICD-10-CM | POA: Diagnosis not present

## 2015-01-04 DIAGNOSIS — L570 Actinic keratosis: Secondary | ICD-10-CM | POA: Diagnosis not present

## 2015-01-04 DIAGNOSIS — Z08 Encounter for follow-up examination after completed treatment for malignant neoplasm: Secondary | ICD-10-CM | POA: Diagnosis not present

## 2015-01-04 DIAGNOSIS — D225 Melanocytic nevi of trunk: Secondary | ICD-10-CM | POA: Diagnosis not present

## 2015-02-08 DIAGNOSIS — L570 Actinic keratosis: Secondary | ICD-10-CM | POA: Diagnosis not present

## 2015-02-08 DIAGNOSIS — L821 Other seborrheic keratosis: Secondary | ICD-10-CM | POA: Diagnosis not present

## 2015-02-08 DIAGNOSIS — D485 Neoplasm of uncertain behavior of skin: Secondary | ICD-10-CM | POA: Diagnosis not present

## 2015-05-10 DIAGNOSIS — J370 Chronic laryngitis: Secondary | ICD-10-CM | POA: Diagnosis not present

## 2015-05-10 DIAGNOSIS — K219 Gastro-esophageal reflux disease without esophagitis: Secondary | ICD-10-CM | POA: Diagnosis not present

## 2015-05-10 DIAGNOSIS — J382 Nodules of vocal cords: Secondary | ICD-10-CM | POA: Diagnosis not present

## 2015-06-01 DIAGNOSIS — M1712 Unilateral primary osteoarthritis, left knee: Secondary | ICD-10-CM | POA: Diagnosis not present

## 2015-06-06 DIAGNOSIS — L570 Actinic keratosis: Secondary | ICD-10-CM | POA: Diagnosis not present

## 2015-06-06 DIAGNOSIS — L820 Inflamed seborrheic keratosis: Secondary | ICD-10-CM | POA: Diagnosis not present

## 2015-06-06 DIAGNOSIS — D225 Melanocytic nevi of trunk: Secondary | ICD-10-CM | POA: Diagnosis not present

## 2015-07-31 DIAGNOSIS — M25562 Pain in left knee: Secondary | ICD-10-CM | POA: Diagnosis not present

## 2015-07-31 DIAGNOSIS — M1712 Unilateral primary osteoarthritis, left knee: Secondary | ICD-10-CM | POA: Diagnosis not present

## 2015-08-03 IMAGING — CR DG CHEST 2V
2 series · 2 of 2 positions shown · non-contrast
Comparison: Radiograph 06/20/2010

CLINICAL DATA: routine check-up

EXAM:
CHEST  2 VIEW

[view not recorded (1 of 2)]
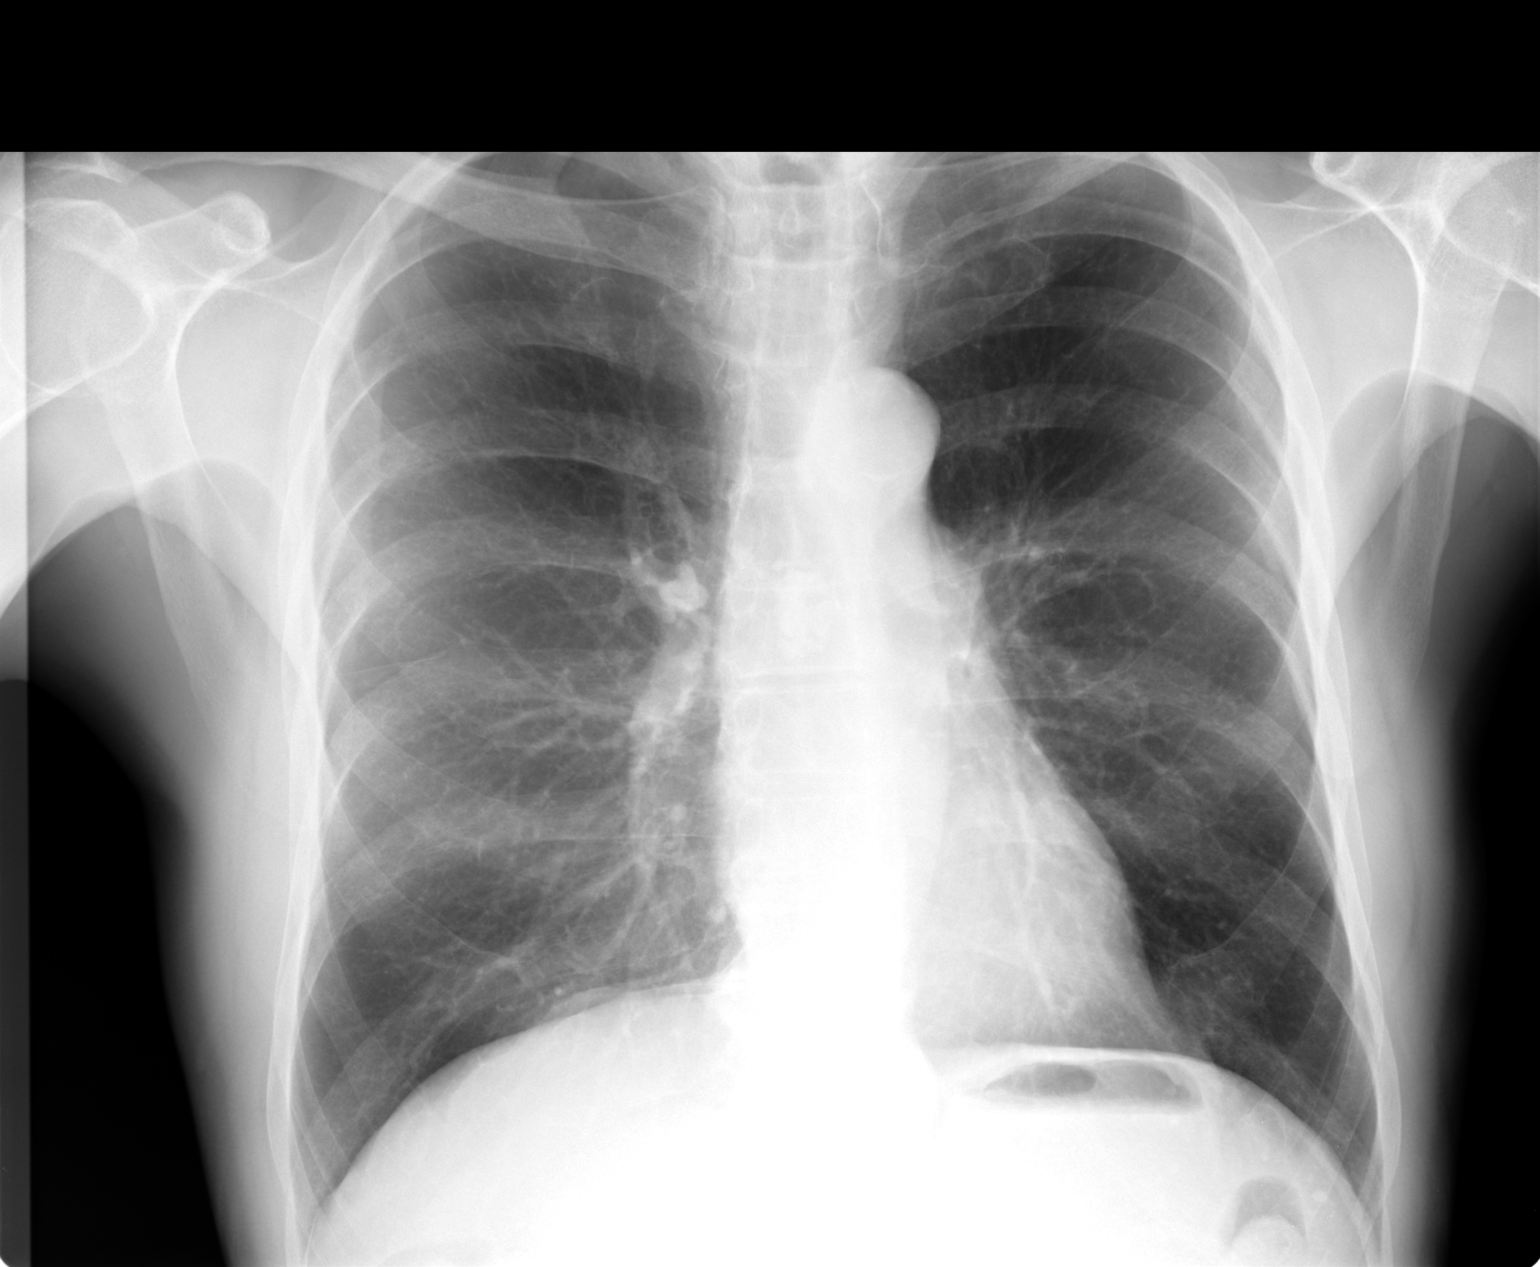

[view not recorded (2 of 2)]
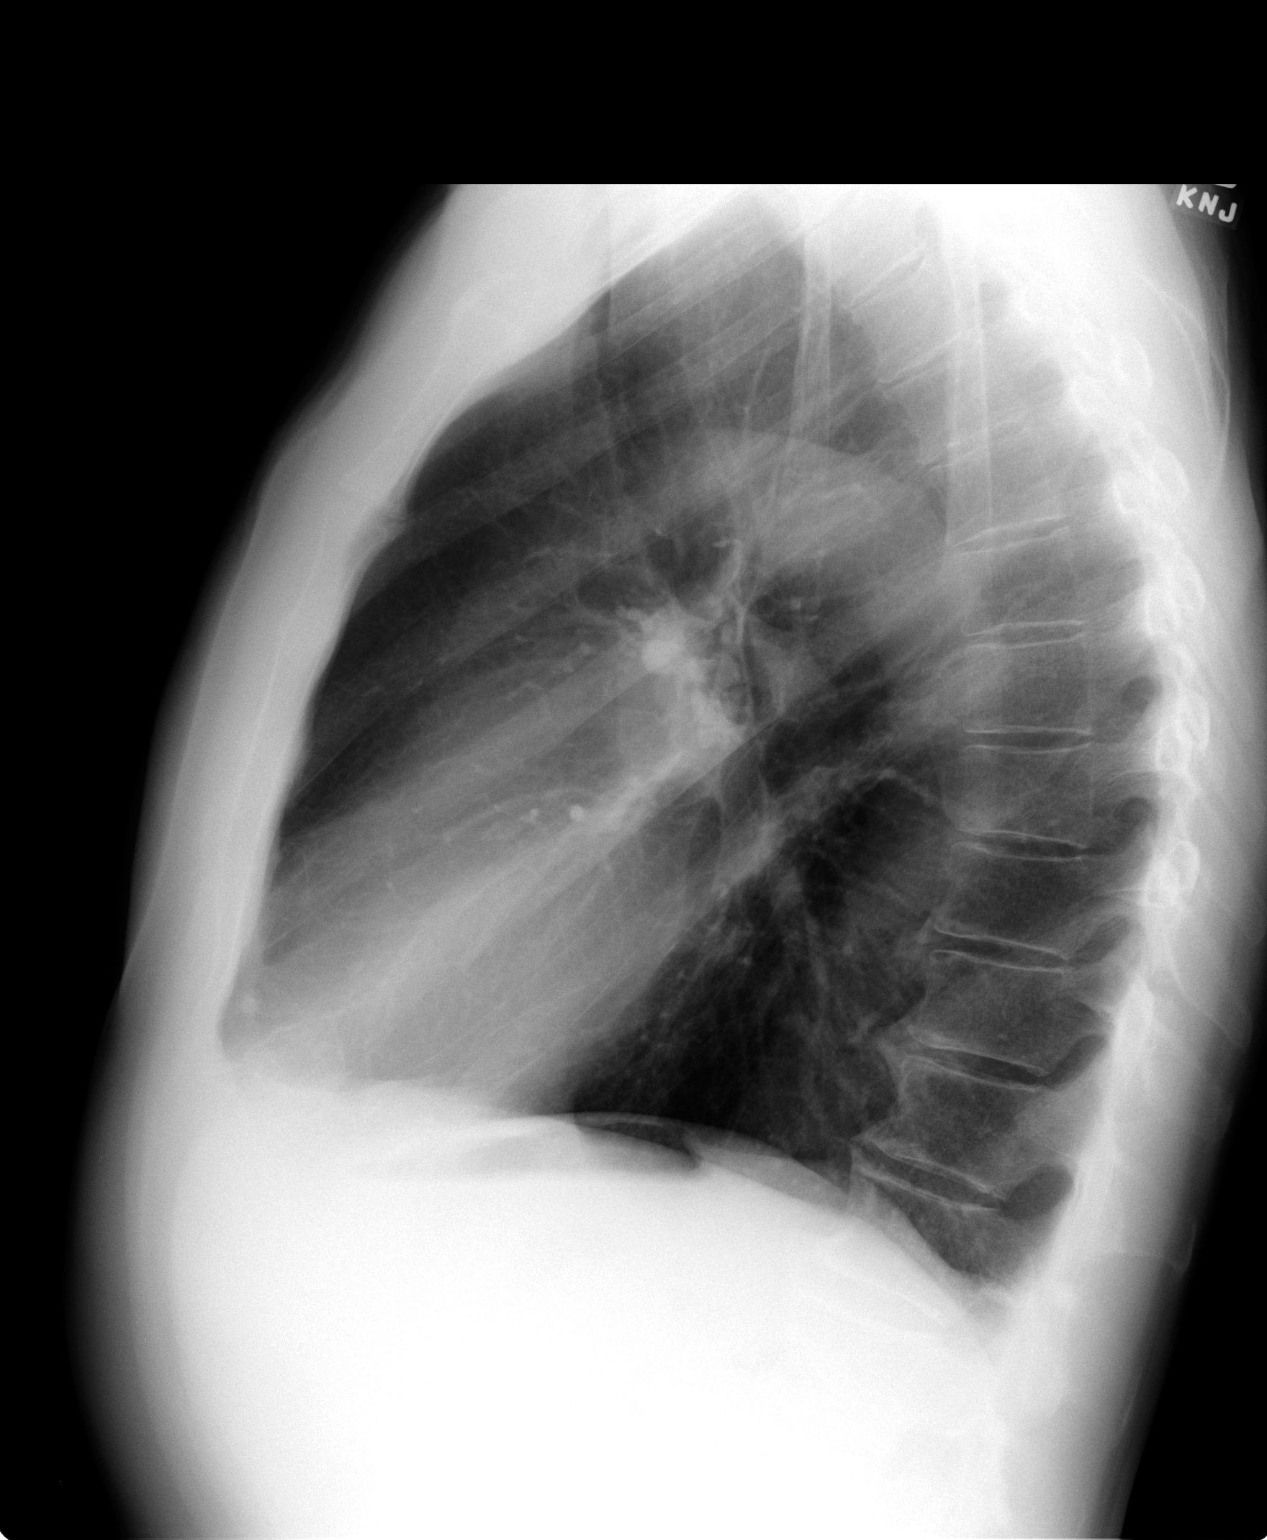

[2 of 2 positions shown; findings below may reference images not displayed]

FINDINGS: Lungs are mildly hyperinflated. Normal mediastinum and cardiac
silhouette. Normal pulmonary vasculature. No evidence of effusion,
infiltrate, or pneumothorax. No acute bony abnormality.
IMPRESSION: Hyperinflated lungs. No acute cardiopulmonary process.

## 2015-08-16 ENCOUNTER — Encounter: Payer: Self-pay | Admitting: Gastroenterology

## 2015-08-16 DIAGNOSIS — M1712 Unilateral primary osteoarthritis, left knee: Secondary | ICD-10-CM | POA: Diagnosis not present

## 2015-08-16 DIAGNOSIS — M25562 Pain in left knee: Secondary | ICD-10-CM | POA: Diagnosis not present

## 2015-08-22 ENCOUNTER — Ambulatory Visit (INDEPENDENT_AMBULATORY_CARE_PROVIDER_SITE_OTHER): Payer: Medicare Other

## 2015-08-22 DIAGNOSIS — M25562 Pain in left knee: Secondary | ICD-10-CM | POA: Diagnosis not present

## 2015-08-22 DIAGNOSIS — M1712 Unilateral primary osteoarthritis, left knee: Secondary | ICD-10-CM | POA: Diagnosis not present

## 2015-08-22 DIAGNOSIS — Z23 Encounter for immunization: Secondary | ICD-10-CM

## 2015-08-23 DIAGNOSIS — Z23 Encounter for immunization: Secondary | ICD-10-CM

## 2015-08-30 DIAGNOSIS — M1712 Unilateral primary osteoarthritis, left knee: Secondary | ICD-10-CM | POA: Diagnosis not present

## 2015-11-16 DIAGNOSIS — L821 Other seborrheic keratosis: Secondary | ICD-10-CM | POA: Diagnosis not present

## 2015-11-16 DIAGNOSIS — L570 Actinic keratosis: Secondary | ICD-10-CM | POA: Diagnosis not present

## 2015-11-16 DIAGNOSIS — D1801 Hemangioma of skin and subcutaneous tissue: Secondary | ICD-10-CM | POA: Diagnosis not present

## 2015-11-16 DIAGNOSIS — L905 Scar conditions and fibrosis of skin: Secondary | ICD-10-CM | POA: Diagnosis not present

## 2015-11-18 DIAGNOSIS — M25562 Pain in left knee: Secondary | ICD-10-CM | POA: Diagnosis not present

## 2015-11-18 DIAGNOSIS — M1712 Unilateral primary osteoarthritis, left knee: Secondary | ICD-10-CM | POA: Diagnosis not present

## 2015-11-30 DIAGNOSIS — M1712 Unilateral primary osteoarthritis, left knee: Secondary | ICD-10-CM | POA: Diagnosis not present

## 2015-12-18 DIAGNOSIS — S83242D Other tear of medial meniscus, current injury, left knee, subsequent encounter: Secondary | ICD-10-CM | POA: Diagnosis not present

## 2015-12-18 DIAGNOSIS — M25562 Pain in left knee: Secondary | ICD-10-CM | POA: Diagnosis not present

## 2015-12-18 DIAGNOSIS — M1712 Unilateral primary osteoarthritis, left knee: Secondary | ICD-10-CM | POA: Diagnosis not present

## 2015-12-18 DIAGNOSIS — G8929 Other chronic pain: Secondary | ICD-10-CM | POA: Diagnosis not present

## 2015-12-22 ENCOUNTER — Encounter: Payer: Self-pay | Admitting: Pulmonary Disease

## 2016-01-03 ENCOUNTER — Encounter: Payer: Self-pay | Admitting: Pulmonary Disease

## 2016-01-03 ENCOUNTER — Other Ambulatory Visit (INDEPENDENT_AMBULATORY_CARE_PROVIDER_SITE_OTHER): Payer: Medicare Other

## 2016-01-03 ENCOUNTER — Ambulatory Visit (INDEPENDENT_AMBULATORY_CARE_PROVIDER_SITE_OTHER): Payer: Medicare Other | Admitting: Pulmonary Disease

## 2016-01-03 ENCOUNTER — Ambulatory Visit (INDEPENDENT_AMBULATORY_CARE_PROVIDER_SITE_OTHER)
Admission: RE | Admit: 2016-01-03 | Discharge: 2016-01-03 | Disposition: A | Discharge status: Home / Self Care | Payer: Medicare Other | Source: Ambulatory Visit | Attending: Pulmonary Disease | Admitting: Pulmonary Disease

## 2016-01-03 DIAGNOSIS — F411 Generalized anxiety disorder: Secondary | ICD-10-CM

## 2016-01-03 DIAGNOSIS — Z01818 Encounter for other preprocedural examination: Secondary | ICD-10-CM | POA: Diagnosis not present

## 2016-01-03 DIAGNOSIS — C61 Malignant neoplasm of prostate: Secondary | ICD-10-CM

## 2016-01-03 DIAGNOSIS — E7800 Pure hypercholesterolemia, unspecified: Secondary | ICD-10-CM

## 2016-01-03 DIAGNOSIS — R490 Dysphonia: Secondary | ICD-10-CM | POA: Diagnosis not present

## 2016-01-03 DIAGNOSIS — K625 Hemorrhage of anus and rectum: Secondary | ICD-10-CM | POA: Diagnosis not present

## 2016-01-03 DIAGNOSIS — J382 Nodules of vocal cords: Secondary | ICD-10-CM

## 2016-01-03 DIAGNOSIS — D71 Functional disorders of polymorphonuclear neutrophils: Secondary | ICD-10-CM

## 2016-01-03 DIAGNOSIS — C439 Malignant melanoma of skin, unspecified: Secondary | ICD-10-CM

## 2016-01-03 DIAGNOSIS — M25562 Pain in left knee: Secondary | ICD-10-CM | POA: Diagnosis not present

## 2016-01-03 LAB — LIPID PANEL
CHOLESTEROL: 187 mg/dL (ref 0–200)
HDL: 68.8 mg/dL (ref >39.00)
LDL CALC: 110 mg/dL — AB (ref 0–99)
NonHDL: 118.6
TRIGLYCERIDES: 42 mg/dL (ref 0.0–149.0)
Total CHOL/HDL Ratio: 3
VLDL: 8.4 mg/dL (ref 0.0–40.0)

## 2016-01-03 LAB — CBC WITH DIFFERENTIAL/PLATELET
BASOS PCT: 0.8 % (ref 0.0–3.0)
Basophils Absolute: 0 10*3/uL (ref 0.0–0.1)
EOS PCT: 0.6 % (ref 0.0–5.0)
Eosinophils Absolute: 0 10*3/uL (ref 0.0–0.7)
HEMATOCRIT: 44.2 % (ref 39.0–52.0)
HEMOGLOBIN: 15 g/dL (ref 13.0–17.0)
Lymphocytes Relative: 16.5 % (ref 12.0–46.0)
Lymphs Abs: 1 10*3/uL (ref 0.7–4.0)
MCHC: 33.9 g/dL (ref 30.0–36.0)
MCV: 93.9 fl (ref 78.0–100.0)
MONO ABS: 0.4 10*3/uL (ref 0.1–1.0)
Monocytes Relative: 7.4 % (ref 3.0–12.0)
NEUTROS ABS: 4.3 10*3/uL (ref 1.4–7.7)
Neutrophils Relative %: 74.7 % (ref 43.0–77.0)
Platelets: 274 10*3/uL (ref 150.0–400.0)
RBC: 4.71 Mil/uL (ref 4.22–5.81)
RDW: 13.7 % (ref 11.5–15.5)
WBC: 5.8 10*3/uL (ref 4.0–10.5)

## 2016-01-03 LAB — BASIC METABOLIC PANEL
BUN: 22 mg/dL (ref 6–23)
CALCIUM: 9.5 mg/dL (ref 8.4–10.5)
CO2: 28 meq/L (ref 19–32)
CREATININE: 0.91 mg/dL (ref 0.40–1.50)
Chloride: 105 mEq/L (ref 96–112)
GFR: 84.26 mL/min (ref >60.00)
GLUCOSE: 108 mg/dL — AB (ref 70–99)
Potassium: 4.6 mEq/L (ref 3.5–5.1)
Sodium: 142 mEq/L (ref 135–145)

## 2016-01-03 LAB — HEPATIC FUNCTION PANEL
ALT: 14 U/L (ref 0–53)
AST: 16 U/L (ref 0–37)
Albumin: 4.4 g/dL (ref 3.5–5.2)
Alkaline Phosphatase: 50 U/L (ref 39–117)
BILIRUBIN TOTAL: 0.6 mg/dL (ref 0.2–1.2)
Bilirubin, Direct: 0.1 mg/dL (ref 0.0–0.3)
Total Protein: 7 g/dL (ref 6.0–8.3)

## 2016-01-03 LAB — TSH: TSH: 1.68 u[IU]/mL (ref 0.35–4.50)

## 2016-01-03 LAB — PROTIME-INR
INR: 1 ratio (ref 0.8–1.0)
Prothrombin Time: 10.3 s (ref 9.6–13.1)

## 2016-01-03 LAB — APTT: aPTT: 23 s — ABNORMAL LOW (ref 23.4–32.7)

## 2016-01-03 LAB — PSA: PSA: 0 ng/mL — AB (ref 0.10–4.00)

## 2016-01-03 NOTE — Progress Notes (Signed)
Subjective:     Patient ID: Darren Edwards, male   DOB: 06/26/1929, 82 y.o.   MRN: [id number]  HPI  82 y/o WM here for a follow up visit...   ~  Mar10:  he has no complaints or concerns today and states "I'm doing super"... he is very athletic and runs 4-5 mi 3x/week, does 5K races, and does an exercise bike 3x/week... he is followed at the [facility] yearly and notes that he gets his Zocor for free there...  ~  June 20, 2010:  109moROV- "just super" he says... again w/o complaints or concerns, but he is concerned about what he has read about "statins and ALS"> it is his desire to stop his Simvastatin (currently taking 1/2 of the 418mpill) and treat his hypercholesterolemia w/ diet & exercise alone... I reminded him of his pos family hx for heart disease (risk factor) but he is undeterred & wants to stop the Zocor Rx... he had fasting labs 3/11 & these are reviewed+ all essent WNL & his PSA remains 0.00 s/p prostate surg 2006 as below...  [facility] states his wife made him go due to bl in stool & colon was neg x for 103m112molyp (bx= polypopid mucosa) & int hems...   He saw DrFields 3/11 w/ plantar fasciitis- he felt it was longitudinal arch strain, given arch support & exercises- improved...  ~  September 01, 2013:  3yeLaurel Parkeps regular f/u w/ DrFields/ ortho due to his running; he reports that he fell off a ladder ~30yr19yr w/ bruised left knee, meniscus tear, injured ankle, left hip discomfort- all tended by Drfields w/ shot that helped; he tells me that he's researched Synvisc and [facility] (we don't have these records- but pt indicates that he does Robotic surg), he is holding off on the Synvisc for now & doing exercises...     Hoarseness, VC nodule> followed by DrShoemaker/ENT on antireflux regimen & Omep40Qd; he nreports improved & they are holding off on VC nodule surg since the reflux regimen is helping...    Old granulomatous dis, Hx  AB> on no regular meds and "doing super" he says; denies cough, sput, hemoptysis, SOB, etc...    Hx HBP & FamHx CAD> on meds transiently in the past, he prefers diet & exercise; BP= 140/74 & he denies CP, palpit, SOB, dizzy, edema, etc; he runs regularly "I'm a healthy robust specimen"...    CHOL> states [facility]- seen once yearly, prev on Simva20 & they decr to 10mg60m?if taking); FLP 9/14 showed TChol 199, TG 43, HDL 54, LDL 137 & he is rec to incr back to 20mg/42m    GI- Divertics & colon polyp> last colon 4/11 by DrKaplan w/ 103mm se12mle polyp & bx= benign mucosa...    GU- prostate cancer> Dx 2006 by drTannenbaum, [facility] & PSA has remained zero since then; Labs 9/14 showed PSA= 0.00    DJD & DDD> he is followed by DrFields for Ortho/ sports med & checked very often; he stopped running 5/14 due to knees & now uses Airdyne bike...     Hx Melanoma> stage 1 MM removed from his back 6/05 by DrNolanMarlynn Perkingntinues to f/u w/ derm every 12mo... 61moeviewed prob list, meds, xrays and labs> see below for updates >> he had fasting labs done 9/14 here... He had  the 2014 Flu vaccine last month...  CXR 11/14 showed norm heart size, clear lungs, ?sl hyperinflated, NAD.Marland KitchenMarland Kitchen  EKG 11/14 showed NSR, rate72, WNL, NAD...   LABS 9/14:  FLP- ok x LDL=137 & he stopped prev Simva rx- prefers diet alone;  Chems- wnl;  CBC- wnl;  TSH=2.10; PSA=0.00  Note>> pt had labs done at the [facility] 4/14 & provided copies to be scanned into Epic...  ~  January 03, 2016:  28moROV & WFranchot Mimesis sched for a "partial" left knee replacement by DrOlin in April- states he had a meniscus tear due to his running, then cartilage loss, s/p mult shots w/ cortisone & Synvisc, now planning knee surg (he says that this surg should last him til he is 89 "then DrOlin says he will do a total knee replacement for free"; no longer running but walks and uses his Airdyne bike... We reviewed the following  medical problems during today's office visit >>     Hoarseness, VC nodule> followed by DrShoemaker/ENT (VCnodule, GERD, chr laryngitis)- on antireflux regimen & Omep40Qd; he reports improved & they are holding off on VC nodule surg since the reflux regimen is helping...    Old granulomatous dis, Hx AB> on no regular meds and "doing super" he says; denies cough, sput, hemoptysis, SOB, etc...    Hx HBP & FamHx CAD> on meds transiently in the past, he prefers diet & exercise; BP= 144/76 & he denies CP, palpit, SOB, dizzy, edema, etc; he exercizes regularly "I'm a healthy robust specimen"...    CHOL> [facility], on Simva20 (?1/2 of a 436mtab?); FLP 3/17 here showed TChol 187, TG 42, HDL 69, LDL 110; we reviewed diet...    GI- Divertics, colon polyp, & hem> last colon 4/11 by DrKaplan w/ 99m799messile polyp & bx= benign mucosa; he had BRB from hem [facility]...    GU- prostate cancer> Dx 2006 by DrTannenbaum, [facility] & PSA has remained zero since then; Labs here 3/17 showed PSA= 0.00    DJD & DDD> he is followed by DrFields for Ortho/ sports med; he stopped running 5/14 due to knees & now uses Airdyne bike; eval by DrOlin=> plans partial left knee replacement (Left UKA medially)    Hx Melanoma> stage 1 MM removed from his back 6/05 by DrNMarlynn Perkinge continues to f/u w/ derm every 72mo672moEXAM shows Afeb, VSS, O2sat=100% on RA;  HEENT- sl hoarse/dysphonia;  Chest- clear w/o w/r/r;  Heart- RR w/o m/r/g;  Abd- soft, nontender, neg;  Ext- DJD Lknee, w/o c/c/e;  Neuro- intact...  CXR 01/03/16>  Norm heart size, sl hyperinflation, calcif nodes c/w old granulomatous dis, degen changes in Tspine, NAD...  Marland KitchenMarland KitchenKG 01/03/16>  NSR, rate72, WNL, NAD...  LABS 01/03/16>  FLP- at goals x LDL=110 on Simva20;  Chems- wnl x BS=108 c/w IFG- diet rx;  CBC- wnl;  PT/PTT- wnl;  TSH=1.68;  PSA= 0.00...  IMP/PLAN>>  WaltFranchot Mimesexcited to get the  knee surg completed;  Medically he is cleared for surg, ave risk;  He is asked to continue same meds, we reviewed low Chol/ low carb diet, he does an excellent job w/ exercise, antireflux regimen & continued follow up by ENT & Derm... We plan recheck in 59yr,22yrner if needed for any reason...            Problem List:   HOARSENESS -  he has a hoarse, sl weak voice and he notes that he is not able to read aloud for very long... we discussed the need for ENT eval to check his cords & r/o poss lesion... he notes occas reflux symptoms intermittently for 31yr he says> advised to take OTC PRILOSEC 24md 3079mprior to 1st meal of the day. ~  8/14:  Latest ENT f/u by DrShowmaker> VC nodule, LPR, chr hoarseness & raspy voice- reflux, cough, throat clearing; on Omep40 after dinner Qd; Laryngoscopy w/ some VC atrophy, right VC nodule, norm mobility, some erythema; DrShoemaker wanted to remove the nodule but pt says subseq visits showed antireflux regimen & Omep were helping so they have put this off & he continues to follow...   Hx of ASTHMATIC BRONCHITIS, ACUTE (ICD-466.0)  -  hx AB treated w/ Ab's and Advair transiently in 2003... no recurrent symptoms.  CHRONIC GRANULOMATOUS DISEASE (ICD-288.1)  -  old CXR & CTChest 1/07 showed calcif gran in RML and calcif in right hilum and in spleen, no clinical hx of Tb or known exposure... ~  f/u CXR 8/11 showed no change in the calcif mediastinal & hilar LNs, stable. ~  11/14: on no regular meds and "doing super" he says; denies cough, sput, hemoptysis, SOB, etc... ~  CXR 11/14 showed norm heart size, clear lungs, ?sl hyperinflated, NAD  Hx of HYPERTENSION (ICD-401.9)  -  transient elevated BP in the past prev rx w/ Lisinopril 23m52m.. pt prefers diet & exerc and BP's have been fine in recent years... BP= 124/76 today and averages 120's/ 60's at home... denies HA, fatigue, visual changes, CP, palipit, dizziness, syncope, dyspnea, edema, etc...  ~  11/14: on meds  transiently in the past, he prefers diet & exercise; BP= 140/74 & he denies CP, palpit, SOB, dizzy, edema, etc; he runs regularly "I'm a healthy robust specimen"   Family Hx of CORONARY ARTERY DISEASE (ICD-414.00)  -  he takes ASA 81mg28m. Father had hx CAD, & Brother w/ MI... pt had cath years ago which was reportedly normal... NuclearStressTest 12/03 and again 3/07 were normal- no ischemia, EF=62%...  HYPERCHOLESTEROLEMIA (ICD-272.0)  -  on SIMVASTATIN 40mgt42m 1/2 tab daily + CoQ 10 OTC... ~  FLP 2[place] T[place]ol 160, TG 42, HDL 54, LDL 98 ~  FLP 2/10 showed TChol 163, TG 42, HDL 51, LDL 104... he prefers to continue same Rx. ~  FLP 3[place] TChol 148, TG 41, HDL 53, LDL 87 ~  8/11: he is concerned about what he has read regarding statins and ALS- he wants to stop his Simva & do the best he can w/o medication on diet alone... ~  11/14: [facility], prev on Simva20 & they decr to 23mg/d69mf taking); FLP 9/14 showed TChol 199, TG 43, HDL 54, LDL 137 & he is rec to incr back to 20mg/d 8mVERTICULOSIS OF COLON (ICD-562.10)  -  he had colonoscopy was 2/01 by DrKaplan & neg x divertics... ~  4/11: he states that his wife made him ret to GI due to blood in stool & repeat colonoscopy 4/11 showed 4mm poly58mbx= polypoid mucosa) & int hems...  ADENOCARCINOMA, PROSTATE (ICD-185)  -  Dx 2006 by DrTannenbaum... [facility]... ~  labs 2/10 showed PSA= 0.01 ~  labs 3/11 showed PSA= 0.00 ~  Labs 9/14 showed PSA= 0.00  RENAL CYST (ICD-593.2)  -  CTscan has  revealed a 3-4cm right renal cyst (upper pole)...  OSTEOARTHRITIS >>  DEGENERATIVE DISC DISEASE (ICD-722.6)  -  periodic evaluations from "sports medicine" DrDaldorf & DrFields for PT and exercises to keep him running... 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 ~  11/14:  Epic review shows approx 10 visits w/ DrFields over the past yr-  notes reviewed; pt also [facility], DrMartin in Ortho for poss robotic surg on knee but he is holding off; pt has also researched Synvisc & considering this...   Hx of MALIGNANT MELANOMA (ICD-172.9)  -  stage I MM removed from his back in 6/05 by DrNolan... he continues to f/u w/ Derm every 6 months (now [facility]). 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  Health Maintenance:  he takes various nutrients & supplements "to prevent diabetes" ~  GI:  DrKaplan w/ colonoscopy 4/11 as above. ~  GU:  [facility]Tannenbaum and [facility]>  we do his yearly PSA & it has remained "zero" since surg. ~  Immunizations:  he gets the yearly seasonal Flu vaccine each fall;  he had the PNEUMOVAX in 2010 (age 28);  we discussed the 10 yr Tetanus vaccine;  he inquired about the Shingles vaccine & is [facility] for this.   Past Medical History  Diagnosis Date  . Hypercholesteremia   HOARSENESS (ICD-784.42) ALLERGIC RHINITIS (ICD-477.9) Hx of ASTHMATIC BRONCHITIS, ACUTE (ICD-466.0) CHRONIC GRANULOMATOUS DISEASE (ICD-288.1) Hx of HYPERTENSION (ICD-401.9) Family Hx of CORONARY ARTERY DISEASE (ICD-414.00) HYPERCHOLESTEROLEMIA (ICD-272.0) DIVERTICULOSIS OF COLON (ICD-562.10) ADENOCARCINOMA, PROSTATE (ICD-185) RENAL CYST (ICD-593.2) DEGENERATIVE DISC DISEASE (ICD-722.6) Hx of MALIGNANT MELANOMA (ICD-172.9)   Past Surgical History  Procedure Laterality Date  . Tonsillectomy    . Prostatectomy    . Colonoscopy  2011    w/Dr. Deatra Ina  S/P T & A S/P vasectomy S/P radical prostatectomy melenoma excised from back   Outpatient Encounter Prescriptions as of 01/03/2016  Medication Sig  . aspirin 81 MG tablet Take 81 mg by mouth daily.  Marland Kitchen omeprazole (PRILOSEC) 40 MG capsule Take 40 mg by mouth daily.  . simvastatin (ZOCOR) 20 MG tablet Take 10 mg by mouth every evening.   . Undecylenic Ac-Zn Undecylenate ([employer] ANTIFUNGAL  MAXIMUM STR) 5-20 % OINT Apply 1 application topically daily.  . [DISCONTINUED] hydrocortisone (ANUSOL-HC) 2.5 % rectal cream Place 1 application rectally 2 (two) times daily. (Patient not taking: Reported on 01/03/2016)   No facility-administered encounter medications on file as of 01/03/2016.    Allergies  Allergen Reactions  . Penicillins     REACTION: rash    Family History: Reviewed history from 8/11 and no changes required. heart disease - father had MI, brother had CABG cancer - sister (lung), father (lung) Family History of Ovarian Cancer: Mother No FH of Colon Cancer:   Social History: Reviewed history from 8/11 and no changes required. never smoked drinks wine 4-5 times a week married 5 children avid runner retired Engineer, water Smoking Status:  never   Current Medications, Allergies, Past Medical History, Past Surgical History, Family History, and Social History were reviewed in [employer] record.   Review of Systems     The patient denies fever, chills, sweats, anorexia, fatigue, weakness, malaise, weight loss, sleep disorder, blurring, diplopia, eye irritation, eye discharge, vision loss, eye pain, photophobia, earache, ear discharge, tinnitus, decreased hearing, nasal  congestion, nosebleeds, sore throat, hoarseness, chest pain, palpitations, syncope, dyspnea on exertion, orthopnea, PND, peripheral edema, cough, dyspnea at rest, excessive sputum, hemoptysis, wheezing, pleurisy, nausea, vomiting, diarrhea, constipation, change in bowel habits, abdominal pain, melena, hematochezia, jaundice, gas/bloating, indigestion/heartburn, dysphagia, odynophagia, dysuria, hematuria, urinary frequency, urinary hesitancy, nocturia, incontinence, back pain, joint pain, joint swelling, muscle cramps, muscle weakness, stiffness, arthritis, sciatica, restless legs, leg pain at night, leg pain with exertion, rash, itching, dryness, suspicious lesions, paralysis,  paresthesias, seizures, tremors, vertigo, transient blindness, frequent falls, frequent headaches, difficulty walking, depression, anxiety, memory loss, confusion, cold intolerance, heat intolerance, polydipsia, polyphagia, polyuria, unusual weight change, abnormal bruising, bleeding, enlarged lymph nodes, urticaria, allergic rash, hay fever, and recurrent infections.     Objective:   Physical Exam      WD, WN, 82 y/o WM in NAD... GENERAL:  Alert & oriented; pleasant & cooperative... HEENT:  [place]/AT, EOM-wnl, PERRLA, Fundi-benign, EACs-clear, TMs-wnl, NOSE-clear, THROAT-clear & wnl, but voice is chronically hoarse & sl weak. NECK:  Supple w/ fairROM; no JVD; normal carotid impulses w/o bruits; no thyromegaly or nodules palpated; no lymphadenopathy. CHEST:  Clear to P & A; without wheezes/ rales/ or rhonchi. HEART:  Regular Rhythm; without murmurs/ rubs/ or gallops. ABDOMEN:  Soft & nontender; normal bowel sounds; no organomegaly or masses detected. EXT: without deformities, mild arthritic changes; no varicose veins/ venous insuffic/ or edema. NEURO:  CN's intact; motor testing normal; sensory testing normal; gait normal & balance OK. DERM:  No lesions noted; no rash etc...  RADIOLOGY DATA:  Reviewed in the EPIC EMR & discussed w/ the patient...  LABORATORY DATA:  Reviewed in the EPIC EMR & discussed w/ the patient...   Assessment:      Hoarseness, VC nodule> followed by DrShoemaker/ENT on antireflux regimen & Omep40Qd; he reports improved & they are holding off on VC nodule surg since the reflux regimen is helping...  Old granulomatous dis, Hx AB> on no regular meds and "doing super" he says...  Hx HBP & FamHx CAD> on meds transiently in the past, he prefers diet & exercise...  CHOL> states he[facility] this- seen once yearly, on Simva20 & f/u FLP looks ok x LDL=110, we reviewed diet, he does not want to incr dose...  GI- Divertics & colon polyp> last colon 4/11 by  DrKaplan w/ 30m sessile polyp & bx= benign mucosa...  GU- prostate cancer> Dx 2006 [facility] Dr[facility]annenbaum[facility] & PSA has remained zero since then...  DJD & DDD> he is followed by DrFields for Ortho/ sports med & DrOlin- [place] for left knee surg...  Hx Melanoma> stage 1 MM removed from his back 6/05 by DMarlynn Perking he continues to f/u w/ derm every 653mo.     Plan:     Patient's Medications  New Prescriptions   No medications on file  Previous Medications   ASPIRIN 81 MG TABLET    Take 81 mg by mouth daily.   OMEPRAZOLE (PRILOSEC) 40 MG CAPSULE    Take 40 mg by mouth daily.   SIMVASTATIN (ZOCOR) 20 MG TABLET    Take 10 mg by mouth every evening.    UNDECYLENIC AC-ZN UNDECYLENATE ([employer]S ANTIFUNGAL MAXIMUM STR) 5-20 % OINT    Apply 1 application topically daily.  Modified Medications   No medications on file  Discontinued Medications   HYDROCORTISONE (ANUSOL-HC) 2.5 % RECTAL CREAM    Place 1 application rectally 2 (two) times daily.

## 2016-01-03 NOTE — Patient Instructions (Signed)
Today we updated your med list in our EPIC system...    Continue your current medications the same...  Today we checked a follow up CXR, EKG, and FASTING blood work...    We will contact you w/ the results when available...   We will send a note toDrOlin clearing you medically for the up-coming knee surg...    Best wishes!  Call for any questions or if we can be of service in any way.Marland KitchenMarland Kitchen

## 2016-01-04 NOTE — Addendum Note (Signed)
Addended by: Len Blalock on: 01/04/2016 09:59 AM   Modules accepted: Orders

## 2016-01-10 DIAGNOSIS — D485 Neoplasm of uncertain behavior of skin: Secondary | ICD-10-CM | POA: Diagnosis not present

## 2016-01-10 DIAGNOSIS — L570 Actinic keratosis: Secondary | ICD-10-CM | POA: Diagnosis not present

## 2016-01-10 DIAGNOSIS — L905 Scar conditions and fibrosis of skin: Secondary | ICD-10-CM | POA: Diagnosis not present

## 2016-02-01 ENCOUNTER — Encounter: Payer: Self-pay | Admitting: Pulmonary Disease

## 2016-02-01 NOTE — H&P (Signed)
UNICOMPARTMENTAL KNEE ADMISSION H&P  Patient is being admitted for left medial unicompartmental knee arthroplasty.  Subjective:  Chief Complaint:  Left knee medial compartmental primary OA /pain    HPI: KEVAN SIBBALD, 82 y.o. male male, has a history of pain and functional disability in the left and has failed non-surgical conservative treatments for greater than 12 weeks to include NSAID's and/or analgesics, corticosteriod injections, viscosupplementation injections and activity modification.  Onset of symptoms was gradual, starting 3 years ago with gradually worsening course since that time. The patient noted no past surgery on the left knee(s).  Patient currently rates pain in the left knee(s) at 8 out of 10 with activity. Patient has worsening of pain with activity and weight bearing, pain that interferes with activities of daily living, pain with passive range of motion, crepitus and joint swelling.  Patient has evidence of periarticular osteophytes and joint space narrowing of the medial compartment by imaging studies.  There is no active infection.  Risks, benefits and expectations were discussed with the patient.  Risks including but not limited to the risk of anesthesia, blood clots, nerve damage, blood vessel damage, failure of the prosthesis, infection and up to and including death.  Patient understand the risks, benefits and expectations and wishes to proceed with surgery.   PCP: Noralee Space, MD  D/C Plans:      Home  Post-op Meds:       No Rx given  Tranexamic Acid:      To be given - IV   Decadron:    It is to be given  FYI:     ASA  Norco     Past Medical History  Diagnosis Date  . Hypercholesteremia      Past Surgical History  Procedure Laterality Date  . Tonsillectomy    . Prostatectomy    . Colonoscopy  2011    w/Dr. Deatra Ina    Allergies  Allergen Reactions  . Penicillins Rash    Has patient had a PCN reaction causing immediate rash,  facial/tongue/throat swelling, SOB or lightheadedness with hypotension: No Has patient had a PCN reaction causing severe rash involving mucus membranes or skin necrosis: No Has patient had a PCN reaction that required hospitalization No Has patient had a PCN reaction occurring within the last 10 years: No If all of the above answers are "NO", then may proceed with Cephalosporin use.    Social History  Substance Use Topics  . Smoking status: Never Smoker   . Smokeless tobacco: Never Used  . Alcohol Use: Yes     Comment: social     Family History  Problem Relation Age of Onset  . Colon cancer Neg Hx   . Colon polyps Neg Hx   . Diabetes Neg Hx   . Esophageal cancer Neg Hx      Review of Systems  Constitutional: Negative.   HENT: Negative.   Eyes: Negative.   Respiratory: Negative.   Cardiovascular: Negative.   Gastrointestinal: Positive for heartburn.  Genitourinary: Positive for frequency.  Musculoskeletal: Positive for joint pain.  Skin: Negative.   Neurological: Negative.   Endo/Heme/Allergies: Negative.   Psychiatric/Behavioral: Negative.      Objective:   Physical Exam  Constitutional: He is oriented to person, place, and time and well-developed, well-nourished, and in no distress.  HENT:  Head: Normocephalic.  Eyes: Pupils are equal, round, and reactive to light.  Neck: Neck supple. No JVD present. No tracheal deviation present. No thyromegaly present.  Cardiovascular: Normal  rate, regular rhythm, normal heart sounds and intact distal pulses.   Pulmonary/Chest: Effort normal and breath sounds normal. No stridor. No respiratory distress. He has no wheezes.  Abdominal: Soft. There is no tenderness. There is no guarding.  Musculoskeletal:       Left knee: He exhibits decreased range of motion, swelling and bony tenderness. He exhibits no ecchymosis, no deformity, no laceration and no erythema. Tenderness found. Medial joint line tenderness noted. No lateral joint  line tenderness noted.  Lymphadenopathy:    He has no cervical adenopathy.  Neurological: He is alert and oriented to person, place, and time.  Skin: Skin is warm and dry.  Psychiatric: Affect normal.       Labs:  Estimated body mass index is 19.96 kg/(m^2) as calculated from the following:   Height as of 08/11/14: 5' 8.5" (1.74 m).   Weight as of 08/11/14: 60.442 kg (133 lb 4 oz).   Imaging Review Plain radiographs demonstrate severe degenerative joint disease of the left knee(s) medial compartment. The overall alignment is neutral. The bone quality appears to be good for age and reported activity level.  Assessment/Plan:  End stage arthritis, left knee medial compartment  The patient history, physical examination, clinical judgment of the provider and imaging studies are consistent with end stage degenerative joint disease of the left knee(s) and medial unicompartmental knee arthroplasty is deemed medically necessary. The treatment options including medical management, injection therapy arthroscopy and arthroplasty were discussed at length. The risks and benefits of total knee arthroplasty were presented and reviewed. The risks due to aseptic loosening, infection, stiffness, patella tracking problems, thromboembolic complications and other imponderables were discussed. The patient acknowledged the explanation, agreed to proceed with the plan and consent was signed. Patient is being admitted for outpatient / observation treatment for surgery, pain control, PT, OT, prophylactic antibiotics, VTE prophylaxis, progressive ambulation and ADL's and discharge planning. The patient is planning to be discharged home with home health services.    [place]   PA-C  02/01/2016, 9:31 AM

## 2016-02-02 ENCOUNTER — Encounter (HOSPITAL_COMMUNITY): Payer: Self-pay

## 2016-02-02 ENCOUNTER — Encounter (HOSPITAL_COMMUNITY)
Admission: RE | Admit: 2016-02-02 | Discharge: 2016-02-02 | Disposition: A | Discharge status: Home / Self Care | Payer: Medicare Other | Source: Ambulatory Visit | Attending: Orthopedic Surgery | Admitting: Orthopedic Surgery

## 2016-02-02 DIAGNOSIS — M1712 Unilateral primary osteoarthritis, left knee: Secondary | ICD-10-CM | POA: Diagnosis not present

## 2016-02-02 DIAGNOSIS — D71 Functional disorders of polymorphonuclear neutrophils: Secondary | ICD-10-CM | POA: Diagnosis not present

## 2016-02-02 DIAGNOSIS — Z01812 Encounter for preprocedural laboratory examination: Secondary | ICD-10-CM | POA: Diagnosis not present

## 2016-02-02 DIAGNOSIS — Z8546 Personal history of malignant neoplasm of prostate: Secondary | ICD-10-CM | POA: Diagnosis not present

## 2016-02-02 DIAGNOSIS — E7800 Pure hypercholesterolemia, unspecified: Secondary | ICD-10-CM | POA: Insufficient documentation

## 2016-02-02 DIAGNOSIS — K5730 Diverticulosis of large intestine without perforation or abscess without bleeding: Secondary | ICD-10-CM | POA: Diagnosis not present

## 2016-02-02 DIAGNOSIS — K625 Hemorrhage of anus and rectum: Secondary | ICD-10-CM | POA: Diagnosis not present

## 2016-02-02 DIAGNOSIS — K219 Gastro-esophageal reflux disease without esophagitis: Secondary | ICD-10-CM | POA: Insufficient documentation

## 2016-02-02 DIAGNOSIS — Z85828 Personal history of other malignant neoplasm of skin: Secondary | ICD-10-CM | POA: Diagnosis not present

## 2016-02-02 DIAGNOSIS — J209 Acute bronchitis, unspecified: Secondary | ICD-10-CM | POA: Diagnosis not present

## 2016-02-02 DIAGNOSIS — I10 Essential (primary) hypertension: Secondary | ICD-10-CM | POA: Insufficient documentation

## 2016-02-02 HISTORY — DX: Malignant (primary) neoplasm, unspecified: C80.1

## 2016-02-02 HISTORY — DX: Unspecified osteoarthritis, unspecified site: M19.90

## 2016-02-02 HISTORY — DX: Gastro-esophageal reflux disease without esophagitis: K21.9

## 2016-02-02 LAB — URINALYSIS, ROUTINE W REFLEX MICROSCOPIC
BILIRUBIN URINE: NEGATIVE
Glucose, UA: NEGATIVE mg/dL
HGB URINE DIPSTICK: NEGATIVE
Ketones, ur: NEGATIVE mg/dL
Leukocytes, UA: NEGATIVE
Nitrite: NEGATIVE
PROTEIN: NEGATIVE mg/dL
Specific Gravity, Urine: 1.022 (ref 1.005–1.030)
pH: 7 (ref 5.0–8.0)

## 2016-02-02 LAB — SURGICAL PCR SCREEN
MRSA, PCR: NEGATIVE
Staphylococcus aureus: NEGATIVE

## 2016-02-02 LAB — APTT: aPTT: 25 seconds (ref 24–37)

## 2016-02-02 LAB — ABO/RH: ABO/RH(D): A POS

## 2016-02-02 LAB — PROTIME-INR
INR: 1.04 (ref 0.00–1.49)
Prothrombin Time: 13.4 seconds (ref 11.6–15.2)

## 2016-02-02 NOTE — Patient Instructions (Addendum)
YOUR PROCEDURE IS SCHEDULED ON :  02/12/16  REPORT TO [facility] MAIN ENTRANCE FOLLOW SIGNS TO EAST ELEVATOR - GO TO 3rd FLOOR CHECK IN AT 3 EAST NURSES STATION (SHORT STAY) AT:  10:00 AM  CALL THIS NUMBER IF YOU HAVE PROBLEMS THE MORNING OF SURGERY [phone]  REMEMBER:ONLY 1 PER PERSON MAY GO TO SHORT STAY WITH YOU TO GET READY THE MORNING OF YOUR SURGERY  DO NOT EAT FOOD OR DRINK LIQUIDS AFTER MIDNIGHT  TAKE THESE MEDICINES THE MORNING OF SURGERY: NONE  STOP ASPIRIN / IBUPROFEN / ALEVE / VITAMINS / HERBAL MEDS _5-7___ DAYS BEFORE SURGERY  YOU MAY NOT HAVE ANY METAL ON YOUR BODY INCLUDING HAIR PINS AND PIERCING'S. DO NOT WEAR JEWELRY, MAKEUP, LOTIONS, POWDERS OR PERFUMES. DO NOT WEAR NAIL POLISH. DO NOT SHAVE 48 HRS PRIOR TO SURGERY. MEN MAY SHAVE FACE AND NECK.  DO NOT [place]. [facility] IS NOT RESPONSIBLE FOR VALUABLES.  CONTACTS, DENTURES OR PARTIALS MAY NOT BE WORN TO SURGERY. LEAVE SUITCASE IN CAR. CAN BE BROUGHT TO ROOM AFTER SURGERY.  PATIENTS DISCHARGED THE DAY OF SURGERY WILL NOT BE ALLOWED TO DRIVE HOME.  PLEASE READ OVER THE FOLLOWING INSTRUCTION SHEETS _________________________________________________________________________________                                          [place] - PREPARING FOR SURGERY  Before surgery, you can play an important role.  Because skin is not sterile, your skin needs to be as free of germs as possible.  You can reduce the number of germs on your skin by washing with CHG (chlorahexidine gluconate) soap before surgery.  CHG is an antiseptic cleaner which kills germs and bonds with the skin to continue killing germs even after washing. Please DO NOT use if you have an allergy to CHG or antibacterial soaps.  If your skin becomes reddened/irritated stop using the CHG and inform your nurse when you arrive at Short Stay. Do not shave (including legs and underarms) for at least 48 hours prior to the  first CHG shower.  You may shave your face. Please follow these instructions carefully:   1.  Shower with CHG Soap the night before surgery and the  morning of Surgery.   2.  If you choose to wash your hair, wash your hair first as usual with your  normal  Shampoo.   3.  After you shampoo, rinse your hair and body thoroughly to remove the  shampoo.                                         4.  Use CHG as you would any other liquid soap.  You can apply chg directly  to the skin and wash . Gently wash with scrungie or clean wascloth    5.  Apply the CHG Soap to your body ONLY FROM THE NECK DOWN.   Do not use on open                           Wound or open sores. Avoid contact with eyes, ears mouth and genitals (private parts).  Genitals (private parts) with your normal soap.              6.  Wash thoroughly, paying special attention to the area where your surgery  will be performed.   7.  Thoroughly rinse your body with warm water from the neck down.   8.  DO NOT shower/wash with your normal soap after using and rinsing off  the CHG Soap .                9.  Pat yourself dry with a clean towel.             10.  Wear clean night clothes to bed after shower             11.  Place clean sheets on your bed the night of your first shower and do not  sleep with pets.  Day of Surgery : Do not apply any lotions/deodorants the morning of surgery.  Please wear clean clothes to the hospital/surgery center.  FAILURE TO FOLLOW THESE INSTRUCTIONS MAY RESULT IN THE CANCELLATION OF YOUR SURGERY    PATIENT SIGNATURE_________________________________  ______________________________________________________________________     Darren Edwards  An incentive spirometer is a tool that can help keep your lungs clear and active. This tool measures how well you are filling your lungs with each breath. Taking long deep breaths may help reverse or decrease the chance of  developing breathing (pulmonary) problems (especially infection) following:  A long period of time when you are unable to move or be active. BEFORE THE PROCEDURE   If the spirometer includes an indicator to show your best effort, your nurse or respiratory therapist will set it to a desired goal.  If possible, sit up straight or lean slightly forward. Try not to slouch.  Hold the incentive spirometer in an upright position. INSTRUCTIONS FOR USE   Sit on the edge of your bed if possible, or sit up as far as you can in bed or on a chair.  Hold the incentive spirometer in an upright position.  Breathe out normally.  Place the mouthpiece in your mouth and seal your lips tightly around it.  Breathe in slowly and as deeply as possible, raising the piston or the ball toward the top of the column.  Hold your breath for 3-5 seconds or for as long as possible. Allow the piston or ball to fall to the bottom of the column.  Remove the mouthpiece from your mouth and breathe out normally.  Rest for a few seconds and repeat Steps 1 through 7 at least 10 times every 1-2 hours when you are awake. Take your time and take a few normal breaths between deep breaths.  The spirometer may include an indicator to show your best effort. Use the indicator as a goal to work toward during each repetition.  After each set of 10 deep breaths, practice coughing to be sure your lungs are clear. If you have an incision (the cut made at the time of surgery), support your incision when coughing by placing a pillow or rolled up towels firmly against it. Once you are able to get out of bed, walk around indoors and cough well. You may stop using the incentive spirometer when instructed by your caregiver.  RISKS AND COMPLICATIONS  Take your time so you do not get dizzy or light-headed.  If you are in pain, you may need to take or ask for pain medication before doing incentive spirometry. It is  harder to take a deep  breath if you are having pain. AFTER USE  Rest and breathe slowly and easily.  It can be helpful to keep track of a log of your progress. Your caregiver can provide you with a simple table to help with this. If you are using the spirometer at home, follow these instructions: [place] IF:   You are having difficultly using the spirometer.  You have trouble using the spirometer as often as instructed.  Your pain medication is not giving enough relief while using the spirometer.  You develop fever of 100.5 F (38.1 C) or higher. SEEK IMMEDIATE MEDICAL CARE IF:   You cough up bloody sputum that had not been present before.  You develop fever of 102 F (38.9 C) or greater.  You develop worsening pain at or near the incision site. MAKE SURE YOU:   Understand these instructions.  Will watch your condition.  Will get help right away if you are not doing well or get worse. Document Released: 02/24/2007 Document Revised: 01/06/2012 Document Reviewed: 04/27/2007 ExitCare Patient Information 2014 [employer], [place].   ________________________________________________________________________  WHAT IS A BLOOD TRANSFUSION? Blood Transfusion Information  A transfusion is the replacement of blood or some of its parts. Blood is made up of multiple cells which provide different functions.  Red blood cells carry oxygen and are used for blood loss replacement.  White blood cells fight against infection.  Platelets control bleeding.  Plasma helps clot blood.  Other blood products are available for specialized needs, such as hemophilia or other clotting disorders. BEFORE THE TRANSFUSION  Who gives blood for transfusions?   Healthy volunteers who are fully evaluated to make sure their blood is safe. This is blood bank blood. Transfusion therapy is the safest it has ever been in the practice of medicine. Before blood is taken from a donor, a complete history is taken to make sure  that person has no history of diseases nor engages in risky social behavior (examples are intravenous drug use or sexual activity with multiple partners). The donor's travel history is screened to minimize risk of transmitting infections, such as malaria. The donated blood is tested for signs of infectious diseases, such as HIV and hepatitis. The blood is then tested to be sure it is compatible with you in order to minimize the chance of a transfusion reaction. If you or a relative donates blood, this is often done in anticipation of surgery and is not appropriate for emergency situations. It takes many days to process the donated blood. RISKS AND COMPLICATIONS Although transfusion therapy is very safe and saves many lives, the main dangers of transfusion include:   Getting an infectious disease.  Developing a transfusion reaction. This is an allergic reaction to something in the blood you were given. Every precaution is taken to prevent this. The decision to have a blood transfusion has been considered carefully by your caregiver before blood is given. Blood is not given unless the benefits outweigh the risks. AFTER THE TRANSFUSION  Right after receiving a blood transfusion, you will usually feel much better and more energetic. This is especially true if your red blood cells have gotten low (anemic). The transfusion raises the level of the red blood cells which carry oxygen, and this usually causes an energy increase.  The nurse administering the transfusion will monitor you carefully for complications. HOME CARE INSTRUCTIONS  No special instructions are needed after a transfusion. You may find your energy is better. Speak  with your caregiver about any limitations on activity for underlying diseases you may have. SEEK MEDICAL CARE IF:   Your condition is not improving after your transfusion.  You develop redness or irritation at the intravenous (IV) site. SEEK IMMEDIATE MEDICAL CARE IF:  Any of  the following symptoms occur over the next 12 hours:  Shaking chills.  You have a temperature by mouth above 102 F (38.9 C), not controlled by medicine.  Chest, back, or muscle pain.  People around you feel you are not acting correctly or are confused.  Shortness of breath or difficulty breathing.  Dizziness and fainting.  You get a rash or develop hives.  You have a decrease in urine output.  Your urine turns a dark color or changes to pink, red, or brown. Any of the following symptoms occur over the next 10 days:  You have a temperature by mouth above 102 F (38.9 C), not controlled by medicine.  Shortness of breath.  Weakness after normal activity.  The white part of the eye turns yellow (jaundice).  You have a decrease in the amount of urine or are urinating less often.  Your urine turns a dark color or changes to pink, red, or brown. Document Released: 10/11/2000 Document Revised: 01/06/2012 Document Reviewed: 05/30/2008 [facility] Patient Information 2014 [place], [place].  _______________________________________________________________________

## 2016-02-06 ENCOUNTER — Encounter: Payer: Self-pay | Admitting: Pulmonary Disease

## 2016-02-12 ENCOUNTER — Encounter (HOSPITAL_COMMUNITY): Payer: Self-pay | Admitting: *Deleted

## 2016-02-12 ENCOUNTER — Ambulatory Visit (HOSPITAL_COMMUNITY): Payer: Medicare Other | Admitting: Anesthesiology

## 2016-02-12 ENCOUNTER — Encounter (HOSPITAL_COMMUNITY)
Admission: RE | Disposition: A | Discharge status: Home / Self Care | Payer: Self-pay | Source: Ambulatory Visit | Attending: Orthopedic Surgery

## 2016-02-12 ENCOUNTER — Observation Stay (HOSPITAL_COMMUNITY)
Admission: RE | Admit: 2016-02-12 | Discharge: 2016-02-13 | Disposition: A | Discharge status: Home Health | Payer: Medicare Other | Source: Ambulatory Visit | Attending: Orthopedic Surgery | Admitting: Orthopedic Surgery

## 2016-02-12 DIAGNOSIS — Z8546 Personal history of malignant neoplasm of prostate: Secondary | ICD-10-CM | POA: Diagnosis not present

## 2016-02-12 DIAGNOSIS — Z96652 Presence of left artificial knee joint: Secondary | ICD-10-CM

## 2016-02-12 DIAGNOSIS — Z85828 Personal history of other malignant neoplasm of skin: Secondary | ICD-10-CM | POA: Diagnosis not present

## 2016-02-12 DIAGNOSIS — I10 Essential (primary) hypertension: Secondary | ICD-10-CM | POA: Insufficient documentation

## 2016-02-12 DIAGNOSIS — Z96659 Presence of unspecified artificial knee joint: Secondary | ICD-10-CM

## 2016-02-12 DIAGNOSIS — Z79899 Other long term (current) drug therapy: Secondary | ICD-10-CM | POA: Insufficient documentation

## 2016-02-12 DIAGNOSIS — M179 Osteoarthritis of knee, unspecified: Secondary | ICD-10-CM | POA: Diagnosis not present

## 2016-02-12 DIAGNOSIS — M1712 Unilateral primary osteoarthritis, left knee: Principal | ICD-10-CM | POA: Insufficient documentation

## 2016-02-12 DIAGNOSIS — K219 Gastro-esophageal reflux disease without esophagitis: Secondary | ICD-10-CM | POA: Insufficient documentation

## 2016-02-12 DIAGNOSIS — E7800 Pure hypercholesterolemia, unspecified: Secondary | ICD-10-CM | POA: Insufficient documentation

## 2016-02-12 HISTORY — PX: PARTIAL KNEE ARTHROPLASTY: SHX2174

## 2016-02-12 LAB — TYPE AND SCREEN
ABO/RH(D): A POS
ANTIBODY SCREEN: NEGATIVE

## 2016-02-12 SURGERY — ARTHROPLASTY, KNEE, UNICOMPARTMENTAL
Anesthesia: Spinal | Site: Knee | Laterality: Left

## 2016-02-12 MED ORDER — FERROUS SULFATE 325 (65 FE) MG PO TABS
325.0000 mg | ORAL_TABLET | Freq: Three times a day (TID) | ORAL | Status: DC
  Administered 2016-02-12 – 2016-02-13 (×2): 325 mg via ORAL
  Filled 2016-02-12 (×5): qty 1

## 2016-02-12 MED ORDER — SODIUM CHLORIDE 0.9 % IV SOLN
INTRAVENOUS | Status: DC
  Administered 2016-02-12: 18:00:00 via INTRAVENOUS
  Filled 2016-02-12 (×4): qty 1000

## 2016-02-12 MED ORDER — BUPIVACAINE-EPINEPHRINE (PF) 0.25% -1:200000 IJ SOLN
INTRAMUSCULAR | Status: AC
  Filled 2016-02-12: qty 30

## 2016-02-12 MED ORDER — LACTATED RINGERS IV SOLN
INTRAVENOUS | Status: DC
  Administered 2016-02-12: 15:00:00 via INTRAVENOUS

## 2016-02-12 MED ORDER — PROPOFOL 500 MG/50ML IV EMUL
INTRAVENOUS | Status: DC | PRN
  Administered 2016-02-12: 50 ug/kg/min via INTRAVENOUS

## 2016-02-12 MED ORDER — TRANEXAMIC ACID 1000 MG/10ML IV SOLN
1000.0000 mg | Freq: Once | INTRAVENOUS | Status: AC
  Administered 2016-02-12: 1000 mg via INTRAVENOUS
  Filled 2016-02-12: qty 10

## 2016-02-12 MED ORDER — METHOCARBAMOL 1000 MG/10ML IJ SOLN
500.0000 mg | Freq: Four times a day (QID) | INTRAVENOUS | Status: DC | PRN
  Administered 2016-02-12: 500 mg via INTRAVENOUS
  Filled 2016-02-12 (×2): qty 5

## 2016-02-12 MED ORDER — BUPIVACAINE-EPINEPHRINE 0.25% -1:200000 IJ SOLN
INTRAMUSCULAR | Status: DC | PRN
  Administered 2016-02-12: 30 mL

## 2016-02-12 MED ORDER — DEXAMETHASONE SODIUM PHOSPHATE 10 MG/ML IJ SOLN: 10.0000 mg | Freq: Once | INTRAMUSCULAR | Status: DC

## 2016-02-12 MED ORDER — HYDROMORPHONE HCL 1 MG/ML IJ SOLN: 0.2500 mg | INTRAMUSCULAR | Status: DC | PRN

## 2016-02-12 MED ORDER — PHENOL 1.4 % MT LIQD: 1.0000 | OROMUCOSAL | Status: DC | PRN

## 2016-02-12 MED ORDER — DEXAMETHASONE SODIUM PHOSPHATE 10 MG/ML IJ SOLN
10.0000 mg | Freq: Once | INTRAMUSCULAR | Status: AC
  Administered 2016-02-12: 10 mg via INTRAVENOUS

## 2016-02-12 MED ORDER — METOCLOPRAMIDE HCL 10 MG PO TABS: 5.0000 mg | ORAL_TABLET | Freq: Three times a day (TID) | ORAL | Status: DC | PRN

## 2016-02-12 MED ORDER — METOCLOPRAMIDE HCL 5 MG/ML IJ SOLN: 5.0000 mg | Freq: Three times a day (TID) | INTRAMUSCULAR | Status: DC | PRN

## 2016-02-12 MED ORDER — BUPIVACAINE IN DEXTROSE 0.75-8.25 % IT SOLN
INTRATHECAL | Status: DC | PRN
  Administered 2016-02-12: 1.8 mL via INTRATHECAL

## 2016-02-12 MED ORDER — VANCOMYCIN HCL IN DEXTROSE 1-5 GM/200ML-% IV SOLN
1000.0000 mg | INTRAVENOUS | Status: AC
  Administered 2016-02-12: 1000 mg via INTRAVENOUS
  Filled 2016-02-12: qty 200

## 2016-02-12 MED ORDER — PROPOFOL 10 MG/ML IV BOLUS
INTRAVENOUS | Status: AC
  Filled 2016-02-12: qty 60

## 2016-02-12 MED ORDER — CHLORHEXIDINE GLUCONATE 4 % EX LIQD: 60.0000 mL | Freq: Once | CUTANEOUS | Status: DC

## 2016-02-12 MED ORDER — VANCOMYCIN HCL IN DEXTROSE 1-5 GM/200ML-% IV SOLN
1000.0000 mg | Freq: Two times a day (BID) | INTRAVENOUS | Status: AC
  Administered 2016-02-12: 1000 mg via INTRAVENOUS
  Filled 2016-02-12: qty 200

## 2016-02-12 MED ORDER — ONDANSETRON HCL 4 MG/2ML IJ SOLN: 4.0000 mg | Freq: Four times a day (QID) | INTRAMUSCULAR | Status: DC | PRN

## 2016-02-12 MED ORDER — METHOCARBAMOL 500 MG PO TABS: 500.0000 mg | ORAL_TABLET | Freq: Four times a day (QID) | ORAL | Status: DC | PRN

## 2016-02-12 MED ORDER — KETOROLAC TROMETHAMINE 30 MG/ML IJ SOLN
INTRAMUSCULAR | Status: AC
  Filled 2016-02-12: qty 1

## 2016-02-12 MED ORDER — ONDANSETRON HCL 4 MG PO TABS: 4.0000 mg | ORAL_TABLET | Freq: Four times a day (QID) | ORAL | Status: DC | PRN

## 2016-02-12 MED ORDER — MAGNESIUM CITRATE PO SOLN: 1.0000 | Freq: Once | ORAL | Status: DC | PRN

## 2016-02-12 MED ORDER — DEXAMETHASONE SODIUM PHOSPHATE 10 MG/ML IJ SOLN
INTRAMUSCULAR | Status: AC
  Filled 2016-02-12: qty 1

## 2016-02-12 MED ORDER — KETOROLAC TROMETHAMINE 30 MG/ML IJ SOLN
INTRAMUSCULAR | Status: DC | PRN
  Administered 2016-02-12: 30 mg

## 2016-02-12 MED ORDER — BISACODYL 10 MG RE SUPP: 10.0000 mg | Freq: Every day | RECTAL | Status: DC | PRN

## 2016-02-12 MED ORDER — ALUM & MAG HYDROXIDE-SIMETH 200-200-20 MG/5ML PO SUSP: 30.0000 mL | ORAL | Status: DC | PRN

## 2016-02-12 MED ORDER — FAMOTIDINE 40 MG PO TABS
40.0000 mg | ORAL_TABLET | Freq: Every day | ORAL | Status: DC
  Administered 2016-02-12: 40 mg via ORAL
  Filled 2016-02-12 (×2): qty 1

## 2016-02-12 MED ORDER — CELECOXIB 200 MG PO CAPS
200.0000 mg | ORAL_CAPSULE | Freq: Two times a day (BID) | ORAL | Status: DC
  Administered 2016-02-12 – 2016-02-13 (×2): 200 mg via ORAL
  Filled 2016-02-12 (×3): qty 1

## 2016-02-12 MED ORDER — 0.9 % SODIUM CHLORIDE (POUR BTL) OPTIME
TOPICAL | Status: DC | PRN
  Administered 2016-02-12: 1000 mL

## 2016-02-12 MED ORDER — SODIUM CHLORIDE 0.9 % IJ SOLN
INTRAMUSCULAR | Status: AC
  Filled 2016-02-12: qty 50

## 2016-02-12 MED ORDER — SIMVASTATIN 20 MG PO TABS
20.0000 mg | ORAL_TABLET | Freq: Every evening | ORAL | Status: DC
  Administered 2016-02-12: 20 mg via ORAL
  Filled 2016-02-12 (×2): qty 1

## 2016-02-12 MED ORDER — DOCUSATE SODIUM 100 MG PO CAPS
100.0000 mg | ORAL_CAPSULE | Freq: Two times a day (BID) | ORAL | Status: DC
  Administered 2016-02-12 – 2016-02-13 (×2): 100 mg via ORAL

## 2016-02-12 MED ORDER — SODIUM CHLORIDE 0.9 % IJ SOLN
INTRAMUSCULAR | Status: DC | PRN
  Administered 2016-02-12: 30 mL

## 2016-02-12 MED ORDER — DIPHENHYDRAMINE HCL 25 MG PO CAPS: 25.0000 mg | ORAL_CAPSULE | Freq: Four times a day (QID) | ORAL | Status: DC | PRN

## 2016-02-12 MED ORDER — POLYETHYLENE GLYCOL 3350 17 G PO PACK
17.0000 g | PACK | Freq: Two times a day (BID) | ORAL | Status: DC
  Administered 2016-02-12: 17 g via ORAL

## 2016-02-12 MED ORDER — LACTATED RINGERS IV SOLN
INTRAVENOUS | Status: DC | PRN
  Administered 2016-02-12 (×2): via INTRAVENOUS

## 2016-02-12 MED ORDER — ASPIRIN EC 325 MG PO TBEC
325.0000 mg | DELAYED_RELEASE_TABLET | Freq: Two times a day (BID) | ORAL | Status: DC
  Administered 2016-02-13: 325 mg via ORAL
  Filled 2016-02-12 (×3): qty 1

## 2016-02-12 MED ORDER — HYDROCODONE-ACETAMINOPHEN 7.5-325 MG PO TABS
1.0000 | ORAL_TABLET | ORAL | Status: DC
  Filled 2016-02-12 (×2): qty 2

## 2016-02-12 MED ORDER — LACTATED RINGERS IV SOLN: INTRAVENOUS | Status: DC

## 2016-02-12 MED ORDER — MENTHOL 3 MG MT LOZG: 1.0000 | LOZENGE | OROMUCOSAL | Status: DC | PRN

## 2016-02-12 SURGICAL SUPPLY — 39 items
BAG DECANTER FOR FLEXI CONT (MISCELLANEOUS) IMPLANT
BAG SPEC THK2 15X12 ZIP CLS (MISCELLANEOUS)
BAG ZIPLOCK 12X15 (MISCELLANEOUS) IMPLANT
BANDAGE ACE 6X5 VEL STRL LF (GAUZE/BANDAGES/DRESSINGS) ×2 IMPLANT
BLADE SAW RECIPROCATING 77.5 (BLADE) ×2 IMPLANT
BLADE SAW SGTL 13.0X1.19X90.0M (BLADE) ×2 IMPLANT
BOWL SMART MIX CTS (DISPOSABLE) ×2 IMPLANT
CAPT KNEE PARTIAL 2 ×2 IMPLANT
CEMENT HV SMART SET (Cement) ×2 IMPLANT
CLOTH BEACON ORANGE TIMEOUT ST (SAFETY) ×2 IMPLANT
CUFF TOURN SGL QUICK 34 (TOURNIQUET CUFF) ×2
CUFF TRNQT CYL 34X4X40X1 (TOURNIQUET CUFF) ×1 IMPLANT
DRAPE U-SHAPE 47X51 STRL (DRAPES) ×2 IMPLANT
DRSG AQUACEL AG ADV 3.5X10 (GAUZE/BANDAGES/DRESSINGS) ×2 IMPLANT
DURAPREP 26ML APPLICATOR (WOUND CARE) ×4 IMPLANT
ELECT REM PT RETURN 9FT ADLT (ELECTROSURGICAL) ×2
ELECTRODE REM PT RTRN 9FT ADLT (ELECTROSURGICAL) ×1 IMPLANT
GLOVE BIOGEL M 7.0 STRL (GLOVE) IMPLANT
GLOVE BIOGEL PI IND STRL 7.5 (GLOVE) ×3 IMPLANT
GLOVE BIOGEL PI IND STRL 8.5 (GLOVE) ×1 IMPLANT
GLOVE BIOGEL PI INDICATOR 7.5 (GLOVE) ×3
GLOVE BIOGEL PI INDICATOR 8.5 (GLOVE) ×1
GLOVE ECLIPSE 8.0 STRL XLNG CF (GLOVE) ×8 IMPLANT
GLOVE ORTHO TXT STRL SZ7.5 (GLOVE) ×4 IMPLANT
GOWN STRL REUS W/TWL LRG LVL3 (GOWN DISPOSABLE) ×4 IMPLANT
GOWN STRL REUS W/TWL XL LVL3 (GOWN DISPOSABLE) ×4 IMPLANT
LEGGING LITHOTOMY PAIR STRL (DRAPES) ×2 IMPLANT
LIQUID BAND (GAUZE/BANDAGES/DRESSINGS) ×2 IMPLANT
MANIFOLD NEPTUNE II (INSTRUMENTS) ×2 IMPLANT
PACK TOTAL KNEE CUSTOM (KITS) ×2 IMPLANT
SUT MNCRL AB 4-0 PS2 18 (SUTURE) ×2 IMPLANT
SUT VIC AB 1 CT1 36 (SUTURE) ×2 IMPLANT
SUT VIC AB 2-0 CT1 27 (SUTURE) ×4
SUT VIC AB 2-0 CT1 TAPERPNT 27 (SUTURE) ×2 IMPLANT
SUT VLOC 180 0 24IN GS25 (SUTURE) ×2 IMPLANT
SYR 50ML LL SCALE MARK (SYRINGE) ×2 IMPLANT
TRAY FOLEY W/METER SILVER 14FR (SET/KITS/TRAYS/PACK) IMPLANT
TRAY FOLEY W/METER SILVER 16FR (SET/KITS/TRAYS/PACK) ×2 IMPLANT
WRAP KNEE MAXI GEL POST OP (GAUZE/BANDAGES/DRESSINGS) ×2 IMPLANT

## 2016-02-12 NOTE — Transfer of Care (Signed)
Immediate Anesthesia Transfer of Care Note  Patient: Darren Edwards  Procedure(s) Performed: Procedure(s): UNICOMPARTMENTAL LEFT KNEE MEDIALLY (Left)  Patient Location: PACU  Anesthesia Type:Spinal  Level of Consciousness:  sedated, patient cooperative and responds to stimulation  Airway & Oxygen Therapy:Patient Spontanous Breathing and Patient connected to face mask oxgen  Post-op Assessment:  Report given to PACU RN and Post -op Vital signs reviewed and stable  Post vital signs:  Reviewed and stable  Last Vitals:  Filed Vitals:   02/12/16 1005  BP: 165/75  Pulse: 73  Temp: 36.4 C  Resp: 18    Complications: No apparent anesthesia complications

## 2016-02-12 NOTE — Anesthesia Preprocedure Evaluation (Addendum)
Anesthesia Evaluation  Patient identified by MRN, date of birth, ID band Patient awake    Reviewed: Allergy & Precautions, H&P , NPO status , Patient's Chart, lab work & pertinent test results  Airway Mallampati: II  TM Distance: >3 FB Neck ROM: full    Dental no notable dental hx. (+) Dental Advisory Given, Teeth Intact   Pulmonary neg pulmonary ROS,    Pulmonary exam normal breath sounds clear to auscultation       Cardiovascular Exercise Tolerance: Good hypertension, Normal cardiovascular exam Rhythm:regular Rate:Normal     Neuro/Psych negative neurological ROS  negative psych ROS   GI/Hepatic negative GI ROS, Neg liver ROS,   Endo/Other  negative endocrine ROS  Renal/GU negative Renal ROS  negative genitourinary   Musculoskeletal   Abdominal   Peds  Hematology negative hematology ROS (+)   Anesthesia Other Findings   Reproductive/Obstetrics negative OB ROS                             Anesthesia Physical Anesthesia Plan  ASA: III  Anesthesia Plan: Spinal   Post-op Pain Management:    Induction:   Airway Management Planned:   Additional Equipment:   Intra-op Plan:   Post-operative Plan:   Informed Consent: I have reviewed the patients History and Physical, chart, labs and discussed the procedure including the risks, benefits and alternatives for the proposed anesthesia with the patient or authorized representative who has indicated his/her understanding and acceptance.   Dental Advisory Given  Plan Discussed with: CRNA and Surgeon  Anesthesia Plan Comments:        Anesthesia Quick Evaluation

## 2016-02-12 NOTE — Interval H&P Note (Signed)
History and Physical Interval Note:  02/12/2016 12:24 PM  Darren Edwards  has presented today for surgery, with the diagnosis of medial compartmental osteoarthritis left knee  The various methods of treatment have been discussed with the patient and family. After consideration of risks, benefits and other options for treatment, the patient has consented to  Procedure(s): UNICOMPARTMENTAL LEFT KNEE MEDIALLY (Left) as a surgical intervention .  The patient's history has been reviewed, patient examined, no change in status, stable for surgery.  I have reviewed the patient's chart and labs.  Questions were answered to the patient's satisfaction.     [name]

## 2016-02-12 NOTE — Op Note (Signed)
NAME: Darren Edwards    MEDICAL RECORD NO.: [id number]   FACILITY: [facility] OF BIRTH: Jun 09, 1929  PHYSICIAN: Pietro Cassis. Alvan Dame, M.D.    DATE OF PROCEDURE: 02/12/2016    OPERATIVE REPORT   PREOPERATIVE DIAGNOSIS: Left knee medial compartment osteoarthritis.   POSTOPERATIVE DIAGNOSIS: Left knee medial compartment osteoarthritis.  PROCEDURE: Left partial knee replacement utilizing Biomet Oxford knee  component, size SMALL femur, a left medial size C tibial tray with a size 4 mm insert.   SURGEON: Pietro Cassis. Alvan Dame, M.D.   ASSISTANT: Danae Orleans, PAC.  Please note that [name] was present for the entirety of the case,  utilized for preoperative positioning, perioperative retractor  management, general facilitation of the case and primary wound closure.   ANESTHESIA: spinal.   SPECIMENS: None.   COMPLICATIONS: None.  DRAINS: None   TOURNIQUET TIME: 33 minutes at 250 mmHg.   INDICATIONS FOR PROCEDURE: The patient is a 82 y.o. patient of mine who presented for evaluation of left knee pain.  They presented with primary complaints of pain on the medial side of their knee. Radiographs revealed advanced medial compartment arthritis with specifically an antero-medial wear pattern.  There was bone on bone changes noted with subchondral sclerosis and osteophytes present. The patient has had progressive problems failing to respond to conservative measures of medications, injections and activity modification. Risks of infection, DVT, component failure, need for future revision surgery were all discussed and reviewed.  Consent was obtained for benefit of pain relief.   PROCEDURE IN DETAIL: The patient was brought to the operative theater.  Once adequate anesthesia, preoperative antibiotics,  1 gm of Vancomycin, 1 gm of Tranexamic Acid, and 10 mg of Decadron administered, the patient was positioned in supine position with a left thigh tourniquet  placed. The left lower extremity was  prepped and draped in sterile  fashion with the leg on the Oxford leg holder.  The leg was allowed to flex to 120 degrees. A time-out  was performed identifying the patient, planned procedure, and extremity.  The leg was exsanguinated, tourniquet elevated to 250 mmHg. A midline  incision was made from the proximal pole of the patella to the tibial tubercle. A  soft tissue plane was created and partial median arthrotomy was then  made to allow for subluxation of the patella. Following initial synovectomy and  debridement, the osteophytes were removed off the medial aspect of the  knee.   Attention was first directed to the tibia. The tibial  extramedullary guide was positioned over the anterior crest of the tibia  and pinned into position, and using a measured resection guide from the  [employer] system, a 4 mm resection was made off the proximal tibia. First  the reciprocating saw along the medial aspect of the tibial spines, then the oscillating saw.    At this point, I sized this cut surface seem to be best fit for a size A tibial tray.  With the retractors out of the wound and the knee held at 90 degrees the size 4 feeler gauge had appropriate tension on the medial ligament.   At this point, the femoral canal was opened with a drill and the  intramedullary rod passed. Then using the guide for a smallresection off  the posterior aspect of the femur was positioned over the mid portion of the medial femoral condyle.  The orientation was set using the guide that mates the femoral guide to the intramedullary rod.  The 2 drill  holes were made into the distal femur.  The posterior guide was then impacted into place and the posterior  femoral cut made.  At this point, I milled the distal femur with a size 4 spigot in place. At this point, we did a trial reduction of the small femur, size left A medial tibial tray and a size 4 mm insert. At 90 degrees of flexion the 4 feeler gauge felt appropriate  with regards to ligaments but at 20 degrees the 3 felt symmetric.  Given the difference in the tension between the knee in 90 degrees versus that in 20 degrees I had to place the 5 spigot into the femur and re-mill the distal femur.  Remaining bone was removed and debrided.  I repeated the trial reduction and found that now at both 90 degrees and 20 degrees the knee ligament were tension symmetrically with the 4 feeler gauge.  Given these findings, the trial femoral component was removed. Final preparation of tibia was carried out by pinning it in position. Then  using a reciprocating saw I removed bone for the keel. Further bone was  removed with an osteotome.  Trial reduction was now carried out with the small femur, the left medial A tibia, and a size 4 lollipop insert. The balance of the  ligaments appeared to be symmetric at 20 degrees and 90 degrees. Given  all these findings, the trial components were removed.   Cement was mixed. The final components were opened. The knee was irrigated with  normal saline solution. Then final debridements of the  soft tissue was carried out, I also drilled the sclerotic bone with a drill.  The final components were cemented with a single batch of cement in a  two-stage technique with the tibial component cemented first. The knee  was then brought  to 45 degrees of flexion with a size 4 feeler gauge, held with pressure for a minute and half.  After this the femoral component was cemented in place.  The knee was again held at 45 degrees of flexion while the cement fully cured.  Excess cement was removed throughout the knee. Tourniquet was let down  after 33 minutes. After the cement had fully cured and excessive cement  was removed throughout the knee there was no visualized cement present.   The final left medial small 4 mm was chosen and snapped into position. We re-irrigated  the knee. The extensor mechanism  was then reapproximated using a #1 Vicryl  with the knee in flexion. The  remaining wound was closed with 2-0 Vicryl and a running 4-0 Monocryl.  The knee was cleaned, dried, and dressed sterilely using Dermabond and  Aquacel dressing. The patient  was brought to the recovery room, Ace wrap in place, tolerating the  procedure well. He will be in the hospital for overnight observation.  We will initiate physical therapy and progress to ambulate.     Pietro Cassis Alvan Dame, M.D.

## 2016-02-13 DIAGNOSIS — Z8546 Personal history of malignant neoplasm of prostate: Secondary | ICD-10-CM | POA: Diagnosis not present

## 2016-02-13 DIAGNOSIS — Z85828 Personal history of other malignant neoplasm of skin: Secondary | ICD-10-CM | POA: Diagnosis not present

## 2016-02-13 DIAGNOSIS — E7800 Pure hypercholesterolemia, unspecified: Secondary | ICD-10-CM | POA: Diagnosis not present

## 2016-02-13 DIAGNOSIS — I10 Essential (primary) hypertension: Secondary | ICD-10-CM | POA: Diagnosis not present

## 2016-02-13 DIAGNOSIS — M1712 Unilateral primary osteoarthritis, left knee: Secondary | ICD-10-CM | POA: Diagnosis not present

## 2016-02-13 DIAGNOSIS — K219 Gastro-esophageal reflux disease without esophagitis: Secondary | ICD-10-CM | POA: Diagnosis not present

## 2016-02-13 LAB — CBC
HCT: 39.1 % (ref 39.0–52.0)
HEMOGLOBIN: 13.1 g/dL (ref 13.0–17.0)
MCH: 30.9 pg (ref 26.0–34.0)
MCHC: 33.5 g/dL (ref 30.0–36.0)
MCV: 92.2 fL (ref 78.0–100.0)
PLATELETS: 258 10*3/uL (ref 150–400)
RBC: 4.24 MIL/uL (ref 4.22–5.81)
RDW: 13.2 % (ref 11.5–15.5)
WBC: 11.3 10*3/uL — ABNORMAL HIGH (ref 4.0–10.5)

## 2016-02-13 LAB — BASIC METABOLIC PANEL
ANION GAP: 11 (ref 5–15)
BUN: 19 mg/dL (ref 6–20)
CALCIUM: 8.8 mg/dL — AB (ref 8.9–10.3)
CO2: 25 mmol/L (ref 22–32)
Chloride: 108 mmol/L (ref 101–111)
Creatinine, Ser: 0.9 mg/dL (ref 0.61–1.24)
Glucose, Bld: 132 mg/dL — ABNORMAL HIGH (ref 65–99)
POTASSIUM: 4.5 mmol/L (ref 3.5–5.1)
SODIUM: 144 mmol/L (ref 135–145)

## 2016-02-13 MED ORDER — FERROUS SULFATE 325 (65 FE) MG PO TABS: 325.0000 mg | ORAL_TABLET | Freq: Three times a day (TID) | ORAL | Status: DC

## 2016-02-13 MED ORDER — HYDROCODONE-ACETAMINOPHEN 7.5-325 MG PO TABS: 1.0000 | ORAL_TABLET | ORAL | Status: DC | PRN

## 2016-02-13 MED ORDER — TIZANIDINE HCL 4 MG PO TABS
4.0000 mg | ORAL_TABLET | Freq: Four times a day (QID) | ORAL | Status: DC | PRN
Start: 2016-02-13 — End: 2020-05-12

## 2016-02-13 MED ORDER — DOCUSATE SODIUM 100 MG PO CAPS: 100.0000 mg | ORAL_CAPSULE | Freq: Two times a day (BID) | ORAL | Status: DC

## 2016-02-13 MED ORDER — POLYETHYLENE GLYCOL 3350 17 G PO PACK: 17.0000 g | PACK | Freq: Two times a day (BID) | ORAL | Status: DC

## 2016-02-13 MED ORDER — CELECOXIB 200 MG PO CAPS: 200.0000 mg | ORAL_CAPSULE | Freq: Two times a day (BID) | ORAL | Status: DC

## 2016-02-13 MED ORDER — ASPIRIN 325 MG PO TBEC: 325.0000 mg | DELAYED_RELEASE_TABLET | Freq: Two times a day (BID) | ORAL | Status: AC

## 2016-02-13 NOTE — Progress Notes (Signed)
Physical Therapy Treatment Note    02/13/16 1500  PT Visit Information  Last PT Received On 02/13/16  Assistance Needed +1  History of Present Illness Pt is an 82 year old s/p Left knee medial compartment knee replacement  PT Time Calculation  PT Start Time (ACUTE ONLY) 1317  PT Stop Time (ACUTE ONLY) 1333  PT Time Calculation (min) (ACUTE ONLY) 16 min  Subjective Data  Subjective Pt ambulated and practiced safe stair technique.  Pt feels comfortable with d/c home today.  Precautions  Precautions Knee  Restrictions  LLE Weight Bearing WBAT  Pain Assessment  Pain Assessment 0-10  Pain Score 2  Pain Location L knee  Pain Descriptors / Indicators Aching;Sore  Pain Intervention(s) Limited activity within patient's tolerance;Repositioned;Ice applied  Cognition  Arousal/Alertness Awake/alert  Behavior During Therapy WFL for tasks assessed/performed  Overall Cognitive Status Within Functional Limits for tasks assessed  Transfers  Overall transfer level Needs assistance  Equipment used Rolling walker (2 wheeled)  Transfers Sit to/from Stand  Sit to Stand Supervision  General transfer comment supervision for safety, recalled appropriate positioning  Ambulation/Gait  Ambulation/Gait assistance Supervision  Ambulation Distance (Feet) 260 Feet  Assistive device Rolling walker (2 wheeled)  Gait Pattern/deviations Antalgic;Step-through pattern  General Gait Details mobilizing well  Stairs Yes  Stairs assistance Min guard  Stair Management Step to pattern;Forwards;One rail Left  Number of Stairs 4  General stair comments pt performed 4 steps x3 requiring less verbal cues for technique and safety, family present and observed  PT - End of Session  Activity Tolerance Patient tolerated treatment well  Patient left in chair;with call bell/phone within reach;with family/visitor present  PT - Assessment/Plan  PT Plan Current plan remains appropriate  PT Frequency (ACUTE ONLY) 7X/week   Follow Up Recommendations Outpatient PT;Home health PT (per MD)  PT equipment Rolling walker with 5" wheels  PT Goal Progression  Progress towards PT goals Progressing toward goals  PT General Charges  $$ ACUTE PT VISIT 1 Procedure  PT Treatments  $Gait Training 8-22 mins   Carmelia Bake, PT, DPT 02/13/2016 Pager: [phone]

## 2016-02-13 NOTE — Care Management Note (Signed)
Case Management Note  Patient Details  Name: Darren Edwards MRN: [id number] Date of Birth: 1929-01-04  Subjective/Objective:   S/p Left knee medial compartment osteoarthritis                 Action/Plan: Discharge planning, spoke with patient and spouse at bedside. Have chosen [facility] for [facility]. Contacted [facility] for referral. Has RW, declines 3-n-1.  Expected Discharge Date:                  Expected Discharge Plan:  [place]  In-House Referral:  NA  Discharge planning Services  CM Consult  Post Acute Care Choice:  Home Health Choice offered to:  Spouse, Patient  DME Arranged:  N/A DME Agency:  NA  HH Arranged:  PT HH Agency:  [facility]  Status of Service:  Completed, signed off  Medicare Important Message Given:    Date Medicare IM Given:    Medicare IM give by:    Date Additional Medicare IM Given:    Additional Medicare Important Message give by:     If discussed at [place] of Stay Meetings, dates discussed:    Additional Comments:  Guadalupe Maple, RN 02/13/2016, 11:28 AM [phone]

## 2016-02-13 NOTE — Progress Notes (Signed)
     Subjective: 1 Day Post-Op Procedure(s) (LRB): UNICOMPARTMENTAL LEFT KNEE MEDIALLY (Left)   Seen By Dr. Alvan Dame. Patient reports pain as mild, pain controlled. No events throughout the night.  Ready to be discharged home.   Objective:   VITALS:   Filed Vitals:   02/13/16 0532 02/13/16 0947  BP: 153/74 142/78  Pulse: 73 81  Temp: 97.9 F (36.6 C) 97.4 F (36.3 C)  Resp: 16 16    Dorsiflexion/Plantar flexion intact Incision: dressing C/D/I No cellulitis present Compartment soft  LABS  Recent Labs  02/13/16 0353  HGB 13.1  HCT 39.1  WBC 11.3*  PLT 258     Recent Labs  02/13/16 0353  NA 144  K 4.5  BUN 19  CREATININE 0.90  GLUCOSE 132*     Assessment/Plan: 1 Day Post-Op Procedure(s) (LRB): UNICOMPARTMENTAL LEFT KNEE MEDIALLY (Left) Foley cath d/c'ed Advance diet Up with therapy D/C IV fluids Discharge home with home health Follow up in 2 weeks at [facility]. Follow up with OLIN,Amylah Will D in 2 weeks.  Contact information:  [facility] [address], [address] [place]   PAC  02/13/2016, 10:05 AM

## 2016-02-13 NOTE — Evaluation (Signed)
Occupational Therapy Evaluation AND Discharge  Patient Details Name: Darren Edwards MRN: [id number] DOB: 07-Mar-1929 Today's Date: 02/13/2016    History of Present Illness Pt is an 82 year old s/p Left knee medial compartment knee replacement   Clinical Impression   Patient admitted with above. Patient independent PTA. Patient currently functioning at an overall supervision level.  No additional OT needs identified, D/C from acute OT services and no additional follow-up OT needs at this time. All appropriate education provided to patient and family (wife and daughters). Please re-order OT if needed.      Follow Up Recommendations  No OT follow up;Supervision - Intermittent    Equipment Recommendations  None recommended by OT (Pt refusing 3-n-1)    Recommendations for Other Services  None at this time   Precautions / Restrictions Precautions Precautions: Knee Precaution Comments: Reviewed knee precautions and no pillow under knee  Restrictions Weight Bearing Restrictions: Yes LLE Weight Bearing: Weight bearing as tolerated   Mobility Bed Mobility General bed mobility comments: Pt found seated in recliner upon OT entering/exiting room   Transfers Overall transfer level: Needs assistance Equipment used: Rolling walker (2 wheeled) Transfers: Sit to/from Stand Sit to Stand: Supervision  General transfer comment: verbal cues for hand placement    Balance Overall balance assessment: Needs assistance Sitting-balance support: No upper extremity supported;Feet supported Sitting balance-Leahy Scale: Good     Standing balance support: Bilateral upper extremity supported;During functional activity Standing balance-Leahy Scale: Good Standing balance comment: reliant on RW    ADL Overall ADL's : Needs assistance/impaired Eating/Feeding: Set up;Sitting   Grooming: Set up;Sitting;Standing   Upper Body Bathing: Set up;Sitting   Lower Body Bathing: Set up;Supervison/ safety;Sit  to/from stand   Upper Body Dressing : Set up;Sitting   Lower Body Dressing: Set up;Supervision/safety;Sit to/from stand   Toilet Transfer: Supervision/safety;Comfort height toilet;Ambulation;RW Tub/Shower Transfer Details (indicate cue type and reason): Did not occur, did discuss use of tub transfer bench in tub/shower combo. Wife and pt state they understand how to use this safely and appropriately.  Functional mobility during ADLs: Supervision/safety;Cueing for safety;Rolling walker      Vision Vision Assessment?: No apparent visual deficits          Pertinent Vitals/Pain Pain Assessment: Faces Pain Score: 2  Faces Pain Scale: Hurts a little bit Pain Location: L knee with mobility  Pain Descriptors / Indicators: Sore Pain Intervention(s): Monitored during session;Repositioned     Hand Dominance Right   Extremity/Trunk Assessment Upper Extremity Assessment Upper Extremity Assessment: Overall WFL for tasks assessed   Lower Extremity Assessment Lower Extremity Assessment: Defer to PT evaluation LLE Deficits / Details: able to perform SLR, knee flexion AROM 110* sitting   Cervical / Trunk Assessment Cervical / Trunk Assessment: Normal   Communication Communication Communication: No difficulties   Cognition Arousal/Alertness: Awake/alert Behavior During Therapy: WFL for tasks assessed/performed Overall Cognitive Status: Within Functional Limits for tasks assessed             Home Living Family/patient expects to be discharged to:: Private residence Living Arrangements: Spouse/significant other Available Help at Discharge: Family;Available 24 hours/day Type of Home: House       Home Layout: Two level Alternate Level Stairs-Number of Steps: flight   Bathroom Shower/Tub: Tub/shower unit;Curtain   Bathroom Toilet: Handicapped height     Home Equipment: Environmental consultant - 2 wheels;Tub bench   Additional Comments: spouse's RW and tub transfer bench       Prior  Functioning/Environment Level of Independence: Independent  Comments: very physically active    OT Diagnosis: Generalized weakness;Acute pain   OT Problem List:   N/a, no acute OT needs identified at this time     OT Treatment/Interventions:   N/a, no acute OT needs identified at this time     OT Goals(Current goals can be found in the care plan section) Acute Rehab OT Goals Patient Stated Goal: Go home today  OT Goal Formulation: All assessment and education complete, DC therapy  OT Frequency:   N/a, no acute OT needs identified at this time     Barriers to D/C:  none known at this time    End of Session Equipment Utilized During Treatment: Gait belt;Rolling walker Nurse Communication: Other (comment) (Did not set bed alarm secondary to family in room, notified NT of this)  Activity Tolerance: Patient tolerated treatment well Patient left: in chair;with call bell/phone within reach;with family/visitor present   Time: [id number] OT Time Calculation (min): 16 min Charges:  OT General Charges $OT Visit: 1 Procedure OT Evaluation $OT Eval Low Complexity: 1 Procedure G-Codes: OT G-codes **NOT FOR INPATIENT CLASS** Functional Limitation: Self care Self Care Current Status [id number]): At least 1 percent but less than 20 percent impaired, limited or restricted Self Care Goal Status [id number]): At least 1 percent but less than 20 percent impaired, limited or restricted Self Care Discharge Status [phone]): At least 1 percent but less than 20 percent impaired, limited or restricted  Chrys Racer , MS, OTR/L, CLT Pager: [phone]  02/13/2016, 12:55 PM

## 2016-02-13 NOTE — Evaluation (Addendum)
Physical Therapy Evaluation Patient Details Name: Darren Edwards MRN: [id number] DOB: 02-05-1929 Today's Date: 02/13/2016   History of Present Illness  Pt is an 82 year old s/p Left knee medial compartment knee replacement  Clinical Impression  Patient is s/p above surgery resulting in functional limitations due to the deficits listed below (see PT Problem List).  Patient will benefit from skilled PT to increase their independence and safety with mobility to allow discharge to the venue listed below.   Pt mobilizing very well and anticipate d/c home later today.  Pt would like to attempt flight of stairs prior to d/c.     Follow Up Recommendations Home health PT;Outpatient PT (per MD)    Equipment Recommendations  Rolling walker with 5" wheels    Recommendations for Other Services       Precautions / Restrictions Precautions Precautions: Knee Restrictions Other Position/Activity Restrictions: WBAT      Mobility  Bed Mobility Overal bed mobility: Modified Independent                Transfers Overall transfer level: Needs assistance Equipment used: Rolling walker (2 wheeled) Transfers: Sit to/from Stand Sit to Stand: Min guard         General transfer comment: verbal cues for hand placement  Ambulation/Gait Ambulation/Gait assistance: Min guard Ambulation Distance (Feet): 200 Feet Assistive device: Rolling walker (2 wheeled) Gait Pattern/deviations: Step-through pattern;Antalgic;Decreased stance time - left     General Gait Details: verbal cues initially for sequence and RW positioning, tolerated distance well  Stairs            Wheelchair Mobility    Modified Rankin (Stroke Patients Only)       Balance                                             Pertinent Vitals/Pain Pain Assessment: 0-10 Pain Score: 2  Pain Location: L knee Pain Descriptors / Indicators: Sore Pain Intervention(s): Limited activity within patient's  tolerance;Monitored during session;Repositioned;Ice applied    Home Living Family/patient expects to be discharged to:: Private residence Living Arrangements: Spouse/significant other   Type of Home: House       Home Layout: Two level Home Equipment: Environmental consultant - 2 wheels Additional Comments: spouse's RW    Prior Function Level of Independence: Independent         Comments: very physically active     Hand Dominance        Extremity/Trunk Assessment               Lower Extremity Assessment: LLE deficits/detail   LLE Deficits / Details: able to perform SLR, knee flexion AROM 110* sitting     Communication   Communication: No difficulties  Cognition Arousal/Alertness: Awake/alert Behavior During Therapy: WFL for tasks assessed/performed Overall Cognitive Status: Within Functional Limits for tasks assessed                      General Comments      Exercises Total Joint Exercises Ankle Circles/Pumps: AROM;Both;10 reps Quad Sets: AROM;Both;10 reps Short Arc Quad: AROM;Left;10 reps Heel Slides: AROM;Seated;Left;10 reps Hip ABduction/ADduction: AROM;Left;10 reps Straight Leg Raises: AROM;Left;10 reps      Assessment/Plan    PT Assessment Patient needs continued PT services  PT Diagnosis Difficulty walking;Acute pain   PT Problem List Decreased strength;Decreased range of motion;Pain;Decreased knowledge  of use of DME;Decreased mobility  PT Treatment Interventions Functional mobility training;Stair training;Gait training;DME instruction;Patient/family education;Therapeutic activities;Therapeutic exercise   PT Goals (Current goals can be found in the Care Plan section) Acute Rehab PT Goals PT Goal Formulation: With patient Time For Goal Achievement: 02/16/16 Potential to Achieve Goals: Good    Frequency 7X/week   Barriers to discharge        Co-evaluation               End of Session   Activity Tolerance: Patient tolerated treatment  well Patient left: in chair;with call bell/phone within reach Nurse Communication: Mobility status    Functional Assessment Tool Used: clinical judgement Functional Limitation: Mobility: Walking and moving around Mobility: Walking and Moving Around Current Status [id number]): At least 20 percent but less than 40 percent impaired, limited or restricted Mobility: Walking and Moving Around Goal Status [phone]): At least 1 percent but less than 20 percent impaired, limited or restricted    Time: 0855-0926 PT Time Calculation (min) (ACUTE ONLY): 31 min   Charges:   PT Evaluation $PT Eval Low Complexity: 1 Procedure PT Treatments $Therapeutic Exercise: 8-22 mins   PT G Codes:   PT G-Codes **NOT FOR INPATIENT CLASS** Functional Assessment Tool Used: clinical judgement Functional Limitation: Mobility: Walking and moving around Mobility: Walking and Moving Around Current Status [id number]): At least 20 percent but less than 40 percent impaired, limited or restricted Mobility: Walking and Moving Around Goal Status [phone]): At least 1 percent but less than 20 percent impaired, limited or restricted    Darren Edwards,KATHrine E 02/13/2016, 12:30 PM Carmelia Bake, PT, DPT 02/13/2016 Pager: [phone]

## 2016-02-13 NOTE — Care Management Obs Status (Signed)
[place] NOTIFICATION   Patient Details  Name: Darren Edwards MRN: [id number] Date of Birth: 1929/06/27   Medicare Observation Status Notification Given:  Yes    Guadalupe Maple, RN 02/13/2016, 11:45 AM

## 2016-02-13 NOTE — Anesthesia Postprocedure Evaluation (Signed)
Anesthesia Post Note  Patient: Darren Edwards  Procedure(s) Performed: Procedure(s) (LRB): UNICOMPARTMENTAL LEFT KNEE MEDIALLY (Left)  Patient location during evaluation: PACU Anesthesia Type: Spinal Level of consciousness: awake and alert Pain management: pain level controlled Vital Signs Assessment: post-procedure vital signs reviewed and stable Respiratory status: spontaneous breathing, nonlabored ventilation, respiratory function stable and patient connected to nasal cannula oxygen Cardiovascular status: blood pressure returned to baseline and stable Postop Assessment: no signs of nausea or vomiting Anesthetic complications: no    Last Vitals:  Filed Vitals:   02/13/16 0532 02/13/16 0947  BP: 153/74 142/78  Pulse: 73 81  Temp: 36.6 C 36.3 C  Resp: 16 16    Last Pain:  Filed Vitals:   02/13/16 0947  PainSc: 0-No pain                 Wyley Hack L

## 2016-02-13 NOTE — Discharge Instructions (Signed)

## 2016-02-14 DIAGNOSIS — M519 Unspecified thoracic, thoracolumbar and lumbosacral intervertebral disc disorder: Secondary | ICD-10-CM | POA: Diagnosis not present

## 2016-02-14 DIAGNOSIS — M7062 Trochanteric bursitis, left hip: Secondary | ICD-10-CM | POA: Diagnosis not present

## 2016-02-14 DIAGNOSIS — Z96652 Presence of left artificial knee joint: Secondary | ICD-10-CM | POA: Diagnosis not present

## 2016-02-14 DIAGNOSIS — Z471 Aftercare following joint replacement surgery: Secondary | ICD-10-CM | POA: Diagnosis not present

## 2016-02-14 DIAGNOSIS — I10 Essential (primary) hypertension: Secondary | ICD-10-CM | POA: Diagnosis not present

## 2016-02-14 DIAGNOSIS — J45909 Unspecified asthma, uncomplicated: Secondary | ICD-10-CM | POA: Diagnosis not present

## 2016-02-16 ENCOUNTER — Telehealth: Payer: Self-pay | Admitting: Pulmonary Disease

## 2016-02-16 DIAGNOSIS — J45909 Unspecified asthma, uncomplicated: Secondary | ICD-10-CM | POA: Diagnosis not present

## 2016-02-16 DIAGNOSIS — Z471 Aftercare following joint replacement surgery: Secondary | ICD-10-CM | POA: Diagnosis not present

## 2016-02-16 DIAGNOSIS — Z96652 Presence of left artificial knee joint: Secondary | ICD-10-CM | POA: Diagnosis not present

## 2016-02-16 DIAGNOSIS — I10 Essential (primary) hypertension: Secondary | ICD-10-CM | POA: Diagnosis not present

## 2016-02-16 DIAGNOSIS — M7062 Trochanteric bursitis, left hip: Secondary | ICD-10-CM | POA: Diagnosis not present

## 2016-02-16 DIAGNOSIS — M519 Unspecified thoracic, thoracolumbar and lumbosacral intervertebral disc disorder: Secondary | ICD-10-CM | POA: Diagnosis not present

## 2016-02-16 NOTE — Telephone Encounter (Signed)
Spoke with Mechele Claude, PT with [facility].  She seen pt for first time on Wed after his total knee replace and b/p was 122/84.  Saw pt today and b/p was 160/100.  Pt not having much pain.  Only taking tylenol prn and muscle relaxer at night.  The only med change is that pt was d/ced on Celebrex daily.  No other symptoms reported. Pt has b/p cuff at home and knows how to use it.  Please advise of any other changes.

## 2016-02-16 NOTE — Telephone Encounter (Signed)
Per verbal order from SN Monitor BP at home No salt Decrease Celebrex to PRN Call if BP remains elevated.  Called spoke with Mechele Claude. Reviewed SN's recs. Pt voiced understanding and had no further questions. Nothing further needed.

## 2016-02-19 DIAGNOSIS — Z96652 Presence of left artificial knee joint: Secondary | ICD-10-CM | POA: Diagnosis not present

## 2016-02-19 DIAGNOSIS — M519 Unspecified thoracic, thoracolumbar and lumbosacral intervertebral disc disorder: Secondary | ICD-10-CM | POA: Diagnosis not present

## 2016-02-19 DIAGNOSIS — M7062 Trochanteric bursitis, left hip: Secondary | ICD-10-CM | POA: Diagnosis not present

## 2016-02-19 DIAGNOSIS — Z471 Aftercare following joint replacement surgery: Secondary | ICD-10-CM | POA: Diagnosis not present

## 2016-02-19 DIAGNOSIS — J45909 Unspecified asthma, uncomplicated: Secondary | ICD-10-CM | POA: Diagnosis not present

## 2016-02-19 DIAGNOSIS — I10 Essential (primary) hypertension: Secondary | ICD-10-CM | POA: Diagnosis not present

## 2016-02-20 NOTE — Discharge Summary (Signed)
Physician Discharge Summary  Patient ID: Darren Edwards MRN: [id number] DOB/AGE: 05-02-1929 82 y.o.  Admit date: 02/12/2016 Discharge date: 02/13/2016   Procedures:  Procedure(s) (LRB): UNICOMPARTMENTAL LEFT KNEE MEDIALLY (Left)  Attending Physician:  Dr. Paralee Cancel   Admission Diagnoses:   Left knee medial compartmental primary OA /pain  Discharge Diagnoses:  Principal Problem:   S/P left UKR Active Problems:   Status post unilateral knee replacement  Past Medical History  Diagnosis Date  . Hypercholesteremia   . Arthritis   . GERD (gastroesophageal reflux disease)   . Cancer ([place])     HX SKIN CANCERS / HX PROSTATE CANCER    HPI:    Darren Edwards, 82 y.o. male male, has a history of pain and functional disability in the left and has failed non-surgical conservative treatments for greater than 12 weeks to include NSAID's and/or analgesics, corticosteriod injections, viscosupplementation injections and activity modification. Onset of symptoms was gradual, starting 3 years ago with gradually worsening course since that time. The patient noted no past surgery on the left knee(s). Patient currently rates pain in the left knee(s) at 8 out of 10 with activity. Patient has worsening of pain with activity and weight bearing, pain that interferes with activities of daily living, pain with passive range of motion, crepitus and joint swelling. Patient has evidence of periarticular osteophytes and joint space narrowing of the medial compartment by imaging studies. There is no active infection. Risks, benefits and expectations were discussed with the patient. Risks including but not limited to the risk of anesthesia, blood clots, nerve damage, blood vessel damage, failure of the prosthesis, infection and up to and including death. Patient understand the risks, benefits and expectations and wishes to proceed with surgery.   PCP: Noralee Space, MD   Discharged Condition:  good  Hospital Course:  Patient underwent the above stated procedure on 02/12/2016. Patient tolerated the procedure well and brought to the recovery room in good condition and subsequently to the floor.  POD #1 BP: 142/78 ; Pulse: 81 ; Temp: 97.4 F (36.3 C) ; Resp: 16 Patient reports pain as mild, pain controlled. No events throughout the night. Ready to be discharged home.  Dorsiflexion/plantar flexion intact, incision: dressing C/D/I, no cellulitis present and compartment soft.   LABS  Basename    HGB     13.1  HCT     39.1    Discharge Exam: General appearance: alert, cooperative and no distress Extremities: Homans sign is negative, no sign of DVT, no edema, redness or tenderness in the calves or thighs and no ulcers, gangrene or trophic changes  Disposition: Home with follow up in 2 weeks   Follow-up Information    Follow up with [name], MD. Schedule an appointment as soon as possible for a visit in 2 weeks.   Specialty:  Orthopedic Surgery   Contact information:   [address] [place] [address] [phone]       Follow up with [facility].   Why:  physical therapy   Contact information:   [place] [address] [phone]       Discharge Instructions    Call MD / Call 911    Complete by:  As directed   If you experience chest pain or shortness of breath, CALL 911 and be transported to the hospital emergency room.  If you develope a fever above 101 F, pus (white drainage) or increased drainage or redness  at the wound, or calf pain, call your surgeon's office.     Change dressing    Complete by:  As directed   Maintain surgical dressing until follow up in the clinic. If the edges start to pull up, may reinforce with tape. If the dressing is no longer working, may remove and cover with gauze and tape, but must keep the area dry and clean.  Call with any questions or concerns.     Constipation  Prevention    Complete by:  As directed   Drink plenty of fluids.  Prune juice may be helpful.  You may use a stool softener, such as Colace (over the counter) 100 mg twice a day.  Use MiraLax (over the counter) for constipation as needed.     Diet - low sodium heart healthy    Complete by:  As directed      Discharge instructions    Complete by:  As directed   Maintain surgical dressing until follow up in the clinic. If the edges start to pull up, may reinforce with tape. If the dressing is no longer working, may remove and cover with gauze and tape, but must keep the area dry and clean.  Follow up in 2 weeks at [facility]. Call with any questions or concerns.     Increase activity slowly as tolerated    Complete by:  As directed   Weight bearing as tolerated with assist device (walker, cane, etc) as directed, use it as long as suggested by your surgeon or therapist, typically at least 4-6 weeks.     TED hose    Complete by:  As directed   Use stockings (TED hose) for 2 weeks on both leg(s).  You may remove them at night for sleeping.             Medication List    STOP taking these medications        aspirin 81 MG tablet  Replaced by:  aspirin 325 MG EC tablet      TAKE these medications        ALIGN PO  Take 1 tablet by mouth every morning.     aspirin 325 MG EC tablet  Take 1 tablet (325 mg total) by mouth 2 (two) times daily.     celecoxib 200 MG capsule  Commonly known as:  CELEBREX  Take 1 capsule (200 mg total) by mouth every 12 (twelve) hours.     cholecalciferol 1000 units tablet  Commonly known as:  VITAMIN D  Take 1,000 Units by mouth daily.     Co Q 10 100 MG Caps  Take 1 capsule by mouth daily.     [employer] ANTIFUNGAL MAXIMUM STR 5-20 % Oint  Generic drug:  Undecylenic Ac-Zn Undecylenate  Apply 1 application topically daily.     DOCTORS CHOICE MEN Tabs  Take 3 tablets by mouth daily. "Doctor's choice for Diabetes"     docusate sodium 100 MG  capsule  Commonly known as:  COLACE  Take 1 capsule (100 mg total) by mouth 2 (two) times daily.     ferrous sulfate 325 (65 FE) MG tablet  Take 1 tablet (325 mg total) by mouth 3 (three) times daily after meals.     HYDROcodone-acetaminophen 7.5-325 MG tablet  Commonly known as:  NORCO  Take 1-2 tablets by mouth every 4 (four) hours as needed for moderate pain.     polyethylene glycol packet  Commonly known as:  MIRALAX /  GLYCOLAX  Take 17 g by mouth 2 (two) times daily.     simvastatin 40 MG tablet  Commonly known as:  ZOCOR  Take 20 mg by mouth every evening.     tiZANidine 4 MG tablet  Commonly known as:  ZANAFLEX  Take 1 tablet (4 mg total) by mouth every 6 (six) hours as needed for muscle spasms.     ZANTAC 300 MG tablet  Generic drug:  ranitidine  Take 1 tablet (300 mg total) by mouth at bedtime.         Signed: [place] Pugh. Jerick Khachatryan   PA-C  02/20/2016, 3:34 PM

## 2016-02-21 DIAGNOSIS — J45909 Unspecified asthma, uncomplicated: Secondary | ICD-10-CM | POA: Diagnosis not present

## 2016-02-21 DIAGNOSIS — M7062 Trochanteric bursitis, left hip: Secondary | ICD-10-CM | POA: Diagnosis not present

## 2016-02-21 DIAGNOSIS — I10 Essential (primary) hypertension: Secondary | ICD-10-CM | POA: Diagnosis not present

## 2016-02-21 DIAGNOSIS — M519 Unspecified thoracic, thoracolumbar and lumbosacral intervertebral disc disorder: Secondary | ICD-10-CM | POA: Diagnosis not present

## 2016-02-21 DIAGNOSIS — Z96652 Presence of left artificial knee joint: Secondary | ICD-10-CM | POA: Diagnosis not present

## 2016-02-21 DIAGNOSIS — Z471 Aftercare following joint replacement surgery: Secondary | ICD-10-CM | POA: Diagnosis not present

## 2016-02-23 DIAGNOSIS — M519 Unspecified thoracic, thoracolumbar and lumbosacral intervertebral disc disorder: Secondary | ICD-10-CM | POA: Diagnosis not present

## 2016-02-23 DIAGNOSIS — J45909 Unspecified asthma, uncomplicated: Secondary | ICD-10-CM | POA: Diagnosis not present

## 2016-02-23 DIAGNOSIS — I10 Essential (primary) hypertension: Secondary | ICD-10-CM | POA: Diagnosis not present

## 2016-02-23 DIAGNOSIS — Z96652 Presence of left artificial knee joint: Secondary | ICD-10-CM | POA: Diagnosis not present

## 2016-02-23 DIAGNOSIS — Z471 Aftercare following joint replacement surgery: Secondary | ICD-10-CM | POA: Diagnosis not present

## 2016-02-23 DIAGNOSIS — M7062 Trochanteric bursitis, left hip: Secondary | ICD-10-CM | POA: Diagnosis not present

## 2016-02-27 DIAGNOSIS — Z96652 Presence of left artificial knee joint: Secondary | ICD-10-CM | POA: Diagnosis not present

## 2016-02-27 DIAGNOSIS — Z471 Aftercare following joint replacement surgery: Secondary | ICD-10-CM | POA: Diagnosis not present

## 2016-03-08 DIAGNOSIS — M1712 Unilateral primary osteoarthritis, left knee: Secondary | ICD-10-CM | POA: Diagnosis not present

## 2016-03-11 DIAGNOSIS — M1712 Unilateral primary osteoarthritis, left knee: Secondary | ICD-10-CM | POA: Diagnosis not present

## 2016-03-15 DIAGNOSIS — M1712 Unilateral primary osteoarthritis, left knee: Secondary | ICD-10-CM | POA: Diagnosis not present

## 2016-03-18 DIAGNOSIS — M1712 Unilateral primary osteoarthritis, left knee: Secondary | ICD-10-CM | POA: Diagnosis not present

## 2016-03-22 DIAGNOSIS — M1712 Unilateral primary osteoarthritis, left knee: Secondary | ICD-10-CM | POA: Diagnosis not present

## 2016-03-27 DIAGNOSIS — Z471 Aftercare following joint replacement surgery: Secondary | ICD-10-CM | POA: Diagnosis not present

## 2016-03-27 DIAGNOSIS — Z96652 Presence of left artificial knee joint: Secondary | ICD-10-CM | POA: Diagnosis not present

## 2016-04-02 DIAGNOSIS — D235 Other benign neoplasm of skin of trunk: Secondary | ICD-10-CM | POA: Diagnosis not present

## 2016-04-02 DIAGNOSIS — L570 Actinic keratosis: Secondary | ICD-10-CM | POA: Diagnosis not present

## 2016-04-02 DIAGNOSIS — D485 Neoplasm of uncertain behavior of skin: Secondary | ICD-10-CM | POA: Diagnosis not present

## 2016-04-02 DIAGNOSIS — L821 Other seborrheic keratosis: Secondary | ICD-10-CM | POA: Diagnosis not present

## 2016-05-13 DIAGNOSIS — L905 Scar conditions and fibrosis of skin: Secondary | ICD-10-CM | POA: Diagnosis not present

## 2016-05-13 DIAGNOSIS — L570 Actinic keratosis: Secondary | ICD-10-CM | POA: Diagnosis not present

## 2016-06-04 DIAGNOSIS — R490 Dysphonia: Secondary | ICD-10-CM | POA: Diagnosis not present

## 2016-06-04 DIAGNOSIS — K219 Gastro-esophageal reflux disease without esophagitis: Secondary | ICD-10-CM | POA: Diagnosis not present

## 2016-06-04 DIAGNOSIS — J382 Nodules of vocal cords: Secondary | ICD-10-CM | POA: Diagnosis not present

## 2016-07-18 ENCOUNTER — Other Ambulatory Visit: Payer: Self-pay | Admitting: Otolaryngology

## 2016-07-18 ENCOUNTER — Encounter (HOSPITAL_COMMUNITY): Payer: Self-pay | Admitting: *Deleted

## 2016-07-19 ENCOUNTER — Ambulatory Visit (HOSPITAL_COMMUNITY): Payer: Medicare Other | Admitting: Certified Registered"

## 2016-07-19 ENCOUNTER — Encounter (HOSPITAL_COMMUNITY): Payer: Self-pay | Admitting: Urology

## 2016-07-19 ENCOUNTER — Encounter (HOSPITAL_COMMUNITY)
Admission: RE | Disposition: A | Discharge status: Home / Self Care | Payer: Self-pay | Source: Ambulatory Visit | Attending: Otolaryngology

## 2016-07-19 ENCOUNTER — Ambulatory Visit (HOSPITAL_COMMUNITY)
Admission: RE | Admit: 2016-07-19 | Discharge: 2016-07-19 | Disposition: A | Discharge status: Home / Self Care | Payer: Medicare Other | Source: Ambulatory Visit | Attending: Otolaryngology | Admitting: Otolaryngology

## 2016-07-19 DIAGNOSIS — E7800 Pure hypercholesterolemia, unspecified: Secondary | ICD-10-CM | POA: Insufficient documentation

## 2016-07-19 DIAGNOSIS — Z7982 Long term (current) use of aspirin: Secondary | ICD-10-CM | POA: Diagnosis not present

## 2016-07-19 DIAGNOSIS — Z8546 Personal history of malignant neoplasm of prostate: Secondary | ICD-10-CM | POA: Insufficient documentation

## 2016-07-19 DIAGNOSIS — Z79899 Other long term (current) drug therapy: Secondary | ICD-10-CM | POA: Insufficient documentation

## 2016-07-19 DIAGNOSIS — J383 Other diseases of vocal cords: Secondary | ICD-10-CM | POA: Diagnosis not present

## 2016-07-19 DIAGNOSIS — Z85828 Personal history of other malignant neoplasm of skin: Secondary | ICD-10-CM | POA: Diagnosis not present

## 2016-07-19 DIAGNOSIS — K219 Gastro-esophageal reflux disease without esophagitis: Secondary | ICD-10-CM | POA: Insufficient documentation

## 2016-07-19 DIAGNOSIS — J381 Polyp of vocal cord and larynx: Secondary | ICD-10-CM | POA: Diagnosis not present

## 2016-07-19 DIAGNOSIS — C320 Malignant neoplasm of glottis: Secondary | ICD-10-CM | POA: Diagnosis not present

## 2016-07-19 DIAGNOSIS — J382 Nodules of vocal cords: Secondary | ICD-10-CM | POA: Diagnosis present

## 2016-07-19 DIAGNOSIS — F17200 Nicotine dependence, unspecified, uncomplicated: Secondary | ICD-10-CM | POA: Diagnosis not present

## 2016-07-19 HISTORY — PX: MICROLARYNGOSCOPY W/VOCAL CORD INJECTION: SHX2665

## 2016-07-19 LAB — CBC
HEMATOCRIT: 41 % (ref 39.0–52.0)
Hemoglobin: 13.2 g/dL (ref 13.0–17.0)
MCH: 30.7 pg (ref 26.0–34.0)
MCHC: 32.2 g/dL (ref 30.0–36.0)
MCV: 95.3 fL (ref 78.0–100.0)
PLATELETS: 211 10*3/uL (ref 150–400)
RBC: 4.3 MIL/uL (ref 4.22–5.81)
RDW: 13.2 % (ref 11.5–15.5)
WBC: 4.6 10*3/uL (ref 4.0–10.5)

## 2016-07-19 SURGERY — MICROLARYNGOSCOPY, WITH VOCAL CORD INJECTION
Anesthesia: General | Site: Throat | Laterality: Right

## 2016-07-19 MED ORDER — FENTANYL CITRATE (PF) 100 MCG/2ML IJ SOLN
INTRAMUSCULAR | Status: DC | PRN
  Administered 2016-07-19: 25 ug via INTRAVENOUS
  Administered 2016-07-19: 50 ug via INTRAVENOUS
  Administered 2016-07-19: 25 ug via INTRAVENOUS

## 2016-07-19 MED ORDER — PROPOFOL 10 MG/ML IV BOLUS
INTRAVENOUS | Status: AC
  Filled 2016-07-19: qty 20

## 2016-07-19 MED ORDER — CHLORHEXIDINE GLUCONATE CLOTH 2 % EX PADS: 6.0000 | MEDICATED_PAD | Freq: Once | CUTANEOUS | Status: DC

## 2016-07-19 MED ORDER — ONDANSETRON HCL 4 MG/2ML IJ SOLN
INTRAMUSCULAR | Status: DC | PRN
  Administered 2016-07-19: 4 mg via INTRAVENOUS

## 2016-07-19 MED ORDER — LACTATED RINGERS IV SOLN
INTRAVENOUS | Status: DC
  Administered 2016-07-19: 11:00:00 via INTRAVENOUS

## 2016-07-19 MED ORDER — DEXAMETHASONE SODIUM PHOSPHATE 10 MG/ML IJ SOLN
INTRAMUSCULAR | Status: AC
  Filled 2016-07-19: qty 1

## 2016-07-19 MED ORDER — SUCCINYLCHOLINE CHLORIDE 20 MG/ML IJ SOLN
INTRAMUSCULAR | Status: DC | PRN
  Administered 2016-07-19: 140 mg via INTRAVENOUS

## 2016-07-19 MED ORDER — NEOSTIGMINE METHYLSULFATE 5 MG/5ML IV SOSY
PREFILLED_SYRINGE | INTRAVENOUS | Status: AC
  Filled 2016-07-19: qty 5

## 2016-07-19 MED ORDER — LIDOCAINE 2% (20 MG/ML) 5 ML SYRINGE
INTRAMUSCULAR | Status: AC
  Filled 2016-07-19: qty 20

## 2016-07-19 MED ORDER — ROCURONIUM BROMIDE 10 MG/ML (PF) SYRINGE
PREFILLED_SYRINGE | INTRAVENOUS | Status: DC | PRN
Start: 2016-07-19 — End: 2016-07-19
  Administered 2016-07-19: 30 mg via INTRAVENOUS

## 2016-07-19 MED ORDER — EPINEPHRINE HCL (NASAL) 0.1 % NA SOLN
NASAL | Status: AC
  Filled 2016-07-19: qty 30

## 2016-07-19 MED ORDER — SUGAMMADEX SODIUM 200 MG/2ML IV SOLN
INTRAVENOUS | Status: AC
  Filled 2016-07-19: qty 4

## 2016-07-19 MED ORDER — LIDOCAINE 2% (20 MG/ML) 5 ML SYRINGE
INTRAMUSCULAR | Status: DC | PRN
  Administered 2016-07-19: 40 mg via INTRAVENOUS

## 2016-07-19 MED ORDER — ONDANSETRON HCL 4 MG/2ML IJ SOLN: 4.0000 mg | Freq: Once | INTRAMUSCULAR | Status: DC | PRN

## 2016-07-19 MED ORDER — FENTANYL CITRATE (PF) 100 MCG/2ML IJ SOLN
INTRAMUSCULAR | Status: AC
  Filled 2016-07-19: qty 2

## 2016-07-19 MED ORDER — CEFAZOLIN SODIUM 1 G IJ SOLR
INTRAMUSCULAR | Status: AC
  Filled 2016-07-19: qty 30

## 2016-07-19 MED ORDER — ONDANSETRON HCL 4 MG/2ML IJ SOLN
INTRAMUSCULAR | Status: AC
  Filled 2016-07-19: qty 6

## 2016-07-19 MED ORDER — CEFAZOLIN SODIUM-DEXTROSE 2-4 GM/100ML-% IV SOLN
INTRAVENOUS | Status: AC
  Filled 2016-07-19: qty 100

## 2016-07-19 MED ORDER — EPINEPHRINE HCL 0.1 MG/ML IJ SOSY
PREFILLED_SYRINGE | INTRAMUSCULAR | Status: AC
  Filled 2016-07-19: qty 10

## 2016-07-19 MED ORDER — OXYCODONE HCL 5 MG PO TABS: 5.0000 mg | ORAL_TABLET | Freq: Once | ORAL | Status: DC | PRN

## 2016-07-19 MED ORDER — SUGAMMADEX SODIUM 200 MG/2ML IV SOLN
INTRAVENOUS | Status: DC | PRN
  Administered 2016-07-19: 200 mg via INTRAVENOUS

## 2016-07-19 MED ORDER — LIDOCAINE HCL 4 % MT SOLN
OROMUCOSAL | Status: DC | PRN
  Administered 2016-07-19: 2 mL via TOPICAL

## 2016-07-19 MED ORDER — FENTANYL CITRATE (PF) 100 MCG/2ML IJ SOLN: 25.0000 ug | INTRAMUSCULAR | Status: DC | PRN

## 2016-07-19 MED ORDER — SUCCINYLCHOLINE CHLORIDE 200 MG/10ML IV SOSY
PREFILLED_SYRINGE | INTRAVENOUS | Status: AC
  Filled 2016-07-19: qty 10

## 2016-07-19 MED ORDER — ESMOLOL HCL 100 MG/10ML IV SOLN
INTRAVENOUS | Status: DC | PRN
  Administered 2016-07-19: 30 mg via INTRAVENOUS
  Administered 2016-07-19: 50 mg via INTRAVENOUS
  Administered 2016-07-19: 20 mg via INTRAVENOUS

## 2016-07-19 MED ORDER — EPINEPHRINE HCL (NASAL) 0.1 % NA SOLN
NASAL | Status: DC | PRN
  Administered 2016-07-19: 1 [drp]

## 2016-07-19 MED ORDER — CEFAZOLIN SODIUM-DEXTROSE 2-4 GM/100ML-% IV SOLN
2.0000 g | INTRAVENOUS | Status: AC
  Administered 2016-07-19: 2 g via INTRAVENOUS
  Filled 2016-07-19: qty 100

## 2016-07-19 MED ORDER — DEXAMETHASONE SODIUM PHOSPHATE 10 MG/ML IJ SOLN
10.0000 mg | Freq: Once | INTRAMUSCULAR | Status: AC
  Administered 2016-07-19: 10 mg via INTRAVENOUS

## 2016-07-19 MED ORDER — KETOROLAC TROMETHAMINE 30 MG/ML IJ SOLN
INTRAMUSCULAR | Status: AC
  Filled 2016-07-19: qty 1

## 2016-07-19 MED ORDER — GLYCOPYRROLATE 0.2 MG/ML IV SOSY
PREFILLED_SYRINGE | INTRAVENOUS | Status: AC
  Filled 2016-07-19: qty 3

## 2016-07-19 MED ORDER — 0.9 % SODIUM CHLORIDE (POUR BTL) OPTIME
TOPICAL | Status: DC | PRN
  Administered 2016-07-19: 1000 mL

## 2016-07-19 MED ORDER — PROPOFOL 10 MG/ML IV BOLUS
INTRAVENOUS | Status: DC | PRN
  Administered 2016-07-19: 50 mg via INTRAVENOUS
  Administered 2016-07-19: 150 mg via INTRAVENOUS

## 2016-07-19 MED ORDER — ROCURONIUM BROMIDE 10 MG/ML (PF) SYRINGE
PREFILLED_SYRINGE | INTRAVENOUS | Status: AC
  Filled 2016-07-19: qty 20

## 2016-07-19 MED ORDER — OXYCODONE HCL 5 MG/5ML PO SOLN: 5.0000 mg | Freq: Once | ORAL | Status: DC | PRN

## 2016-07-19 SURGICAL SUPPLY — 27 items
BLADE SURG 15 STRL LF DISP TIS (BLADE) IMPLANT
BLADE SURG 15 STRL SS (BLADE)
CANISTER SUCTION 2500CC (MISCELLANEOUS) ×2 IMPLANT
CONT SPEC 4OZ CLIKSEAL STRL BL (MISCELLANEOUS) IMPLANT
COVER MAYO STAND STRL (DRAPES) ×2 IMPLANT
COVER TABLE BACK 60X90 (DRAPES) ×2 IMPLANT
DRAPE PROXIMA HALF (DRAPES) ×2 IMPLANT
DRESSING TELFA 8X3 (GAUZE/BANDAGES/DRESSINGS) ×2 IMPLANT
DRSG TELFA 3X8 NADH (GAUZE/BANDAGES/DRESSINGS) ×2 IMPLANT
GAUZE SPONGE 4X4 12PLY STRL (GAUZE/BANDAGES/DRESSINGS) ×2 IMPLANT
GAUZE SPONGE 4X4 16PLY XRAY LF (GAUZE/BANDAGES/DRESSINGS) IMPLANT
GLOVE BIOGEL M 7.0 STRL (GLOVE) ×2 IMPLANT
GUARD TEETH (MISCELLANEOUS) IMPLANT
KIT BASIN OR (CUSTOM PROCEDURE TRAY) ×2 IMPLANT
KIT ROOM TURNOVER OR (KITS) ×2 IMPLANT
MARKER SKIN DUAL TIP RULER LAB (MISCELLANEOUS) IMPLANT
NEEDLE 18GX1X1/2 (RX/OR ONLY) (NEEDLE) ×2 IMPLANT
NEEDLE 22X1 1/2 (OR ONLY) (NEEDLE) IMPLANT
NEEDLE HYPO 25GX1X1/2 BEV (NEEDLE) ×2 IMPLANT
NEEDLE TRANS ORAL INJECTION (NEEDLE) IMPLANT
NS IRRIG 1000ML POUR BTL (IV SOLUTION) ×2 IMPLANT
PAD ARMBOARD 7.5X6 YLW CONV (MISCELLANEOUS) ×4 IMPLANT
PATTIES SURGICAL .5 X1 (DISPOSABLE) IMPLANT
SOLUTION ANTI FOG 6CC (MISCELLANEOUS) ×2 IMPLANT
TOWEL OR 17X24 6PK STRL BLUE (TOWEL DISPOSABLE) ×2 IMPLANT
TUBE CONNECTING 12X1/4 (SUCTIONS) ×2 IMPLANT
TUBING STRAIGHTSHOT EPS 5PK (TUBING) ×2 IMPLANT

## 2016-07-19 NOTE — Anesthesia Preprocedure Evaluation (Signed)
Anesthesia Evaluation  Patient identified by MRN, date of birth, ID band Patient awake    Reviewed: Allergy & Precautions, NPO status , Patient's Chart, lab work & pertinent test results  Airway Mallampati: II  TM Distance: >3 FB     Dental  (+) Teeth Intact, Dental Advisory Given   Pulmonary Current Smoker,    breath sounds clear to auscultation       Cardiovascular  Rhythm:Regular Rate:Normal     Neuro/Psych    GI/Hepatic   Endo/Other    Renal/GU      Musculoskeletal   Abdominal   Peds  Hematology   Anesthesia Other Findings   Reproductive/Obstetrics                             Anesthesia Physical Anesthesia Plan  ASA: III  Anesthesia Plan: General   Post-op Pain Management:    Induction: Intravenous  Airway Management Planned:   Additional Equipment:   Intra-op Plan:   Post-operative Plan: Extubation in OR  Informed Consent: I have reviewed the patients History and Physical, chart, labs and discussed the procedure including the risks, benefits and alternatives for the proposed anesthesia with the patient or authorized representative who has indicated his/her understanding and acceptance.   Dental advisory given  Plan Discussed with: CRNA and Anesthesiologist  Anesthesia Plan Comments:         Anesthesia Quick Evaluation

## 2016-07-19 NOTE — Anesthesia Postprocedure Evaluation (Signed)
Anesthesia Post Note  Patient: Darren Edwards  Procedure(s) Performed: Procedure(s) (LRB): MICROLARYNGOSCOPY WITH EXCISION OF RIGHT  VOCAL CORD POLYP (Right)  Patient location during evaluation: PACU Anesthesia Type: General Level of consciousness: awake, awake and alert and oriented Pain management: pain level controlled Vital Signs Assessment: post-procedure vital signs reviewed and stable Respiratory status: spontaneous breathing, nonlabored ventilation and respiratory function stable Cardiovascular status: blood pressure returned to baseline Anesthetic complications: no    Last Vitals:  Vitals:   07/19/16 1353 07/19/16 1400  BP: (!) 143/74   Pulse: 66 66  Resp: 13 14  Temp:  36.2 C    Last Pain:  Vitals:   07/19/16 1032  TempSrc: Oral                 Farhan Jean COKER

## 2016-07-19 NOTE — Transfer of Care (Signed)
Immediate Anesthesia Transfer of Care Note  Patient: Darren Edwards  Procedure(s) Performed: Procedure(s): MICROLARYNGOSCOPY WITH EXCISION OF RIGHT  VOCAL CORD POLYP (Right)  Patient Location: PACU  Anesthesia Type:General  Level of Consciousness: awake, alert , oriented and patient cooperative  Airway & Oxygen Therapy: Patient Spontanous Breathing and Patient connected to face mask oxygen  Post-op Assessment: Report given to RN and Post -op Vital signs reviewed and stable  Post vital signs: Reviewed and stable  Last Vitals:  Vitals:   07/19/16 1032 07/19/16 1321  BP: (!) 172/72   Pulse: 78   Resp: 20   Temp: 36.7 C 36.4 C    Last Pain:  Vitals:   07/19/16 1032  TempSrc: Oral         Complications: No apparent anesthesia complications

## 2016-07-19 NOTE — Anesthesia Procedure Notes (Addendum)
Procedure Name: Intubation Date/Time: 07/19/2016 12:42 PM Performed by: Everlean Cherry A Pre-anesthesia Checklist: Patient identified, Emergency Drugs available, Suction available and Patient being monitored Patient Re-evaluated:Patient Re-evaluated prior to inductionOxygen Delivery Method: Circle System Utilized Preoxygenation: Pre-oxygenation with 100% oxygen Intubation Type: IV induction Ventilation: Mask ventilation without difficulty Laryngoscope Size: Miller and 2 Grade View: Grade II Tube type: Oral Tube size: 6.0 mm Number of attempts: 1 Airway Equipment and Method: Stylet Placement Confirmation: ETT inserted through vocal cords under direct vision,  positive ETCO2 and breath sounds checked- equal and bilateral Secured at: 22 cm Tube secured with: Tape Dental Injury: Teeth and Oropharynx as per pre-operative assessment

## 2016-07-19 NOTE — Brief Op Note (Signed)
07/19/2016  1:13 PM  PATIENT:  Darren Edwards  82 y.o. male  PRE-OPERATIVE DIAGNOSIS:  RIGHT VOCAL CORD POLYP  POST-OPERATIVE DIAGNOSIS:  RIGHT VOCAL CORD POLYP  PROCEDURE:  Procedure(s): MICROLARYNGOSCOPY WITH EXCISION OF RIGHT  VOCAL CORD POLYP (Right)  SURGEON:  Surgeon(s) and Role:    * [name], MD - Primary  PHYSICIAN ASSISTANT:   ASSISTANTS: none   ANESTHESIA:   general  EBL:  No intake/output data recorded.  BLOOD ADMINISTERED:none  DRAINS: none   LOCAL MEDICATIONS USED:  NONE  SPECIMEN:  Source of Specimen:  Right vocal cord lesion  DISPOSITION OF SPECIMEN:  PATHOLOGY  COUNTS:  YES  TOURNIQUET:  * No tourniquets in log *  DICTATION: .Other Dictation: Dictation Number [id number]  PLAN OF CARE: Discharge to home after PACU  PATIENT DISPOSITION:  PACU - hemodynamically stable.   Delay start of Pharmacological VTE agent (>24hrs) due to surgical blood loss or risk of bleeding: not applicable

## 2016-07-19 NOTE — H&P (Signed)
Darren Edwards is an 82 y.o. male.   Chief Complaint: Hoarseness HPI: Chronic hoarseness and Right VC nodule  Past Medical History:  Diagnosis Date  . Arthritis    Knee  . Cancer ([place])    HX SKIN CANCERS / HX PROSTATE CANCER  . GERD (gastroesophageal reflux disease)   . Hypercholesteremia     Past Surgical History:  Procedure Laterality Date  . COLONOSCOPY  2011   w/Dr. Deatra Ina  . PARTIAL KNEE ARTHROPLASTY Left 02/12/2016   Procedure: UNICOMPARTMENTAL LEFT KNEE MEDIALLY;  Surgeon: Paralee Cancel, MD;  Location: [facility];  Service: Orthopedics;  Laterality: Left;  . PROSTATECTOMY    . SKIN CANCER EXCISION    . TONSILLECTOMY      Family History  Problem Relation Age of Onset  . Colon cancer Neg Hx   . Colon polyps Neg Hx   . Diabetes Neg Hx   . Esophageal cancer Neg Hx    Social History:  reports that he has been smoking Cigars.  He has never used smokeless tobacco. He reports that he drinks about 4.2 oz of alcohol per week . He reports that he does not use drugs.  Allergies:  Allergies  Allergen Reactions  . Penicillins Rash    Has patient had a PCN reaction causing immediate rash, facial/tongue/throat swelling, SOB or lightheadedness with hypotension: No Has patient had a PCN reaction causing severe rash involving mucus membranes or skin necrosis: No Has patient had a PCN reaction that required hospitalization No Has patient had a PCN reaction occurring within the last 10 years: No If all of the above answers are "NO", then may proceed with Cephalosporin use.    Medications Prior to Admission  Medication Sig Dispense Refill  . aspirin 81 MG chewable tablet Chew 81 mg by mouth daily.    . cholecalciferol (VITAMIN D) 1000 units tablet Take 1,000 Units by mouth daily.    . clindamycin (CLEOCIN) 300 MG capsule Take 300 mg by mouth once as needed. Prior to dental appt    . Coenzyme Q10 (CO Q 10) 100 MG CAPS Take 1 capsule by mouth daily. 30 capsule 0  . Probiotic Product  (ALIGN PO) Take 1 tablet by mouth every morning.    . ranitidine (ZANTAC) 300 MG tablet Take 1 tablet (300 mg total) by mouth at bedtime. 30 tablet 0  . simvastatin (ZOCOR) 40 MG tablet Take 20 mg by mouth every evening.    . Undecylenic Ac-Zn Undecylenate ([employer] ANTIFUNGAL MAXIMUM STR) 5-20 % OINT Apply 1 application topically daily.    . celecoxib (CELEBREX) 200 MG capsule Take 1 capsule (200 mg total) by mouth every 12 (twelve) hours. (Patient not taking: Reported on 07/18/2016) 60 capsule 0  . docusate sodium (COLACE) 100 MG capsule Take 1 capsule (100 mg total) by mouth 2 (two) times daily. (Patient not taking: Reported on 07/18/2016) 10 capsule 0  . ferrous sulfate 325 (65 FE) MG tablet Take 1 tablet (325 mg total) by mouth 3 (three) times daily after meals. (Patient not taking: Reported on 07/18/2016)  3  . HYDROcodone-acetaminophen (NORCO) 7.5-325 MG tablet Take 1-2 tablets by mouth every 4 (four) hours as needed for moderate pain. (Patient not taking: Reported on 07/18/2016) 100 tablet 0  . polyethylene glycol (MIRALAX / GLYCOLAX) packet Take 17 g by mouth 2 (two) times daily. (Patient not taking: Reported on 07/18/2016) 14 each 0  . tiZANidine (ZANAFLEX) 4 MG tablet Take 1 tablet (4 mg total) by mouth every  6 (six) hours as needed for muscle spasms. (Patient not taking: Reported on 07/18/2016) 40 tablet 0    No results found for this or any previous visit (from the past 48 hour(s)). No results found.  Review of Systems  Constitutional: Negative.   HENT: Negative.   Respiratory: Negative.   Cardiovascular: Negative.     Blood pressure (!) 172/72, pulse 78, temperature 98.1 F (36.7 C), temperature source Oral, resp. rate 20, SpO2 100 %. Physical Exam  Constitutional: He appears well-developed and well-nourished.  HENT:  Right VC nodule  Neck: Normal range of motion. Neck supple.  Cardiovascular: Normal rate.   Respiratory: Effort normal.     Assessment/Plan Adm for OP  microlaryngoscopy and excision of Right VC nodule  Lanore Renderos, MD 07/19/2016, 11:13 AM

## 2016-07-20 NOTE — Op Note (Signed)
NAME:  Darren Edwards, HOLDERBY NO.:  [id number]  MEDICAL RECORD NO.:  [id number]  LOCATION:  [facility]                         FACILITY:  [facility]  PHYSICIAN:  Early Chars. Wilburn Cornelia, M.D.DATE OF BIRTH:  Aug 16, 1929  DATE OF PROCEDURE:  07/19/2016 DATE OF DISCHARGE:                              OPERATIVE REPORT   PREOPERATIVE DIAGNOSES: 1. Chronic hoarseness. 2. Right vocal cord nodule with ulceration.  POSTOPERATIVE DIAGNOSES: 1. Chronic hoarseness. 2. Right vocal cord nodule with ulceration.  INDICATION FOR SURGERY: 1. Chronic hoarseness. 2. Right vocal cord nodule with ulceration.  SURGICAL PROCEDURE:  Microlaryngoscopy with right vocal cord stripping and removal of vocal cord nodule.  ANESTHESIA:  General endotracheal.  SURGEON:  Early Chars. Wilburn Cornelia, M.D.  COMPLICATIONS:  None.  BLOOD LOSS:  Minimal.  The patient transferred from the operating room to the recovery room in stable condition.  BRIEF HISTORY:  The patient is an 82 year old white male who was referred to our office and has been followed over the last 4 years with history of chronic hoarseness.  Serial laryngoscopy has been performed. The patient has a history of gastroesophageal reflux and reflux-related changes, which are stable.  The patient continues to have chronic raspy voice and poor vocal quality.  He was seen 2 months ago and found to have a small nodular mass involving the midaspect of the right vocal cord, this was monitored and on followup 2 months later, continued to have similar complaints and findings with some area of inflammatory change on the right-hand side.  Given his history and findings, I recommended that we undertake microlaryngoscopy and surgical excision of the vocal cord nodule.  The risks and benefits were discussed in detail with the patient and his family, and they understood and agreed with our plan for surgery, which was scheduled on elective basis at [facility].  DESCRIPTION OF PROCEDURE:  The patient was brought to the operating room on July 19, 2016, and placed in supine position on the operating table, general endotracheal anesthesia was established without difficulty.  The patient was positioned and prepped and draped.  A surgical time-out was then performed to correct identification of the patient and the surgical procedure including the right laterality of the vocal cord pathology.  With the patient prepped, draped and prepared, a Dedo laryngoscope was inserted without difficulty after a tooth guard was placed over the upper teeth.  There was no evidence of mucosal lesion or mass within the oral cavity, oropharynx or hypopharynx.  The laryngoscope was inserted for adequate visualization of the entire larynx and the microlaryngoscopy suspension was then placed.  The operating microscope was moved into position, the vocal cords were examined.  The patient had a nodular mass with overlying exudate in the midaspect of the right vocal cord.  This was carefully biopsied and underlying this was an area of erythema on the vocal cord mucosa proper.  Using microlaryngoscopy instruments including scissors, vocal cord stripping was performed. Incisions were created in the anterior mucosa along the superior surface of the right vocal cord and posterior to the lesion and then using cup forceps, the entire area of concern was stripped, preserving the underlying  vocal cord tendon and removing the mucosa and the overlying erythematous mass.  This was sent in its entirety to Pathology for gross microscopic evaluation.  There was minimal bleeding and a small adrenaline-soaked cottonoid pledget was placed overlying the biopsy site.  After allowing adequate time for vasoconstriction, laryngotracheal anesthesia was administered with 2 mL of 2% topical lidocaine.  The patient's airway was gently suctioned.  The laryngoscope was removed.  There  was no bleeding, no loose or broken teeth, no trauma to the oral mucosa, tongue or lips.  The patient was then awakened from his anesthetic.  He was extubated and was transferred from the operating room to the recovery room in stable condition.  There were no complications and blood loss was minimal.          ______________________________ Early Chars. Wilburn Cornelia, M.D.     DLS/MEDQ  D:  [id number]  T:  07/20/2016  Job:  [id number]

## 2016-07-22 ENCOUNTER — Encounter (HOSPITAL_COMMUNITY): Payer: Self-pay | Admitting: Otolaryngology

## 2016-07-22 ENCOUNTER — Ambulatory Visit: Admit: 2016-07-22 | Payer: PRIVATE HEALTH INSURANCE | Admitting: Otolaryngology

## 2016-07-22 SURGERY — MICROLARYNGOSCOPY WITH CO2 LASER AND EXCISION OF VOCAL CORD LESION
Anesthesia: General

## 2016-07-23 DIAGNOSIS — Z23 Encounter for immunization: Secondary | ICD-10-CM | POA: Diagnosis not present

## 2016-07-29 ENCOUNTER — Encounter: Payer: Self-pay | Admitting: Pulmonary Disease

## 2016-08-19 ENCOUNTER — Encounter: Payer: Self-pay | Admitting: Pulmonary Disease

## 2016-08-19 NOTE — Telephone Encounter (Signed)
SN, pt states he was pierced by a dog that is too young for rabies injections; would like to know if he needs tetanus shot or what to do about it. Please advise. Thanks.

## 2016-10-07 ENCOUNTER — Other Ambulatory Visit: Payer: Self-pay | Admitting: Dermatology

## 2016-10-07 DIAGNOSIS — L570 Actinic keratosis: Secondary | ICD-10-CM | POA: Diagnosis not present

## 2016-10-07 DIAGNOSIS — L821 Other seborrheic keratosis: Secondary | ICD-10-CM | POA: Diagnosis not present

## 2016-10-07 DIAGNOSIS — D225 Melanocytic nevi of trunk: Secondary | ICD-10-CM | POA: Diagnosis not present

## 2016-10-07 DIAGNOSIS — C44529 Squamous cell carcinoma of skin of other part of trunk: Secondary | ICD-10-CM | POA: Diagnosis not present

## 2016-11-07 DIAGNOSIS — L570 Actinic keratosis: Secondary | ICD-10-CM | POA: Diagnosis not present

## 2016-11-07 DIAGNOSIS — Z85828 Personal history of other malignant neoplasm of skin: Secondary | ICD-10-CM | POA: Diagnosis not present

## 2016-11-07 DIAGNOSIS — S81811A Laceration without foreign body, right lower leg, initial encounter: Secondary | ICD-10-CM | POA: Diagnosis not present

## 2016-11-07 DIAGNOSIS — L905 Scar conditions and fibrosis of skin: Secondary | ICD-10-CM | POA: Diagnosis not present

## 2016-12-13 DIAGNOSIS — L821 Other seborrheic keratosis: Secondary | ICD-10-CM | POA: Diagnosis not present

## 2016-12-13 DIAGNOSIS — C44629 Squamous cell carcinoma of skin of left upper limb, including shoulder: Secondary | ICD-10-CM | POA: Diagnosis not present

## 2016-12-13 DIAGNOSIS — L578 Other skin changes due to chronic exposure to nonionizing radiation: Secondary | ICD-10-CM | POA: Diagnosis not present

## 2017-01-28 DIAGNOSIS — L905 Scar conditions and fibrosis of skin: Secondary | ICD-10-CM | POA: Diagnosis not present

## 2017-01-28 DIAGNOSIS — Z85828 Personal history of other malignant neoplasm of skin: Secondary | ICD-10-CM | POA: Diagnosis not present

## 2017-01-28 DIAGNOSIS — L821 Other seborrheic keratosis: Secondary | ICD-10-CM | POA: Diagnosis not present

## 2017-01-28 DIAGNOSIS — D485 Neoplasm of uncertain behavior of skin: Secondary | ICD-10-CM | POA: Diagnosis not present

## 2017-01-28 DIAGNOSIS — L570 Actinic keratosis: Secondary | ICD-10-CM | POA: Diagnosis not present

## 2017-01-28 DIAGNOSIS — L820 Inflamed seborrheic keratosis: Secondary | ICD-10-CM | POA: Diagnosis not present

## 2017-02-26 DIAGNOSIS — Z471 Aftercare following joint replacement surgery: Secondary | ICD-10-CM | POA: Diagnosis not present

## 2017-02-26 DIAGNOSIS — Z96652 Presence of left artificial knee joint: Secondary | ICD-10-CM | POA: Diagnosis not present

## 2017-04-03 DIAGNOSIS — Z8521 Personal history of malignant neoplasm of larynx: Secondary | ICD-10-CM | POA: Diagnosis not present

## 2017-04-03 DIAGNOSIS — R490 Dysphonia: Secondary | ICD-10-CM | POA: Diagnosis not present

## 2017-04-03 DIAGNOSIS — J383 Other diseases of vocal cords: Secondary | ICD-10-CM | POA: Diagnosis not present

## 2017-04-03 DIAGNOSIS — Z7289 Other problems related to lifestyle: Secondary | ICD-10-CM | POA: Diagnosis not present

## 2017-06-18 ENCOUNTER — Other Ambulatory Visit: Payer: Self-pay | Admitting: Dermatology

## 2017-06-18 DIAGNOSIS — L820 Inflamed seborrheic keratosis: Secondary | ICD-10-CM | POA: Diagnosis not present

## 2017-06-18 DIAGNOSIS — C44619 Basal cell carcinoma of skin of left upper limb, including shoulder: Secondary | ICD-10-CM | POA: Diagnosis not present

## 2017-06-18 DIAGNOSIS — L570 Actinic keratosis: Secondary | ICD-10-CM | POA: Diagnosis not present

## 2017-06-18 DIAGNOSIS — L821 Other seborrheic keratosis: Secondary | ICD-10-CM | POA: Diagnosis not present

## 2017-08-04 DIAGNOSIS — K219 Gastro-esophageal reflux disease without esophagitis: Secondary | ICD-10-CM | POA: Diagnosis not present

## 2017-08-04 DIAGNOSIS — C320 Malignant neoplasm of glottis: Secondary | ICD-10-CM | POA: Diagnosis not present

## 2017-08-04 DIAGNOSIS — R490 Dysphonia: Secondary | ICD-10-CM | POA: Diagnosis not present

## 2017-08-19 DIAGNOSIS — L570 Actinic keratosis: Secondary | ICD-10-CM | POA: Diagnosis not present

## 2017-08-19 DIAGNOSIS — Z85828 Personal history of other malignant neoplasm of skin: Secondary | ICD-10-CM | POA: Diagnosis not present

## 2017-08-19 DIAGNOSIS — L905 Scar conditions and fibrosis of skin: Secondary | ICD-10-CM | POA: Diagnosis not present

## 2017-08-21 DIAGNOSIS — Z23 Encounter for immunization: Secondary | ICD-10-CM | POA: Diagnosis not present

## 2017-09-01 DIAGNOSIS — S81812A Laceration without foreign body, left lower leg, initial encounter: Secondary | ICD-10-CM | POA: Diagnosis not present

## 2017-09-04 ENCOUNTER — Encounter: Payer: Self-pay | Admitting: Pulmonary Disease

## 2017-09-04 NOTE — Telephone Encounter (Signed)
Dr. Lenna Gilford, please advise on the following email. Thanks!   ----- Message -----  From: Darren Edwards  Sent: 09/04/2017 11:59 AM EST  To: Darren Space, MD Subject: Non-Urgent Medical Question  Hi,  [place] and had a substantial wound to my leg, I had long pants on, the pants were not torn but an area 3 inches by 3 inches had the skin ripped off and a deep open sore resulted, he was pecking at me, but I believe the wound may have been caused by a wing, swans have very sharp wings as well as a 6 foot wing span.  That evening I went to a walk-in clinic, the doctor did his best to pull the skin over the wound and most of it is covered and held in place by steri-strips.I did have a DTAP immunization December 2010, it seems to be fine, no red streaks or anything, I was just wondering if I need to take any other action, or just monitor it.  Thanks

## 2017-09-24 DIAGNOSIS — H2513 Age-related nuclear cataract, bilateral: Secondary | ICD-10-CM | POA: Diagnosis not present

## 2017-11-12 ENCOUNTER — Other Ambulatory Visit: Payer: Self-pay | Admitting: Dermatology

## 2017-11-12 DIAGNOSIS — C44722 Squamous cell carcinoma of skin of right lower limb, including hip: Secondary | ICD-10-CM | POA: Diagnosis not present

## 2017-11-12 DIAGNOSIS — C44729 Squamous cell carcinoma of skin of left lower limb, including hip: Secondary | ICD-10-CM | POA: Diagnosis not present

## 2017-12-10 DIAGNOSIS — R490 Dysphonia: Secondary | ICD-10-CM | POA: Diagnosis not present

## 2017-12-10 DIAGNOSIS — Z8521 Personal history of malignant neoplasm of larynx: Secondary | ICD-10-CM | POA: Diagnosis not present

## 2018-01-15 DIAGNOSIS — L821 Other seborrheic keratosis: Secondary | ICD-10-CM | POA: Diagnosis not present

## 2018-01-15 DIAGNOSIS — Z85828 Personal history of other malignant neoplasm of skin: Secondary | ICD-10-CM | POA: Diagnosis not present

## 2018-01-15 DIAGNOSIS — L814 Other melanin hyperpigmentation: Secondary | ICD-10-CM | POA: Diagnosis not present

## 2018-01-15 DIAGNOSIS — L570 Actinic keratosis: Secondary | ICD-10-CM | POA: Diagnosis not present

## 2018-01-15 DIAGNOSIS — D229 Melanocytic nevi, unspecified: Secondary | ICD-10-CM | POA: Diagnosis not present

## 2018-04-16 DIAGNOSIS — D485 Neoplasm of uncertain behavior of skin: Secondary | ICD-10-CM | POA: Diagnosis not present

## 2018-04-16 DIAGNOSIS — Z85828 Personal history of other malignant neoplasm of skin: Secondary | ICD-10-CM | POA: Diagnosis not present

## 2018-04-16 DIAGNOSIS — L821 Other seborrheic keratosis: Secondary | ICD-10-CM | POA: Diagnosis not present

## 2018-04-16 DIAGNOSIS — L570 Actinic keratosis: Secondary | ICD-10-CM | POA: Diagnosis not present

## 2018-05-21 DIAGNOSIS — C44729 Squamous cell carcinoma of skin of left lower limb, including hip: Secondary | ICD-10-CM | POA: Diagnosis not present

## 2018-05-21 DIAGNOSIS — D0472 Carcinoma in situ of skin of left lower limb, including hip: Secondary | ICD-10-CM | POA: Diagnosis not present

## 2018-06-11 DIAGNOSIS — C44622 Squamous cell carcinoma of skin of right upper limb, including shoulder: Secondary | ICD-10-CM | POA: Diagnosis not present

## 2018-06-11 DIAGNOSIS — L570 Actinic keratosis: Secondary | ICD-10-CM | POA: Diagnosis not present

## 2018-06-15 DIAGNOSIS — Z7289 Other problems related to lifestyle: Secondary | ICD-10-CM | POA: Diagnosis not present

## 2018-06-15 DIAGNOSIS — Z8521 Personal history of malignant neoplasm of larynx: Secondary | ICD-10-CM | POA: Diagnosis not present

## 2018-06-15 DIAGNOSIS — K219 Gastro-esophageal reflux disease without esophagitis: Secondary | ICD-10-CM | POA: Diagnosis not present

## 2018-07-08 DIAGNOSIS — L570 Actinic keratosis: Secondary | ICD-10-CM | POA: Diagnosis not present

## 2018-07-08 DIAGNOSIS — L280 Lichen simplex chronicus: Secondary | ICD-10-CM | POA: Diagnosis not present

## 2018-07-08 DIAGNOSIS — L821 Other seborrheic keratosis: Secondary | ICD-10-CM | POA: Diagnosis not present

## 2018-07-08 DIAGNOSIS — Z85820 Personal history of malignant melanoma of skin: Secondary | ICD-10-CM | POA: Diagnosis not present

## 2018-07-08 DIAGNOSIS — C44729 Squamous cell carcinoma of skin of left lower limb, including hip: Secondary | ICD-10-CM | POA: Diagnosis not present

## 2018-07-08 DIAGNOSIS — D225 Melanocytic nevi of trunk: Secondary | ICD-10-CM | POA: Diagnosis not present

## 2018-08-17 DIAGNOSIS — Z23 Encounter for immunization: Secondary | ICD-10-CM | POA: Diagnosis not present

## 2018-09-30 DIAGNOSIS — H43811 Vitreous degeneration, right eye: Secondary | ICD-10-CM | POA: Diagnosis not present

## 2018-09-30 DIAGNOSIS — H02831 Dermatochalasis of right upper eyelid: Secondary | ICD-10-CM | POA: Diagnosis not present

## 2018-09-30 DIAGNOSIS — H02834 Dermatochalasis of left upper eyelid: Secondary | ICD-10-CM | POA: Diagnosis not present

## 2018-09-30 DIAGNOSIS — H2513 Age-related nuclear cataract, bilateral: Secondary | ICD-10-CM | POA: Diagnosis not present

## 2018-10-12 ENCOUNTER — Ambulatory Visit: Payer: PRIVATE HEALTH INSURANCE | Admitting: Internal Medicine

## 2018-10-13 DIAGNOSIS — Z85828 Personal history of other malignant neoplasm of skin: Secondary | ICD-10-CM | POA: Diagnosis not present

## 2018-10-13 DIAGNOSIS — C44729 Squamous cell carcinoma of skin of left lower limb, including hip: Secondary | ICD-10-CM | POA: Diagnosis not present

## 2018-10-13 DIAGNOSIS — L570 Actinic keratosis: Secondary | ICD-10-CM | POA: Diagnosis not present

## 2018-10-16 ENCOUNTER — Ambulatory Visit (INDEPENDENT_AMBULATORY_CARE_PROVIDER_SITE_OTHER): Payer: Medicare Other | Admitting: Internal Medicine

## 2018-10-16 ENCOUNTER — Other Ambulatory Visit (INDEPENDENT_AMBULATORY_CARE_PROVIDER_SITE_OTHER): Payer: Medicare Other

## 2018-10-16 ENCOUNTER — Encounter: Payer: Self-pay | Admitting: Internal Medicine

## 2018-10-16 VITALS — BP 128/70 | HR 80 | Temp 98.4°F | Ht 69.0 in | Wt 136.0 lb

## 2018-10-16 DIAGNOSIS — Z23 Encounter for immunization: Secondary | ICD-10-CM | POA: Diagnosis not present

## 2018-10-16 DIAGNOSIS — R490 Dysphonia: Secondary | ICD-10-CM | POA: Insufficient documentation

## 2018-10-16 DIAGNOSIS — C4492 Squamous cell carcinoma of skin, unspecified: Secondary | ICD-10-CM

## 2018-10-16 DIAGNOSIS — I10 Essential (primary) hypertension: Secondary | ICD-10-CM

## 2018-10-16 DIAGNOSIS — K219 Gastro-esophageal reflux disease without esophagitis: Secondary | ICD-10-CM | POA: Diagnosis not present

## 2018-10-16 DIAGNOSIS — C320 Malignant neoplasm of glottis: Secondary | ICD-10-CM

## 2018-10-16 DIAGNOSIS — R739 Hyperglycemia, unspecified: Secondary | ICD-10-CM

## 2018-10-16 HISTORY — DX: Gastro-esophageal reflux disease without esophagitis: K21.9

## 2018-10-16 HISTORY — DX: Squamous cell carcinoma of skin, unspecified: C44.92

## 2018-10-16 HISTORY — DX: Malignant neoplasm of glottis: C32.0

## 2018-10-16 LAB — URINALYSIS, ROUTINE W REFLEX MICROSCOPIC
Bilirubin Urine: NEGATIVE
HGB URINE DIPSTICK: NEGATIVE
Ketones, ur: NEGATIVE
Leukocytes, UA: NEGATIVE
NITRITE: NEGATIVE
RBC / HPF: NONE SEEN (ref 0–?)
SPECIFIC GRAVITY, URINE: 1.015 (ref 1.000–1.030)
Total Protein, Urine: NEGATIVE
URINE GLUCOSE: NEGATIVE
Urobilinogen, UA: 0.2 (ref 0.0–1.0)
WBC, UA: NONE SEEN (ref 0–?)
pH: 7 (ref 5.0–8.0)

## 2018-10-16 LAB — HEPATIC FUNCTION PANEL
ALBUMIN: 4.1 g/dL (ref 3.5–5.2)
ALK PHOS: 53 U/L (ref 39–117)
ALT: 12 U/L (ref 0–53)
AST: 16 U/L (ref 0–37)
Bilirubin, Direct: 0 mg/dL (ref 0.0–0.3)
TOTAL PROTEIN: 7 g/dL (ref 6.0–8.3)
Total Bilirubin: 0.4 mg/dL (ref 0.2–1.2)

## 2018-10-16 LAB — LIPID PANEL
CHOL/HDL RATIO: 3
CHOLESTEROL: 168 mg/dL (ref 0–200)
HDL: 52.9 mg/dL (ref >39.00)
LDL Cholesterol: 98 mg/dL (ref 0–99)
NonHDL: 115.22
Triglycerides: 86 mg/dL (ref 0.0–149.0)
VLDL: 17.2 mg/dL (ref 0.0–40.0)

## 2018-10-16 LAB — CBC WITH DIFFERENTIAL/PLATELET
BASOS PCT: 1.1 % (ref 0.0–3.0)
Basophils Absolute: 0.1 10*3/uL (ref 0.0–0.1)
Eosinophils Absolute: 0.1 10*3/uL (ref 0.0–0.7)
Eosinophils Relative: 2.1 % (ref 0.0–5.0)
HCT: 40.8 % (ref 39.0–52.0)
Hemoglobin: 13.8 g/dL (ref 13.0–17.0)
LYMPHS ABS: 1.5 10*3/uL (ref 0.7–4.0)
Lymphocytes Relative: 26.6 % (ref 12.0–46.0)
MCHC: 33.7 g/dL (ref 30.0–36.0)
MCV: 95.4 fl (ref 78.0–100.0)
MONOS PCT: 8.9 % (ref 3.0–12.0)
Monocytes Absolute: 0.5 10*3/uL (ref 0.1–1.0)
NEUTROS ABS: 3.4 10*3/uL (ref 1.4–7.7)
NEUTROS PCT: 61.3 % (ref 43.0–77.0)
PLATELETS: 267 10*3/uL (ref 150.0–400.0)
RBC: 4.28 Mil/uL (ref 4.22–5.81)
RDW: 13.4 % (ref 11.5–15.5)
WBC: 5.6 10*3/uL (ref 4.0–10.5)

## 2018-10-16 LAB — TSH: TSH: 1.87 u[IU]/mL (ref 0.35–4.50)

## 2018-10-16 LAB — BASIC METABOLIC PANEL
BUN: 26 mg/dL — ABNORMAL HIGH (ref 6–23)
CHLORIDE: 105 meq/L (ref 96–112)
CO2: 31 meq/L (ref 19–32)
CREATININE: 0.88 mg/dL (ref 0.40–1.50)
Calcium: 9.4 mg/dL (ref 8.4–10.5)
GFR: 87.01 mL/min (ref >60.00)
Glucose, Bld: 112 mg/dL — ABNORMAL HIGH (ref 70–99)
POTASSIUM: 4.6 meq/L (ref 3.5–5.1)
SODIUM: 141 meq/L (ref 135–145)

## 2018-10-16 LAB — HEMOGLOBIN A1C: Hgb A1c MFr Bld: 6 % (ref 4.6–6.5)

## 2018-10-16 NOTE — Assessment & Plan Note (Signed)
stable overall by history and exam, recent data reviewed with pt, and pt to continue medical treatment as before,  to f/u any worsening symptoms or concerns  

## 2018-10-16 NOTE — Patient Instructions (Addendum)
You had the Pneumovax pneumonia shot today  Please continue all other medications as before, and refills have been done if requested.  Please have the pharmacy call with any other refills you may need.  Please continue your efforts at being more active, low cholesterol diet, and weight control.  You are otherwise up to date with prevention measures today.  Please keep your appointments with your specialists as you may have planned  Please go to the LAB in the Basement (turn left off the elevator) for the tests to be done today  You will be contacted by phone if any changes need to be made immediately.  Otherwise, you will receive a letter about your results with an explanation, but please check with MyChart first.  Please remember to sign up for MyChart if you have not done so, as this will be important to you in the future with finding out test results, communicating by private email, and scheduling acute appointments online when needed.  Please return in 6 months, or sooner if needed

## 2018-10-16 NOTE — Progress Notes (Signed)
Subjective:    Patient ID: Darren Edwards, male    DOB: April 07, 1929, 84 y.o.   MRN: [id number]  HPI  Here to establish as pt in transfer; overall doing ok,  Pt denies chest pain, increasing sob or doe, wheezing, orthopnea, PND, increased LE swelling, palpitations, dizziness or syncope.  Pt denies new neurological symptoms such as new headache, or facial or extremity weakness or numbness.  Pt denies polydipsia, polyuria, or low sugar episode.  Pt states overall good compliance with meds, mostly trying to follow appropriate diet, with wt overall stable, and no new complaints  Denies worsening reflux, abd pain, dysphagia, n/v, bowel change or blood. Past Medical History:  Diagnosis Date  . Arthritis    Knee  . Cancer ([place])    HX SKIN CANCERS / HX PROSTATE CANCER  . GERD (gastroesophageal reflux disease)   . GERD (gastroesophageal reflux disease) 10/16/2018  . Hypercholesteremia   . Squamous cell skin cancer 10/16/2018  . 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 COLONOSCOPY  2011   w/Dr. Deatra Ina  . MICROLARYNGOSCOPY W/VOCAL CORD INJECTION Right 07/19/2016   Procedure: MICROLARYNGOSCOPY WITH EXCISION OF RIGHT  VOCAL CORD POLYP;  Surgeon: [name], MD;  Location: [facility];  Service: ENT;  Laterality: Right;  . PARTIAL KNEE ARTHROPLASTY Left 02/12/2016   Procedure: UNICOMPARTMENTAL LEFT KNEE MEDIALLY;  Surgeon: Paralee Cancel, MD;  Location: [facility];  Service: Orthopedics;  Laterality: Left;  . PROSTATECTOMY    . SKIN CANCER EXCISION    . TONSILLECTOMY      reports that he has been smoking cigars. He has never used smokeless tobacco. He reports current alcohol use of about 7.0 standard drinks of alcohol per week. He reports that he does not use drugs. family history is not on file. Allergies  Allergen Reactions  . Penicillins Rash    Has patient had a PCN reaction causing immediate rash,  facial/tongue/throat swelling, SOB or lightheadedness with hypotension: No Has patient had a PCN reaction causing severe rash involving mucus membranes or skin necrosis: No Has patient had a PCN reaction that required hospitalization No Has patient had a PCN reaction occurring within the last 10 years: No If all of the above answers are "NO", then may proceed with Cephalosporin use.   Current Outpatient Medications on File Prior to Visit  Medication Sig Dispense Refill  . aspirin 81 MG chewable tablet Chew 81 mg by mouth daily.    . cholecalciferol (VITAMIN D) 1000 units tablet Take 1,000 Units by mouth daily.    . clindamycin (CLEOCIN) 300 MG capsule Take 300 mg by mouth once as needed. Prior to dental appt    . Coenzyme Q10 (CO Q 10) 100 MG CAPS Take 1 capsule by mouth daily. 30 capsule 0  . docusate sodium (COLACE) 100 MG capsule Take 1 capsule (100 mg total) by mouth 2 (two) times daily. 10 capsule 0  . ferrous sulfate 325 (65 FE) MG tablet Take 1 tablet (325 mg total) by mouth 3 (three) times daily after meals.  3  . HYDROcodone-acetaminophen (NORCO) 7.5-325 MG tablet Take 1-2 tablets by mouth every 4 (four) hours as needed for moderate pain. 100 tablet 0  . polyethylene glycol (MIRALAX / GLYCOLAX) packet Take 17 g by mouth 2 (two) times daily. 14 each 0  . Probiotic Product (ALIGN PO) Take 1 tablet by mouth every morning.    Marland Kitchen  ranitidine (ZANTAC) 300 MG tablet Take 1 tablet (300 mg total) by mouth at bedtime. 30 tablet 0  . simvastatin (ZOCOR) 40 MG tablet Take 20 mg by mouth every evening.    Marland Kitchen tiZANidine (ZANAFLEX) 4 MG tablet Take 1 tablet (4 mg total) by mouth every 6 (six) hours as needed for muscle spasms. 40 tablet 0  . Undecylenic Ac-Zn Undecylenate ([employer]S ANTIFUNGAL MAXIMUM STR) 5-20 % OINT Apply 1 application topically daily.     No current facility-administered medications on file prior to visit.    Review of Systems  Constitutional: Negative for other unusual diaphoresis or  sweats HENT: Negative for ear discharge or swelling Eyes: Negative for other worsening visual disturbances Respiratory: Negative for stridor or other swelling  Gastrointestinal: Negative for worsening distension or other blood Genitourinary: Negative for retention or other urinary change Musculoskeletal: Negative for other MSK pain or swelling Skin: Negative for color change or other new lesions Neurological: Negative for worsening tremors and other numbness  Psychiatric/Behavioral: Negative for worsening agitation or other fatigue All other system neg per pt    Objective:   Physical Exam BP 128/70   Pulse 80   Temp 98.4 F (36.9 C) (Oral)   Ht 5\' 9"  (1.753 m)   Wt 136 lb (61.7 kg)   SpO2 96%   BMI 20.08 kg/m  VS noted,  Constitutional: Pt appears in NAD HENT: Head: NCAT.  Right Ear: External ear normal.  Left Ear: External ear normal.  Eyes: . Pupils are equal, round, and reactive to light. Conjunctivae and EOM are normal Nose: without d/c or deformity Neck: Neck supple. Gross normal ROM Cardiovascular: Normal rate and regular rhythm.   Pulmonary/Chest: Effort normal and breath sounds without rales or wheezing.  Abd:  Soft, NT, ND, + BS, no organomegaly Neurological: Pt is alert. At baseline orientation, motor grossly intact Skin: Skin is warm. No rashes, other new lesions, no LE edema Psychiatric: Pt behavior is normal without agitation  No other exam findings  Lab Results  Component Value Date   WBC 4.6 07/19/2016   HGB 13.2 07/19/2016   HCT 41.0 07/19/2016   PLT 211 07/19/2016   GLUCOSE 132 (H) 02/13/2016   CHOL 187 01/03/2016   TRIG 42.0 01/03/2016   HDL 68.80 01/03/2016   LDLCALC 110 (H) 01/03/2016   ALT 14 01/03/2016   AST 16 01/03/2016   NA 144 02/13/2016   K 4.5 02/13/2016   CL 108 02/13/2016   CREATININE 0.90 02/13/2016   BUN 19 02/13/2016   CO2 25 02/13/2016   TSH 1.68 01/03/2016   PSA 0.00 (L) 01/03/2016   INR 1.04 02/02/2016         Assessment & Plan:

## 2018-11-03 DIAGNOSIS — L905 Scar conditions and fibrosis of skin: Secondary | ICD-10-CM | POA: Diagnosis not present

## 2018-11-03 DIAGNOSIS — L570 Actinic keratosis: Secondary | ICD-10-CM | POA: Diagnosis not present

## 2018-11-03 DIAGNOSIS — Z85828 Personal history of other malignant neoplasm of skin: Secondary | ICD-10-CM | POA: Diagnosis not present

## 2018-12-08 DIAGNOSIS — R490 Dysphonia: Secondary | ICD-10-CM | POA: Diagnosis not present

## 2018-12-08 DIAGNOSIS — K219 Gastro-esophageal reflux disease without esophagitis: Secondary | ICD-10-CM | POA: Diagnosis not present

## 2018-12-08 DIAGNOSIS — Z8521 Personal history of malignant neoplasm of larynx: Secondary | ICD-10-CM | POA: Diagnosis not present

## 2018-12-08 DIAGNOSIS — Z7289 Other problems related to lifestyle: Secondary | ICD-10-CM | POA: Diagnosis not present

## 2019-01-21 ENCOUNTER — Emergency Department (HOSPITAL_COMMUNITY)
Admission: EM | Admit: 2019-01-21 | Discharge: 2019-01-21 | Disposition: A | Discharge status: Home / Self Care | Payer: Medicare Other | Attending: Emergency Medicine | Admitting: Emergency Medicine

## 2019-01-21 ENCOUNTER — Emergency Department (HOSPITAL_COMMUNITY): Payer: Medicare Other

## 2019-01-21 ENCOUNTER — Other Ambulatory Visit: Payer: Self-pay

## 2019-01-21 ENCOUNTER — Telehealth: Payer: Self-pay | Admitting: Internal Medicine

## 2019-01-21 DIAGNOSIS — Z79899 Other long term (current) drug therapy: Secondary | ICD-10-CM | POA: Insufficient documentation

## 2019-01-21 DIAGNOSIS — R0602 Shortness of breath: Secondary | ICD-10-CM | POA: Diagnosis not present

## 2019-01-21 DIAGNOSIS — R42 Dizziness and giddiness: Secondary | ICD-10-CM | POA: Diagnosis not present

## 2019-01-21 DIAGNOSIS — Z85828 Personal history of other malignant neoplasm of skin: Secondary | ICD-10-CM | POA: Insufficient documentation

## 2019-01-21 DIAGNOSIS — R531 Weakness: Secondary | ICD-10-CM | POA: Diagnosis not present

## 2019-01-21 DIAGNOSIS — F17290 Nicotine dependence, other tobacco product, uncomplicated: Secondary | ICD-10-CM | POA: Diagnosis not present

## 2019-01-21 DIAGNOSIS — Z7982 Long term (current) use of aspirin: Secondary | ICD-10-CM | POA: Insufficient documentation

## 2019-01-21 DIAGNOSIS — Z8546 Personal history of malignant neoplasm of prostate: Secondary | ICD-10-CM | POA: Insufficient documentation

## 2019-01-21 LAB — BASIC METABOLIC PANEL
ANION GAP: 8 (ref 5–15)
BUN: 21 mg/dL (ref 8–23)
CHLORIDE: 105 mmol/L (ref 98–111)
CO2: 26 mmol/L (ref 22–32)
Calcium: 9.2 mg/dL (ref 8.9–10.3)
Creatinine, Ser: 0.93 mg/dL (ref 0.61–1.24)
GFR calc Af Amer: >60 mL/min (ref >60)
GFR calc non Af Amer: >60 mL/min (ref >60)
GLUCOSE: 114 mg/dL — AB (ref 70–99)
POTASSIUM: 4 mmol/L (ref 3.5–5.1)
Sodium: 139 mmol/L (ref 135–145)

## 2019-01-21 LAB — CBC WITH DIFFERENTIAL/PLATELET
ABS IMMATURE GRANULOCYTES: 0.01 10*3/uL (ref 0.00–0.07)
BASOS PCT: 1 %
Basophils Absolute: 0 10*3/uL (ref 0.0–0.1)
Eosinophils Absolute: 0.1 10*3/uL (ref 0.0–0.5)
Eosinophils Relative: 1 %
HCT: 42.9 % (ref 39.0–52.0)
HEMOGLOBIN: 13.7 g/dL (ref 13.0–17.0)
IMMATURE GRANULOCYTES: 0 %
LYMPHS PCT: 23 %
Lymphs Abs: 1.2 10*3/uL (ref 0.7–4.0)
MCH: 30.6 pg (ref 26.0–34.0)
MCHC: 31.9 g/dL (ref 30.0–36.0)
MCV: 95.8 fL (ref 80.0–100.0)
MONOS PCT: 10 %
Monocytes Absolute: 0.5 10*3/uL (ref 0.1–1.0)
NEUTROS PCT: 65 %
Neutro Abs: 3.2 10*3/uL (ref 1.7–7.7)
PLATELETS: 251 10*3/uL (ref 150–400)
RBC: 4.48 MIL/uL (ref 4.22–5.81)
RDW: 12.6 % (ref 11.5–15.5)
WBC: 5 10*3/uL (ref 4.0–10.5)
nRBC: 0 % (ref 0.0–0.2)

## 2019-01-21 LAB — URINALYSIS, ROUTINE W REFLEX MICROSCOPIC
Bilirubin Urine: NEGATIVE
GLUCOSE, UA: NEGATIVE mg/dL
HGB URINE DIPSTICK: NEGATIVE
Ketones, ur: NEGATIVE mg/dL
LEUKOCYTE UA: NEGATIVE
Nitrite: NEGATIVE
PROTEIN: NEGATIVE mg/dL
SPECIFIC GRAVITY, URINE: 1.006 (ref 1.005–1.030)
pH: 7 (ref 5.0–8.0)

## 2019-01-21 LAB — I-STAT TROPONIN, ED: TROPONIN I, POC: 0.01 ng/mL (ref 0.00–0.08)

## 2019-01-21 NOTE — ED Triage Notes (Addendum)
Pt c/o generalized weakness / lightheaded that began this morning ; pt denies any chest pain/sob/fevers ; pt alert and oriented x 4 ; pt has equal grips , no neuro deficits ; pt states " I really want to get checked out for the corona virus because my wife has COPD and I dont want to get her sick"

## 2019-01-21 NOTE — ED Notes (Signed)
Hulen Mandler (wife) [phone]

## 2019-01-21 NOTE — Telephone Encounter (Signed)
Patient had called and hung up before leaving a message on the [facility] Covid line, I called the patient, left VM to return call back to the doctor's office.

## 2019-01-21 NOTE — ED Provider Notes (Signed)
[facility] EMERGENCY DEPARTMENT Provider Note   CSN: [id number] Arrival date & time: 01/21/19  1723    History   Chief Complaint Chief Complaint  Patient presents with  . Weakness    HPI Darren Edwards is a 85 y.o. male with history of GERD, hyperlipidemia presenting to emergency department today with chief complaint of weakness and lightheadedness.  Patient states this morning when he was walking to the mailbox his legs felt weak.  Later in the afternoon when he was driving to the drugstore which is a mile away he began to feel lightheaded.  He describes it as he had to work harder to concentrate.  Patient denies loss of consciousness, syncope, headache, visual changes, chest pain, shortness of breath, back pain.  Patient states he is concerned for coronavirus.  He lives at home with his wife who has COPD and he is worried about getting her sick.  Patient denies cough, shortness of breath, fever.  Denies sick contacts and recent travel. Pt denies known contact with confirmed positive contact for covid-19.  History provided by pt.  Past Medical History:  Diagnosis Date  . Arthritis    Knee  . Cancer ([place])    HX SKIN CANCERS / HX PROSTATE CANCER  . GERD (gastroesophageal reflux disease)   . GERD (gastroesophageal reflux disease) 10/16/2018  . Hypercholesteremia   . Squamous cell skin cancer 10/16/2018  . 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 Squamous cell skin cancer 10/16/2018  . GERD (gastroesophageal reflux disease) 10/16/2018  . Chronic hoarseness 10/16/2018  . Hyperglycemia 10/16/2018  . S/P left UKR 02/12/2016  . Status post unilateral knee replacement 02/12/2016  . External hemorrhoid, bleeding 08/11/2014  . Trochanteric bursitis or tendonitis 09/28/2013  . Vocal cord nodule 09/01/2013  . Greater trochanteric  bursitis of left hip 12/23/2012  . Degenerative tear of medial meniscus 07/01/2012  . Prepatellar bursitis 04/01/2012  . Left knee pain 01/23/2012  . HOARSENESS 06/20/2010  . RECTAL BLEEDING 02/05/2010  . ALLERGIC RHINITIS 10/04/2009  . Melanoma of skin ([place]) 12/30/2007  . ADENOCARCINOMA, PROSTATE 12/30/2007  . CHRONIC GRANULOMATOUS DISEASE 12/30/2007  . Essential hypertension 12/30/2007  . ASTHMATIC BRONCHITIS, ACUTE 12/30/2007  . DIVERTICULOSIS OF COLON 12/30/2007  . RENAL CYST 12/30/2007  . [place] DISEASE 12/30/2007  . HYPERCHOLESTEROLEMIA 12/22/2007    Past Surgical History:  Procedure Laterality Date  . COLONOSCOPY  2011   w/Dr. Deatra Ina  . MICROLARYNGOSCOPY W/VOCAL CORD INJECTION Right 07/19/2016   Procedure: MICROLARYNGOSCOPY WITH EXCISION OF RIGHT  VOCAL CORD POLYP;  Surgeon: [name], MD;  Location: [facility];  Service: ENT;  Laterality: Right;  . PARTIAL KNEE ARTHROPLASTY Left 02/12/2016   Procedure: UNICOMPARTMENTAL LEFT KNEE MEDIALLY;  Surgeon: Paralee Cancel, MD;  Location: [facility];  Service: Orthopedics;  Laterality: Left;  . PROSTATECTOMY    . SKIN CANCER EXCISION    . TONSILLECTOMY          Home Medications    Prior to Admission medications   Medication Sig Start Date End Date Taking? Authorizing Provider  aspirin 81 MG chewable tablet Chew 81 mg by mouth daily.   Yes [provider]  cholecalciferol (VITAMIN D) 1000 units tablet Take 1,000 Units by mouth daily.   Yes [provider]  clindamycin (CLEOCIN) 300 MG capsule Take 300-900 mg by mouth See  admin instructions. Take 300-900 mg by mouth one hour prior to dental procedure 07/15/16  Yes [provider]  Coenzyme Q10 (CO Q 10) 100 MG CAPS Take 100 mg by mouth daily.  01/04/16  Yes Noralee Space, MD  ibuprofen (ADVIL,MOTRIN) 200 MG tablet Take 200-400 mg by mouth every 8 (eight) hours as needed (for pain or muscle soreness).   Yes [provider]  Multiple  Vitamins-Minerals (ONE-A-DAY MENS 50+ ADVANTAGE) TABS Take 1 tablet by mouth daily after breakfast.   Yes [provider]  Probiotic Product (ALIGN PO) Take 1 capsule by mouth every morning.    Yes [provider]  ranitidine (ZANTAC) 75 MG tablet Take 75 mg by mouth daily before supper.   Yes [provider]  simvastatin (ZOCOR) 20 MG tablet Take 20 mg by mouth at bedtime.   Yes [provider]  docusate sodium (COLACE) 100 MG capsule Take 1 capsule (100 mg total) by mouth 2 (two) times daily. Patient not taking: Reported on 01/21/2019 02/13/16   Danae Orleans, PA-C  ferrous sulfate 325 (65 FE) MG tablet Take 1 tablet (325 mg total) by mouth 3 (three) times daily after meals. Patient not taking: Reported on 01/21/2019 02/13/16   Danae Orleans, PA-C  HYDROcodone-acetaminophen (NORCO) 7.5-325 MG tablet Take 1-2 tablets by mouth every 4 (four) hours as needed for moderate pain. Patient not taking: Reported on 01/21/2019 02/13/16   Danae Orleans, PA-C  polyethylene glycol (MIRALAX / Floria Raveling) packet Take 17 g by mouth 2 (two) times daily. Patient not taking: Reported on 01/21/2019 02/13/16   Danae Orleans, PA-C  tiZANidine (ZANAFLEX) 4 MG tablet Take 1 tablet (4 mg total) by mouth every 6 (six) hours as needed for muscle spasms. Patient not taking: Reported on 01/21/2019 02/13/16   Danae Orleans, PA-C    Family History Family History  Problem Relation Age of Onset  . Colon cancer Neg Hx   . Colon polyps Neg Hx   . Diabetes Neg Hx   . Esophageal cancer Neg Hx     Social History Social History   Tobacco Use  . Smoking status: Current Some Day Smoker    Types: Cigars  . Smokeless tobacco: Never Used  . Tobacco comment: rare- not in a long time  Substance Use Topics  . Alcohol use: Yes    Alcohol/week: 7.0 standard drinks    Types: 7 Glasses of wine per week    Comment: social   . Drug use: No     Allergies   Penicillins   Review of Systems  Review of Systems  Constitutional: Negative for chills and fever.  HENT: Negative for congestion, rhinorrhea, sinus pressure and sore throat.   Eyes: Negative for pain, redness and visual disturbance.  Respiratory: Negative for cough, chest tightness, shortness of breath and wheezing.   Cardiovascular: Negative for chest pain, palpitations and leg swelling.  Gastrointestinal: Negative for abdominal pain, constipation, diarrhea, nausea and vomiting.  Genitourinary: Negative for dysuria.  Musculoskeletal: Negative for arthralgias, back pain, myalgias and neck pain.  Skin: Negative for rash and wound.  Neurological: Positive for weakness and light-headedness. Negative for dizziness, syncope, numbness and headaches.  Psychiatric/Behavioral: Negative for confusion.     Physical Exam Updated Vital Signs BP (!) 158/92   Pulse 69   Temp 98.4 F (36.9 C) (Oral)   Resp 19   SpO2 95%   Physical Exam Vitals signs and nursing note reviewed.  Constitutional:      Appearance: He  is well-developed. He is not ill-appearing or toxic-appearing.  HENT:     Head: Normocephalic and atraumatic.     Nose: Nose normal.     Mouth/Throat:     Comments: No erythema to oropharynx, no edema, no exudate, no tonsillar swelling, voice normal, neck supple without lymphadenopathy  Eyes:     General: No scleral icterus.       Right eye: No discharge.        Left eye: No discharge.     Extraocular Movements: Extraocular movements intact.     Conjunctiva/sclera: Conjunctivae normal.     Pupils: Pupils are equal, round, and reactive to light.  Neck:     Musculoskeletal: Normal range of motion.  Cardiovascular:     Rate and Rhythm: Normal rate and regular rhythm.     Pulses: Normal pulses.     Heart sounds: Normal heart sounds.  Pulmonary:     Effort: Pulmonary effort is normal.     Breath sounds: Normal breath sounds.  Abdominal:     General: There is no distension.     Palpations: Abdomen is soft.      Tenderness: There is no abdominal tenderness. There is no guarding or rebound.  Musculoskeletal: Normal range of motion.  Skin:    General: Skin is warm and dry.  Neurological:     Mental Status: He is oriented to person, place, and time.     Comments: Speech is clear and goal oriented, follows commands CN III-XII intact, no facial droop Normal strength in upper and lower extremities bilaterally including dorsiflexion and plantar flexion, strong and equal grip strength Sensation normal to light and sharp touch Moves extremities without ataxia, coordination intact Normal finger to nose and rapid alternating movements Normal gait and balance   Psychiatric:        Behavior: Behavior normal.      ED Treatments / Results  Labs (all labs ordered are listed, but only abnormal results are displayed) Labs Reviewed  URINALYSIS, ROUTINE W REFLEX MICROSCOPIC - Abnormal; Notable for the following components:      Result Value   Color, Urine STRAW (*)    All other components within normal limits  BASIC METABOLIC PANEL - Abnormal; Notable for the following components:   Glucose, Bld 114 (*)    All other components within normal limits  CBC WITH DIFFERENTIAL/PLATELET  I-STAT TROPONIN, ED    EKG EKG Interpretation  Date/Time:  Thursday January 21 2019 18:42:47 EDT Ventricular Rate:  71 PR Interval:    QRS Duration: 97 QT Interval:  426 QTC Calculation: 463 R Axis:   57 Text Interpretation:  Sinus rhythm Anteroseptal infarct, age indeterminate No old tracing to compare Confirmed by Merrily Pew [phone]) on 01/21/2019 7:16:08 PM Also confirmed by Merrily Pew [phone]), editor Philomena Doheny [phone])  on 01/22/2019 6:54:24 AM   Radiology Dg Chest 2 View  Result Date: 01/21/2019 CLINICAL DATA:  Weakness and short of breath for 1 day. EXAM: CHEST - 2 VIEW COMPARISON:  01/03/2016 FINDINGS: Cardiac silhouette is normal in size. No mediastinal or hilar masses. No evidence of lymphadenopathy.  Clear lungs. No pleural effusion or pneumothorax. Lungs are mildly hyperexpanded. Skeletal structures are intact. IMPRESSION: No active cardiopulmonary disease. Electronically Signed   By: Lajean Manes M.D.   On: 01/21/2019 18:56    Procedures Procedures (including critical care time)  Medications Ordered in ED Medications - No data to display   Initial Impression / Assessment and Plan / ED Course  I have reviewed the triage vital signs and the nursing notes.  Pertinent labs & imaging results that were available during my care of the patient were reviewed by me and considered in my medical decision making (see chart for details).   Pt reports bilateral lower extremity weakness when walking earlier and feeling light headed. His light headedness resolved spontaneously. Pt admits  That he only came to the ED to get tested for coronavirus after looking on the Internet and seeing that weakness is a symptom. Pt has no respiratory complaints, no fever. On exam his lungs are clear and SpO2 is 98% on room air. Chest xray viewed by me does not show infiltrate, agree with radiologist ready. I viewed his EKG which shows no ischemic changes. Discussed with pt he is low risk for coronavirus at this time and he does not meet testing criteria today. His neuro exam is without focal deficit. Labs are unremarkable including CMP, CBC, UA. Negative troponin. Discussed with pt that if his weakness could be caused by neurology or cardiac etiologies and he would need a head CT for further evaluation or continued observation at the least. Pt reports he only wanted coronavirus testing and declines further workup.  Patient noted to be hypertensive in the emergency department.  No signs of hypertensive urgency.  He states this happens when he comes to the doctor or hospital. Discussed with patient the need for close follow-up and management by his pcp.  Patient is comfortable with above plan and is stable for discharge at  this time. All questions were answered prior to disposition. Strict return precautions for returning to the ED were discussed. Encouraged follow up with PCP. The patient  was discussed with and seen by Dr. Dayna Barker who agrees with the treatment plan.   This note was prepared with assistance of Systems analyst. Occasional wrong-word or sound-a-like substitutions may have occurred due to the inherent limitations of voice recognition software.   Final Clinical Impressions(s) / ED Diagnoses   Final diagnoses:  Weakness    ED Discharge Orders    None       Flint Melter 01/22/19 2105    Merrily Pew, MD 01/25/19 [phone]

## 2019-01-21 NOTE — ED Provider Notes (Signed)
Medical screening examination/treatment/procedure(s) were conducted as a shared visit with non-physician practitioner(s) and myself.  I personally evaluated the patient during the encounter.  85 year old male who has some weakness in bilateral lower extremities and some lightheadedness and felt like he needed to be checked for the coronavirus because he read online that that could be a sign.  No cough no fever no known sick contacts no recent travels.  Patient on exam is neurologically intact.  He has symmetric strength of bilateral lower and upper extremities.  Symmetric face, tongue protrusion and extraocular movements are intact with normal pupillary dilation constriction.  He is thinking clearer and speaking normally.  Slightly hypertensive he states he took his blood pressure earlier today with the symptoms and then his blood pressure was 136 and his blood pressure often gets high when he is seen by the doctor. Gust that the patient did not meeting criteria for coronavirus at this time.  I told him that we will be more than happy to evaluate him for stroke or cardiac causes for symptoms but he is really only interested in being checked for coronavirus.  His work-up as done was normal and I think it is low likelihood that anything else is good to be going on so he was discharged in stable condition slightly hypertensive. Knows reasons to come back to ED vs FU w/ PCP.  EKG Interpretation  Date/Time:  Thursday January 21 2019 18:42:47 EDT Ventricular Rate:  71 PR Interval:    QRS Duration: 97 QT Interval:  426 QTC Calculation: 463 R Axis:   57 Text Interpretation:  Sinus rhythm Anteroseptal infarct, age indeterminate No old tracing to compare Confirmed by Merrily Pew [phone]) on 01/21/2019 7:16:08 PM     Niesha Bame, Corene Cornea, MD 01/22/19 2440

## 2019-01-21 NOTE — Discharge Instructions (Addendum)
You have been seen today for weakness. Please read and follow all provided instructions. Return to the emergency room for worsening condition or new concerning symptoms.    1. Medications: Continue usual home medications Take medications as prescribed. Please review all of the medicines and only take them if you do not have an allergy to them.   Also check your blood pressure once a day for the next several days. It was elevated in the emergency department today. If it continues to be high please follow up with your pcp for further evaluation.  2. Treatment: rest, drink plenty of fluids 3. Follow Up: Please follow up with your primary doctor in 2-5 days for discussion of your diagnoses and further evaluation after today's visit; Call today to arrange your follow up.   Return to the emergency department for worsening symptoms to include fever over 100.4, cough, and shortness of breath. If you experience these symptoms please self quarantine to help avoid spreading a virus. At t his time you are low risk for coronavirus.

## 2019-01-22 ENCOUNTER — Encounter: Payer: Self-pay | Admitting: Internal Medicine

## 2019-01-22 ENCOUNTER — Ambulatory Visit: Payer: Self-pay

## 2019-01-22 NOTE — Telephone Encounter (Signed)
Spoke with patients wife who said she took her husband to the ER today for evaluation.  She had called to report that her husband was dizzy.  She states that they really didn't find a reason for his dizziness but told him to follow up with Dr Jenny Reichmann. Appointment scheduled for Monday. E-mail verified. Wife is ok with Web ex visit.

## 2019-01-25 ENCOUNTER — Encounter: Payer: Self-pay | Admitting: Internal Medicine

## 2019-01-25 ENCOUNTER — Ambulatory Visit (INDEPENDENT_AMBULATORY_CARE_PROVIDER_SITE_OTHER): Payer: Medicare Other | Admitting: Internal Medicine

## 2019-01-25 DIAGNOSIS — R42 Dizziness and giddiness: Secondary | ICD-10-CM

## 2019-01-25 DIAGNOSIS — R739 Hyperglycemia, unspecified: Secondary | ICD-10-CM | POA: Diagnosis not present

## 2019-01-25 DIAGNOSIS — R55 Syncope and collapse: Secondary | ICD-10-CM | POA: Diagnosis not present

## 2019-01-25 NOTE — Assessment & Plan Note (Signed)
stable overall by history and exam, recent data reviewed with pt, and pt to continue medical treatment as before,  to f/u any worsening symptoms or concerns  

## 2019-01-25 NOTE — Addendum Note (Signed)
Addended by: [name] on: 01/25/2019 07:47 PM   Modules accepted: Orders

## 2019-01-25 NOTE — Progress Notes (Signed)
Patient ID: Darren Edwards, male   DOB: 1929-02-10, 85 y.o.   MRN: [id number]  Virtual Visit via Video Note  I connected with Virgie Dad on 01/25/19 at  1:20 PM EDT by a video enabled telemedicine application and verified that I am speaking with the correct person using two identifiers.  Pt is at home, I am in office, wife is also present   I discussed the limitations of evaluation and management by telemedicine and the availability of in person appointments. The patient expressed understanding and agreed to proceed.  History of Present Illness: Here to f/u with c/o persistent dizziness after seen in ED recently, hard to characterize but mostly a sense of lightheadedness that seems to occur randomly but also with standing up at times, intermittent, mild to moderate, for several weeks, and associated with brief LE weakness.  Pt denies chest pain, increased sob or doe, wheezing, orthopnea, PND, increased LE swelling, palpitations, or syncope.  Pt denies new neurological symptoms such as new headache, or facial or extremity pain or numbness.    Pt denies fever, wt loss, night sweats, loss of appetite, or other constitutional symptoms  BP at home has always been normal to slight increased at times.  Taking po well, without significant subjective wt loss.  Denies worsening depressive symptoms, suicidal ideation, or panic; has ongoing anxiety, not increased recently.   Recent labs at ED are essentially negative for significant abnormal.  At the time pt was mostly interested in COVID19 testing, but admits to no fever, cough or sob.  Has not had signficant cardio or neuro evaluation, but wife is urging him now to do this   Past Medical History:  Diagnosis Date  . Arthritis    Knee  . Cancer ([place])    HX SKIN CANCERS / HX PROSTATE CANCER  . GERD (gastroesophageal reflux disease)   . GERD (gastroesophageal reflux disease) 10/16/2018  . Hypercholesteremia   . Squamous cell skin cancer 10/16/2018  . Vocal  cord cancer ([place]) 10/16/2018   Superficially invasive squamous cell    Past Surgical History:  Procedure Laterality Date  . COLONOSCOPY  2011   w/Dr. Deatra Ina  . MICROLARYNGOSCOPY W/VOCAL CORD INJECTION Right 07/19/2016   Procedure: MICROLARYNGOSCOPY WITH EXCISION OF RIGHT  VOCAL CORD POLYP;  Surgeon: [name], MD;  Location: [facility];  Service: ENT;  Laterality: Right;  . PARTIAL KNEE ARTHROPLASTY Left 02/12/2016   Procedure: UNICOMPARTMENTAL LEFT KNEE MEDIALLY;  Surgeon: Paralee Cancel, MD;  Location: [facility];  Service: Orthopedics;  Laterality: Left;  . PROSTATECTOMY    . SKIN CANCER EXCISION    . TONSILLECTOMY      reports that he has been smoking cigars. He has never used smokeless tobacco. He reports current alcohol use of about 7.0 standard drinks of alcohol per week. He reports that he does not use drugs. family history is not on file. Allergies  Allergen Reactions  . Penicillins Rash    Has patient had a PCN reaction causing immediate rash, facial/tongue/throat swelling, SOB or lightheadedness with hypotension: Yes Has patient had a PCN reaction causing severe rash involving mucus membranes or skin necrosis: No Has patient had a PCN reaction that required hospitalization: No Has patient had a PCN reaction occurring within the last 10 years: No If all of the above answers are "NO", then may proceed with Cephalosporin use.   Current Outpatient Medications on File Prior to Visit  Medication Sig Dispense Refill  . aspirin 81 MG chewable tablet Chew  81 mg by mouth daily.    . cholecalciferol (VITAMIN D) 1000 units tablet Take 1,000 Units by mouth daily.    . clindamycin (CLEOCIN) 300 MG capsule Take 300-900 mg by mouth See admin instructions. Take 300-900 mg by mouth one hour prior to dental procedure    . Coenzyme Q10 (CO Q 10) 100 MG CAPS Take 100 mg by mouth daily.  30 capsule 0  . docusate sodium (COLACE) 100 MG capsule Take 1 capsule (100 mg total) by mouth 2 (two) times daily.  (Patient not taking: Reported on 01/21/2019) 10 capsule 0  . ferrous sulfate 325 (65 FE) MG tablet Take 1 tablet (325 mg total) by mouth 3 (three) times daily after meals. (Patient not taking: Reported on 01/21/2019)  3  . HYDROcodone-acetaminophen (NORCO) 7.5-325 MG tablet Take 1-2 tablets by mouth every 4 (four) hours as needed for moderate pain. (Patient not taking: Reported on 01/21/2019) 100 tablet 0  . ibuprofen (ADVIL,MOTRIN) 200 MG tablet Take 200-400 mg by mouth every 8 (eight) hours as needed (for pain or muscle soreness).    . Multiple Vitamins-Minerals (ONE-A-DAY MENS 50+ ADVANTAGE) TABS Take 1 tablet by mouth daily after breakfast.    . polyethylene glycol (MIRALAX / GLYCOLAX) packet Take 17 g by mouth 2 (two) times daily. (Patient not taking: Reported on 01/21/2019) 14 each 0  . Probiotic Product (ALIGN PO) Take 1 capsule by mouth every morning.     . ranitidine (ZANTAC) 75 MG tablet Take 75 mg by mouth daily before supper.    . simvastatin (ZOCOR) 20 MG tablet Take 20 mg by mouth at bedtime.    Marland Kitchen tiZANidine (ZANAFLEX) 4 MG tablet Take 1 tablet (4 mg total) by mouth every 6 (six) hours as needed for muscle spasms. (Patient not taking: Reported on 01/21/2019) 40 tablet 0   No current facility-administered medications on file prior to visit.    Observations/Objective: Alert, cn 2-12 intact, not ill appearing or in pain, does not appear overly dizzy sitting up for this visit in chair  Lab Results  Component Value Date   WBC 5.0 01/21/2019   HGB 13.7 01/21/2019   HCT 42.9 01/21/2019   PLT 251 01/21/2019   GLUCOSE 114 (H) 01/21/2019   CHOL 168 10/16/2018   TRIG 86.0 10/16/2018   HDL 52.90 10/16/2018   LDLCALC 98 10/16/2018   ALT 12 10/16/2018   AST 16 10/16/2018   NA 139 01/21/2019   K 4.0 01/21/2019   CL 105 01/21/2019   CREATININE 0.93 01/21/2019   BUN 21 01/21/2019   CO2 26 01/21/2019   TSH 1.87 10/16/2018   PSA 0.00 (L) 01/03/2016   INR 1.04 02/02/2016   HGBA1C 6.0  10/16/2018   Assessment and Plan: Dizziness  - ? orthostasis like, not on diuretic and doubt med related, will need orthostatic BPs checked with next inperson visit; for now will check MRI brain r/o NPH or occipital stroke, carotids, and Echo  Hyperglycemia - stable overall by history and exam, recent data reviewed with pt, and pt to continue medical treatment as before,  to f/u any worsening symptoms or concerns  Follow Up Instructions: For tests as ordered,  to f/u any worsening symptoms or concerns, consider neuro or cardiac referrals   I discussed the assessment and treatment plan with the patient. The patient was provided an opportunity to ask questions and all were answered. The patient agreed with the plan and demonstrated an understanding of the instructions.   The patient  was advised to call back or seek an in-person evaluation if the symptoms worsen or if the condition fails to improve as anticipated.   Cathlean Cower, MD

## 2019-01-25 NOTE — Telephone Encounter (Signed)
Webex visit set up.  Called and left message with instructions on how to download and set up visit.

## 2019-01-25 NOTE — Patient Instructions (Signed)
See above

## 2019-01-25 NOTE — Assessment & Plan Note (Signed)
Etiology unclear, by hx sounds like orthostatic like but cant r/o other; will need to check orthostatics at next visit, o/w for labs as ordered, see above

## 2019-01-26 ENCOUNTER — Telehealth: Payer: Self-pay

## 2019-01-26 ENCOUNTER — Telehealth (HOSPITAL_COMMUNITY): Payer: Self-pay | Admitting: Rehabilitation

## 2019-01-26 NOTE — Telephone Encounter (Signed)
The above patient or their representative was contacted and gave the following answers to these questions:         Do you have any of the following symptoms? Low-grade fever  Fever                    Cough                   Shortness of breath  Do  you have any of the following other symptoms? No   muscle pain         vomiting,        diarrhea        rash         weakness        red eye        abdominal pain         bruising          bruising or bleeding              joint pain           severe headache    Have you been in contact with someone who was or has been sick in the past 2 weeks? No  Yes                 Unsure                         Unable to assess   Does the person that you were in contact with have any of the following symptoms? N/A  Cough         shortness of breath           muscle pain         vomiting,            diarrhea            rash            weakness           fever            red eye           abdominal pain           bruising  or  bleeding                joint pain                severe headache               Have you  or someone you have been in contact with traveled internationally in the last month?  No       If yes, which countries?   Have you  or someone you have been in contact with traveled outside [place] in the last month?  No       If yes, which state and city?   COMMENTS OR ACTION PLAN FOR THIS PATIENT:

## 2019-01-26 NOTE — Telephone Encounter (Signed)
Per PCP recommendations it would be ok to push appt back a week. Crystal has been informed and will reschedule the patient.     Copied from [place] [id number]. Topic: General - Inquiry >> Jan 26, 2019  1:26 PM Margot Ables wrote: Reason for CRM: [facility] called pt/wife about US Carotid ordered by Dr. Jenny Reichmann as STAT. Pt wife indicates pt has had fever 99.6 over past 2 days. Crystal is concerned about having pt come in at this time. Pt is scheduled for 4/1 9:45am and asking if this can be moved out a week due to symptoms and pts age. Please advise.

## 2019-01-27 ENCOUNTER — Encounter (HOSPITAL_COMMUNITY): Payer: Medicare Other

## 2019-01-28 ENCOUNTER — Other Ambulatory Visit: Payer: Medicare Other

## 2019-02-01 ENCOUNTER — Telehealth: Payer: Self-pay | Admitting: Internal Medicine

## 2019-02-01 MED ORDER — MECLIZINE HCL 12.5 MG PO TABS: 12.5000 mg | ORAL_TABLET | Freq: Three times a day (TID) | ORAL | 1 refills | Status: AC | PRN

## 2019-02-01 NOTE — Addendum Note (Signed)
Addended by: [name] on: 02/01/2019 01:03 PM   Modules accepted: Orders

## 2019-02-01 NOTE — Telephone Encounter (Signed)
Pt. And wife calling to report he still feels weak. No fever. Was supposed to have a carotid ultrasound tomorrow, but they changed the appointment. Want Dr. Jenny Reichmann to know and ask if he has any advice.

## 2019-02-01 NOTE — Telephone Encounter (Signed)
Pt has been informed and expressed understanding.  

## 2019-02-01 NOTE — Telephone Encounter (Addendum)
[place] for trial meclizine prn dizziness - I will send

## 2019-02-02 ENCOUNTER — Ambulatory Visit (HOSPITAL_COMMUNITY): Payer: Medicare Other

## 2019-02-03 ENCOUNTER — Other Ambulatory Visit: Payer: Self-pay

## 2019-02-03 ENCOUNTER — Ambulatory Visit (HOSPITAL_COMMUNITY): Payer: Medicare Other | Attending: Cardiology

## 2019-02-03 DIAGNOSIS — R42 Dizziness and giddiness: Secondary | ICD-10-CM | POA: Diagnosis not present

## 2019-02-03 DIAGNOSIS — R55 Syncope and collapse: Secondary | ICD-10-CM | POA: Diagnosis not present

## 2019-02-04 ENCOUNTER — Encounter: Payer: Self-pay | Admitting: Internal Medicine

## 2019-02-04 ENCOUNTER — Other Ambulatory Visit: Payer: Medicare Other

## 2019-02-08 ENCOUNTER — Telehealth (HOSPITAL_COMMUNITY): Payer: Self-pay | Admitting: Rehabilitation

## 2019-02-08 NOTE — Telephone Encounter (Signed)
The above patient or their representative was contacted and gave the following answers to these questions:         Do you have any of the following symptoms? No  Fever                    Cough                   Shortness of breath  Do  you have any of the following other symptoms? No   muscle pain         vomiting,        diarrhea        rash         weakness        red eye        abdominal pain         bruising          bruising or bleeding              joint pain           severe headache    Have you been in contact with someone who was or has been sick in the past 2 weeks? No  Yes                 Unsure                         Unable to assess   Does the person that you were in contact with have any of the following symptoms? No  Cough         shortness of breath           muscle pain         vomiting,            diarrhea            rash            weakness           fever            red eye           abdominal pain           bruising  or  bleeding                joint pain                severe headache               Have you  or someone you have been in contact with traveled internationally in th last month? No         If yes, which countries? n/a   Have you  or someone you have been in contact with traveled outside [place] in th last month? No        If yes, which state and city?  n/a   COMMENTS OR ACTION PLAN FOR THIS PATIENT:

## 2019-02-09 ENCOUNTER — Other Ambulatory Visit: Payer: Self-pay

## 2019-02-09 ENCOUNTER — Ambulatory Visit (HOSPITAL_COMMUNITY)
Admission: RE | Admit: 2019-02-09 | Discharge: 2019-02-09 | Disposition: A | Discharge status: Home / Self Care | Payer: Medicare Other | Source: Ambulatory Visit | Attending: Internal Medicine | Admitting: Internal Medicine

## 2019-02-09 ENCOUNTER — Encounter: Payer: Self-pay | Admitting: Internal Medicine

## 2019-02-09 DIAGNOSIS — R42 Dizziness and giddiness: Secondary | ICD-10-CM | POA: Diagnosis not present

## 2019-02-09 DIAGNOSIS — R55 Syncope and collapse: Secondary | ICD-10-CM | POA: Insufficient documentation

## 2019-02-11 ENCOUNTER — Other Ambulatory Visit: Payer: Self-pay

## 2019-02-11 ENCOUNTER — Ambulatory Visit
Admission: RE | Admit: 2019-02-11 | Discharge: 2019-02-11 | Disposition: A | Discharge status: Home / Self Care | Payer: Medicare Other | Source: Ambulatory Visit | Attending: Internal Medicine | Admitting: Internal Medicine

## 2019-02-11 DIAGNOSIS — R55 Syncope and collapse: Secondary | ICD-10-CM

## 2019-02-11 DIAGNOSIS — R42 Dizziness and giddiness: Secondary | ICD-10-CM

## 2019-02-14 ENCOUNTER — Encounter: Payer: Self-pay | Admitting: Internal Medicine

## 2019-05-06 DIAGNOSIS — I8311 Varicose veins of right lower extremity with inflammation: Secondary | ICD-10-CM | POA: Diagnosis not present

## 2019-05-06 DIAGNOSIS — L570 Actinic keratosis: Secondary | ICD-10-CM | POA: Diagnosis not present

## 2019-05-06 DIAGNOSIS — L578 Other skin changes due to chronic exposure to nonionizing radiation: Secondary | ICD-10-CM | POA: Diagnosis not present

## 2019-05-06 DIAGNOSIS — L821 Other seborrheic keratosis: Secondary | ICD-10-CM | POA: Diagnosis not present

## 2019-05-06 DIAGNOSIS — L819 Disorder of pigmentation, unspecified: Secondary | ICD-10-CM | POA: Diagnosis not present

## 2019-05-06 DIAGNOSIS — L814 Other melanin hyperpigmentation: Secondary | ICD-10-CM | POA: Diagnosis not present

## 2019-05-06 DIAGNOSIS — D1801 Hemangioma of skin and subcutaneous tissue: Secondary | ICD-10-CM | POA: Diagnosis not present

## 2019-05-06 DIAGNOSIS — Z85828 Personal history of other malignant neoplasm of skin: Secondary | ICD-10-CM | POA: Diagnosis not present

## 2019-05-06 DIAGNOSIS — I8312 Varicose veins of left lower extremity with inflammation: Secondary | ICD-10-CM | POA: Diagnosis not present

## 2019-05-06 DIAGNOSIS — D229 Melanocytic nevi, unspecified: Secondary | ICD-10-CM | POA: Diagnosis not present

## 2019-06-25 DIAGNOSIS — K219 Gastro-esophageal reflux disease without esophagitis: Secondary | ICD-10-CM | POA: Diagnosis not present

## 2019-06-25 DIAGNOSIS — Z8521 Personal history of malignant neoplasm of larynx: Secondary | ICD-10-CM | POA: Diagnosis not present

## 2019-06-25 DIAGNOSIS — R490 Dysphonia: Secondary | ICD-10-CM | POA: Diagnosis not present

## 2019-06-25 DIAGNOSIS — Z7289 Other problems related to lifestyle: Secondary | ICD-10-CM | POA: Diagnosis not present

## 2019-07-01 ENCOUNTER — Encounter: Payer: Self-pay | Admitting: Internal Medicine

## 2019-07-01 DIAGNOSIS — D485 Neoplasm of uncertain behavior of skin: Secondary | ICD-10-CM | POA: Diagnosis not present

## 2019-07-01 DIAGNOSIS — Z85828 Personal history of other malignant neoplasm of skin: Secondary | ICD-10-CM | POA: Diagnosis not present

## 2019-07-01 DIAGNOSIS — L820 Inflamed seborrheic keratosis: Secondary | ICD-10-CM | POA: Diagnosis not present

## 2019-07-01 DIAGNOSIS — L570 Actinic keratosis: Secondary | ICD-10-CM | POA: Diagnosis not present

## 2019-07-01 DIAGNOSIS — L905 Scar conditions and fibrosis of skin: Secondary | ICD-10-CM | POA: Diagnosis not present

## 2019-07-08 ENCOUNTER — Inpatient Hospital Stay: Payer: Medicare Other | Attending: Genetic Counselor | Admitting: Genetic Counselor

## 2019-07-08 ENCOUNTER — Encounter: Payer: Self-pay | Admitting: Genetic Counselor

## 2019-07-08 ENCOUNTER — Other Ambulatory Visit: Payer: Self-pay

## 2019-07-08 ENCOUNTER — Inpatient Hospital Stay: Payer: Medicare Other

## 2019-07-08 DIAGNOSIS — C439 Malignant melanoma of skin, unspecified: Secondary | ICD-10-CM

## 2019-07-08 DIAGNOSIS — C4492 Squamous cell carcinoma of skin, unspecified: Secondary | ICD-10-CM

## 2019-07-08 DIAGNOSIS — C61 Malignant neoplasm of prostate: Secondary | ICD-10-CM | POA: Diagnosis not present

## 2019-07-08 DIAGNOSIS — Z8481 Family history of carrier of genetic disease: Secondary | ICD-10-CM

## 2019-07-08 DIAGNOSIS — Z801 Family history of malignant neoplasm of trachea, bronchus and lung: Secondary | ICD-10-CM

## 2019-07-08 DIAGNOSIS — Z800 Family history of malignant neoplasm of digestive organs: Secondary | ICD-10-CM | POA: Diagnosis not present

## 2019-07-08 DIAGNOSIS — Z8042 Family history of malignant neoplasm of prostate: Secondary | ICD-10-CM | POA: Diagnosis not present

## 2019-07-08 DIAGNOSIS — Z8049 Family history of malignant neoplasm of other genital organs: Secondary | ICD-10-CM | POA: Diagnosis not present

## 2019-07-08 NOTE — Progress Notes (Signed)
REFERRING PROVIDER: No referring provider defined for this encounter.  PRIMARY PROVIDER:  [name], MD  PRIMARY REASON FOR VISIT:  1. Family history of genetic mutation for hereditary nonpolyposis colorectal cancer (HNPCC)   2. Family history of lung cancer   3. Family history of cancer of male genital organ   4. Family history of stomach cancer   5. Family history of pancreatic cancer   6. Family history of prostate cancer   7. ADENOCARCINOMA, PROSTATE   8. Squamous cell skin cancer   9. Melanoma of skin ([place])      HISTORY OF PRESENT ILLNESS:   Darren Edwards, a 85 y.o. male, was seen for a [facility] cancer genetics consultation at the request of Dr. No ref. provider found due to a family history of Lynch syndrome in his daughter, as well as a personal history of prostate and skin cancer and a family history of lung, stomach, pancreatic, prostate, and male reproductive organ cancer.  Darren Edwards presents to clinic today to discuss the possibility of a hereditary predisposition to cancer, genetic testing, and to further clarify his future cancer risks, as well as potential cancer risks for family members.   In 2007, at the age of 51, Darren Edwards was diagnosed with prostate cancer that was treated with surgery. He has also had a melanoma removed at the age of 89, and multiple non-melanoma skin cancers removed, mostly squamous cell carcinomas.  CANCER HISTORY:  Oncology History   No history exists.     RISK FACTORS:  Colonoscopy: yes; 1 polyp in 2011.   Past Medical History:  Diagnosis Date  . Arthritis    Knee  . Cancer ([place])    HX SKIN CANCERS / HX PROSTATE CANCER  . Family history of cancer of male genital organ   . Family history of genetic mutation for hereditary nonpolyposis colorectal cancer (HNPCC)   . Family history of lung cancer   . Family history of pancreatic cancer   . Family history of prostate cancer   . Family history of stomach cancer   . GERD  (gastroesophageal reflux disease)   . GERD (gastroesophageal reflux disease) 10/16/2018  . Hypercholesteremia   . Squamous cell skin cancer 10/16/2018  . 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 COLONOSCOPY  2011   w/Dr. Deatra Ina  . MICROLARYNGOSCOPY W/VOCAL CORD INJECTION Right 07/19/2016   Procedure: MICROLARYNGOSCOPY WITH EXCISION OF RIGHT  VOCAL CORD POLYP;  Surgeon: [name], MD;  Location: [facility];  Service: ENT;  Laterality: Right;  . PARTIAL KNEE ARTHROPLASTY Left 02/12/2016   Procedure: UNICOMPARTMENTAL LEFT KNEE MEDIALLY;  Surgeon: Paralee Cancel, MD;  Location: [facility];  Service: Orthopedics;  Laterality: Left;  . PROSTATECTOMY    . SKIN CANCER EXCISION    . TONSILLECTOMY      Social History   Socioeconomic History  . Marital status: Married    Spouse name: Not on file  . Number of children: 5  . Years of education: Not on file  . Highest education level: Not on file  Occupational History  . Occupation: Retired  Scientific laboratory technician  . Financial resource strain: Not on file  . Food insecurity    Worry: Not on file    Inability: Not on file  . Transportation needs    Medical: Not on file    Non-medical: Not on file  Tobacco Use  .  Smoking status: Current Some Day Smoker    Types: Cigars  . Smokeless tobacco: Never Used  . Tobacco comment: rare- not in a long time  Substance and Sexual Activity  . Alcohol use: Yes    Alcohol/week: 7.0 standard drinks    Types: 7 Glasses of wine per week    Comment: social   . Drug use: No  . Sexual activity: Not on file    Comment: [place]  . Physical activity    Days per week: Not on file    Minutes per session: Not on file  . Stress: Not on file  Relationships  . Social Herbalist on phone: Not on file    Gets together: Not on file    Attends religious service: Not on file    Active member of club or  organization: Not on file    Attends meetings of clubs or organizations: Not on file    Relationship status: Not on file  Other Topics Concern  . Not on file  Social History Narrative  . Not on file     FAMILY HISTORY:  We obtained a detailed, 4-generation family history.  Significant diagnoses are listed below: Family History  Problem Relation Age of Onset  . Cancer Mother 31       "male" cancer  . Lung cancer Father        smoker  . Lung cancer Sister        smoker, diagnosed mid-60s  . Stomach cancer Maternal Uncle        diagnosed early 78s  . Cancer Paternal Aunt        liver or pancreatic cancer, diagnosed mid 50s  . Lung cancer Paternal Uncle 78  . Cancer Paternal Uncle        unknown type, diagnosed late 78s  . Prostate cancer Cousin        diagnosed 61s  . Pancreatic cancer Cousin        diagnosed 106s  . Colon cancer Neg Hx   . Colon polyps Neg Hx   . Diabetes Neg Hx   . Esophageal cancer Neg Hx    Darren Edwards has five children - two sons and three daughters. One daughter, Darren Edwards, has a history of uterine cancer and squamous cell carcinoma and had positive genetic testing for a mutation in MSH6 (Lynch syndrome). Another daughter had melanoma at the age of 43, and one son had multiple squamous cell carcinomas. Darren Edwards has one sister and one brother. His sister had lung cancer diagnosed in her mid-84s and was a smoker.  Darren Edwards mother died from an unknown type of "male" cancer at the age of 51. He had two maternal uncles and one maternal aunt. One uncle had stomach cancer diagnosed in his early 36s. His aunt died in her late 43s or early 55s and it is suspected that she may have had cancer. Darren Edwards maternal grandparents died in their late 30s/early 9s of unknown causes.  Darren Edwards father had lung cancer and died in his 45s, and he was a smoker. There were 15 paternal aunts and uncles altogether. One aunt had liver or pancreatic cancer in her mid 72s,  one uncle had lung cancer diagnosed at age 65 and never smoked, and another uncle died of an unknown cancer in his late 85s. Mr. Failla has a paternal first cousin who had prostate cancer in his 27s, and another cousin who had pancreatic  cancer in his 52s.   Mr. Caban is aware of previous family history of genetic testing for hereditary cancer risks. His ancestors are of [place], [place], and [place] descent. There is no reported Ashkenazi Jewish ancestry. There is no known consanguinity.  GENETIC COUNSELING ASSESSMENT: Darren Edwards is a 85 y.o. male with a family history of Lynch syndrome, a cancer predisposition syndrome. We, therefore, discussed and recommended the following at today's visit.   DISCUSSION: We discussed that, because we know Darren Edwards's daughter has a MSH6 mutation, he has a 50% chance to also have this mutation. His daughter would have likely inherited it from either Darren Edwards or her mother. Mutations in MSH6 are associated with a genetic condition called Lynch syndrome, which causes an increased risk for colon cancer and endometrial cancer, as well as other types of cancers. Additional screening and risk-reduction options have been identified to be appropriate for individuals with Lynch syndrome to manage these risks.   In Darren Edwards's family history, there are other individuals with cancers that may be indicative of a hereditary cancer syndrome. It is possible that this family history, particularly on his mother's side, may be due to Lynch syndrome or a different hereditary cancer syndrome. Identifying hereditary cancer syndromes is important in order to know about other cancer risks, to identify potential screening and risk-reduction options that may be appropriate, and to understand if other family members could be at risk for cancer and allow them to undergo genetic testing.   We reviewed the characteristics, features and inheritance patterns of hereditary cancer syndromes. We also  discussed genetic testing, including the appropriate family members to test, the process of testing, insurance coverage and turn-around-time for results. We discussed the implications of a negative, positive and/or variant of uncertain significant result. We recommended Darren Edwards pursue genetic testing for the [facility] Multi-Cancer gene panel.   The Multi-Cancer Panel offered by [facility] includes sequencing and/or deletion duplication testing of the following 85 genes: AIP, ALK, APC, ATM, AXIN2,BAP1,  BARD1, BLM, BMPR1A, BRCA1, BRCA2, BRIP1, CASR, CDC73, CDH1, CDK4, CDKN1B, CDKN1C, CDKN2A (p14ARF), CDKN2A (p16INK4a), CEBPA, CHEK2, CTNNA1, DICER1, DIS3L2, EGFR (c.2369C>T, p.Thr790Met variant only), EPCAM (Deletion/duplication testing only), FH, FLCN, GATA2, GPC3, GREM1 (Promoter region deletion/duplication testing only), HOXB13 (c.251G>A, p.Gly84Glu), HRAS, KIT, MAX, MEN1, MET, MITF (c.952G>A, p.Glu318Lys variant only), MLH1, MSH2, MSH3, MSH6, MUTYH, NBN, NF1, NF2, NTHL1, PALB2, PDGFRA, PHOX2B, PMS2, POLD1, POLE, POT1, PRKAR1A, PTCH1, PTEN, RAD50, RAD51C, RAD51D, RB1, RECQL4, RET, RNF43, RUNX1, SDHAF2, SDHA (sequence changes only), SDHB, SDHC, SDHD, SMAD4, SMARCA4, SMARCB1, SMARCE1, STK11, SUFU, TERC, TERT, TMEM127, TP53, TSC1, TSC2, VHL, WRN and WT1.    Based on Darren Edwards's family history of cancer, he meets medical criteria for genetic testing. Despite that he meets criteria, he may still have an out of pocket cost. We discussed that if his out of pocket cost for testing is over $100, the laboratory will call and confirm whether he wants to proceed with testing.  If the out of pocket cost of testing is less than $100 he will be billed by the genetic testing laboratory.   PLAN: After considering the risks, benefits, and limitations, Darren Edwards provided informed consent to pursue genetic testing and the blood sample was sent to [facility] for analysis of the Multi-Gene panel. Results should be  available within approximately two-three weeks' time, at which point they will be disclosed by telephone to Mr. Beaupre, as will any additional recommendations warranted by these results. Mr. Dymek will receive a summary  of his genetic counseling visit and a copy of his results once available. This information will also be available in Epic.   Mr. Danford questions were answered to his satisfaction today. Our contact information was provided should additional questions or concerns arise. Thank you for the referral and allowing [place] to share in the care of your patient.   Clint Guy, MS, [facility] Certified Genetic Counselor [email or web address] Phone: [phone]  The patient was seen for a total of 45 minutes in face-to-face genetic counseling.  This patient was discussed with Drs. Magrinat, Lindi Adie and/or Burr Medico who agrees with the above.    _______________________________________________________________________ For Office Staff:  Number of people involved in session: 2 Was an Intern/ student involved with case: no

## 2019-07-09 ENCOUNTER — Encounter: Payer: Self-pay | Admitting: Genetic Counselor

## 2019-07-27 DIAGNOSIS — Z23 Encounter for immunization: Secondary | ICD-10-CM | POA: Diagnosis not present

## 2019-07-28 ENCOUNTER — Telehealth: Payer: Self-pay | Admitting: Genetic Counselor

## 2019-07-28 ENCOUNTER — Encounter: Payer: Self-pay | Admitting: Genetic Counselor

## 2019-07-28 DIAGNOSIS — Z1379 Encounter for other screening for genetic and chromosomal anomalies: Secondary | ICD-10-CM | POA: Insufficient documentation

## 2019-07-28 NOTE — Telephone Encounter (Signed)
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 This means that [name] has Lynch syndrome - a hereditary cancer predisposition syndrome that increases the risk for multiple cancers, including colorectal and endometrial cancer. We will discuss this result and its implications for [name] and his family in more detail during our follow-up appointment tomorrow (07/29/19) at 1pm.

## 2019-07-29 ENCOUNTER — Other Ambulatory Visit: Payer: Self-pay

## 2019-07-29 ENCOUNTER — Inpatient Hospital Stay: Payer: Medicare Other | Attending: Genetic Counselor | Admitting: Genetic Counselor

## 2019-07-29 ENCOUNTER — Encounter: Payer: Self-pay | Admitting: Internal Medicine

## 2019-07-29 DIAGNOSIS — Z8481 Family history of carrier of genetic disease: Secondary | ICD-10-CM

## 2019-07-29 DIAGNOSIS — Z1379 Encounter for other screening for genetic and chromosomal anomalies: Secondary | ICD-10-CM

## 2019-07-29 NOTE — Progress Notes (Signed)
REFERRING PROVIDER: No referring provider defined for this encounter.  PRIMARY PROVIDER:  [name], MD  PRIMARY REASON FOR VISIT:  1. Genetic testing   2. Family history of genetic mutation for hereditary nonpolyposis colorectal cancer (HNPCC)     GENETIC TEST RESULTS   Patient Name: Darren Edwards Patient Age: 85 y.o. Encounter Date: 07/29/2019  HPI: Mr. Lightner was previously seen in the [place] [facility] due to a family of Lynch syndrome and concerns regarding a hereditary predisposition to cancer. Please refer to our prior cancer genetics clinic note for more information regarding Mr. Gersten's medical, social and family histories, and our assessment and recommendations, at the time. Mr. Erazo recent genetic test results were disclosed to him, as were recommendations warranted by these results. These results and recommendations are discussed in more detail below.   FAMILY HISTORY:  We obtained a detailed, 4-generation family history.  Significant diagnoses are listed below: Family History  Problem Relation Age of Onset   Cancer Mother 67       "male" cancer   Lung cancer Father        smoker   Lung cancer Sister        smoker, diagnosed mid-60s   Stomach cancer Maternal Uncle        diagnosed early 17s   Cancer Paternal Aunt        liver or pancreatic cancer, diagnosed mid 39s   Lung cancer Paternal Uncle 47   Cancer Paternal Uncle        unknown type, diagnosed late 33s   Prostate cancer Cousin        diagnosed 55s   Pancreatic cancer Cousin        diagnosed 65s   Colon cancer Neg Hx    Colon polyps Neg Hx    Diabetes Neg Hx    Esophageal cancer Neg Hx      Mr. Hamblin has five children - two sons and three daughters. One daughter, Almyra Free, has a history of uterine cancer and squamous cell carcinoma and had positive genetic testing for a mutation in MSH6 (Lynch syndrome). Another daughter had melanoma at the age of 56, and one  son had multiple squamous cell carcinomas. Mr. Nordin has one sister and one brother. His sister had lung cancer diagnosed in her mid-34s and was a smoker.  Mr. Chalk mother died from an unknown type of "male" cancer at the age of 46. He had two maternal uncles and one maternal aunt. One uncle had stomach cancer diagnosed in his early 39s. His aunt died in her late 73s or early 45s and it is suspected that she may have had cancer. Mr. Hoos maternal grandparents died in their late 30s/early 72s of unknown causes.  Mr. Rosensteel father had lung cancer and died in his 69s, and he was a smoker. There were 15 paternal aunts and uncles altogether. One aunt had liver or pancreatic cancer in her mid 3s, one uncle had lung cancer diagnosed at age 90 and never smoked, and another uncle died of an unknown cancer in his late 96s. Mr. Gehlhausen has a paternal first cousin who had prostate cancer in his 62s, and another cousin who had pancreatic cancer in his 3s.   Mr. Strauch is aware of previous family history of genetic testing for hereditary cancer risks. His ancestors are of [place], [place], and [place] descent. There is no reported Ashkenazi Jewish ancestry. There is no known consanguinity.  GENETIC TESTING:  At the time of Mr. Parkison's visit, we recommended he pursue genetic testing for the specific MSH6 mutation that was identified in the family, in addition to the Multi-Cancer panel offered by [employer]. 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 A copy of the test report will be scanned into Epic for review.    CANCER RISKS & SCREENING RECOMMENDATIONS: We discussed the implications of Lynch syndrome for Mr. Mcanany, and discussed who else in the family should have genetic testing. Individuals with Lynch syndrome have an increased risk for colon  cancer and endometrial cancer, as well as other types of cancers.These cancer risks associated with the MSH6 gene are currently known to include:  Colon cancer: 10-44%  Endometrial cancer: 16-49%  Ovarian cancer: 1-13%  Renal pelvis and/or ureter: 0.7-5.5%  Bladder: 1.0-8.2%  Gastric: <1-7.9%  Small bowel: <1-4%  Biliary tract: 0.2-<1%  Brain: 0.8-1.8%  Additional screening and risk-reduction options have been identified to be appropriate for individuals with Lynch syndrome to manage these risks.  We recommended Mr. Carter consider following management guidelines for Lynch syndrome; all of which are outlined below. These can be coordinated by Mr. Depierro's GI doctor or his primary provider.  Of note, it will be important for Mr. Mercer his physicians to weigh the risks and benefits of additional colonoscopies for Lynch syndrome considering his age. Mr. Salay will plan to discuss this with a GI specialist.   Surveillance/prevention strategies outlined by the NCCN guidelines (v1.2020) for individuals (both men and women) with MSH6-related Lynch syndrome: 1. High-quality colonoscopy every 1-2 y, beginning at age 55-35 or 3-5 y prior to the earliest colon cancer if it is diagnosed before age 58 y. 2. Consider the use of 600 mg/daily of aspirin for at least 2 y to decrease CRC risk. The decision to use aspirin in Lynch syndrome should be made on an individualized basis including a discussion of dose, benefits, and adverse effects.  3. While there is no clear evidence to support screening for stomach and small bowel cancer, an upper endoscopy can be considered at 3-5 year intervals beginning at age 6 y. However, whether to have this screening is best determined by a gastroenterologist.  4. There is no clear evidence to support screening for urothelial cancers in Lynch syndrome, although annual urinalysis starting at age 7-35 y may be considered for those with a family history of urothelial  cancer.  5. Consider annual physical/neurologic examination starting at age 100-30 y to manage brain cancer risk - no additional screening recommendations have been made.  For women with Lynch syndrome, unlike the effective surveillance plan for colorectal cancer risk, there is no professional agreement regarding management for the increased risk of uterine and ovarian cancer. For endometrial cancer, women are encouraged to be aware of and immediately report dysfunctional or post-menopausal bleeding, which should then be followed up by an endometrial biopsy. In terms of surveillance, transvaginal ultrasound and endometrial biopsies have not been shown to be effective screening tools; however, they may be considered at the clinician's discretion. Importantly, transvaginal ultrasound is not recommended in pre-menopausal women due to variable presentations throughout a normal menstrual cycle. Endometrial biopsy can be considered every 1-2 years, but does not have proven benefit of reducing mortality in women with Lynch syndrome given the typically early presenting symptoms. Finally, while hysterectomy has not been shown to reduce endometrial cancer mortality, it does  reduce incidence, and therefore, can be considered as a risk-reducing option.  Unfortunately, symptoms of early stage ovarian cancer are not as obvious as endometrial cancer; however, women are encouraged to be aware of symptoms, such as pelvic or abdominal pain, bloating, increased abdominal girth, difficulty eating, feeling full from eating quickly, as well as increased urinary frequency and urgency. In terms of surveillance, transvaginal ultrasound examination and serum CA-125 have not been shown to be sufficiently sensitive or specific to support for routine screening. In terms of risk-reducing surgery, the decision to undergo a bilateral salpingo-oophorectomy (BSO) should be individualized.  However, we are available to help women and their  providers establish an individualized surveillance plan. It is also important for women to understand the following:  1. Women should seek medical attention if they experience abnormal vaginal bleeding. 2. Some providers may still recommend vaginal ultrasounds, uterine biopsies (for uterine cancer risk) and/or CA-125 analysis (for ovarian cancer risk), even though these have not been shown to be effective. 3. A hysterectomy with removal of the ovaries and fallopian tubes could be considered once childbearing is completed (if planned).  FAMILY MEMBERS: Since we know the mutation in Mr. Dudding, we can test at-risk relatives to determine whether or not they have inherited the mutation and are at increased risk for cancer.  It is important that all of Mr. Schara's relatives (both men and women) know of the presence of this gene mutation. Site-specific genetic testing can sort out who in the family is at risk and who is not. We will be happy to meet with any of the family members or refer them to a genetic counselor in their local area. To locate genetic counselors in other cities, individuals can visit the website of the [employer] (ArtistMovie.se) and Secretary/administrator for a Social worker by zip code.   Mr. Moes children and siblings have a 50% chance to have inherited this mutation. We recommend they have genetic testing for this same mutation, as identifying the presence of this mutation would allow them to also take advantage of risk-reducing measures.   Children who inherit two mutations in the same Lynch gene, one mutation from each parent, are at-risk of a rare recessive condition called constitutional mismatch repair deficiency (CMMR-D) syndrome. If family members have this mutation, they may wish to have their partner tested if they are planning on having children.  PLAN: Mr. Pletz will need to be followed as high risk based on his diagnosis of Lynch syndrome.    We have asked that  Mr. Vanderford be seen by Dr. Bryan Lemma at [facility] for follow up and discussion of appropriate screening options given his diagnosis of Lynch syndrome.  This note will be copied to that practice in order to set up the appropriate follow up.  We strongly encouraged Mr. Macmurray to remain in contact with [place] in cancer genetics on an annual basis so we can update Mr. Buckner's personal and family histories, and inform him of advances in cancer genetics that may be of benefit for the entire family. Mr. Manseau knows he is also welcome to call with any questions or concerns, at any time.    Clint Guy, MS, [facility] Certified Genetic Counselor [email or web address] Phone: [phone]  The patient was seen for a total of 40 minutes in face-to-face genetic counseling.

## 2019-07-30 NOTE — Telephone Encounter (Signed)
Called and spoke with patient- patient is agreeable to be seen by Dr. Bryan Lemma -wishes to be seen in the [facility] office- appt has been made for the patient on 08/13/2019 @ 9:00 am; patient and patient's wife are aware of appt being at [facility] office; Patient verbalized understanding of information/instructions; patient advised to call back to the office should questions/concerns arise;

## 2019-07-30 NOTE — Telephone Encounter (Signed)
-----   Message from Trigg, DO sent at 07/30/2019 10:18 AM EDT ----- Regarding: FW: Lynch syndrome patient Can you please schedule a clinic appointment for this gentleman with me to discuss colonoscopy and Lynch Syndrome.  Thank you. ----- Message ----- From: Clint Guy Sent: [id number]  10:07 AM EDT To: Lavena Bullion, DO Subject: Lynch syndrome patient                         Hi Dr. Bryan Lemma,  After speaking with this patient about his results yesterday, he was interested in having a consultation with you regarding the risks/benefits of screening colonoscopies for Lynch syndrome at 85 years old. Would you be able to have your office schedule a consultation with him?  Thanks, Raquel Sarna

## 2019-08-04 ENCOUNTER — Encounter: Payer: Self-pay | Admitting: Internal Medicine

## 2019-08-04 DIAGNOSIS — Z20822 Contact with and (suspected) exposure to covid-19: Secondary | ICD-10-CM

## 2019-08-04 DIAGNOSIS — Z20828 Contact with and (suspected) exposure to other viral communicable diseases: Secondary | ICD-10-CM

## 2019-08-13 ENCOUNTER — Ambulatory Visit (INDEPENDENT_AMBULATORY_CARE_PROVIDER_SITE_OTHER): Payer: Medicare Other | Admitting: Gastroenterology

## 2019-08-13 ENCOUNTER — Telehealth: Payer: Self-pay

## 2019-08-13 ENCOUNTER — Other Ambulatory Visit: Payer: Self-pay

## 2019-08-13 DIAGNOSIS — Z1509 Genetic susceptibility to other malignant neoplasm: Secondary | ICD-10-CM

## 2019-08-13 NOTE — Progress Notes (Signed)
Chief Complaint: Discuss colon cancer screening, family history of HNPCC, personal history of Lynch mutation  Referring Provider:     Clint Guy  Due to current restrictions/limitations of in-office visits due to the COVID-19 pandemic, this scheduled clinical appointment was converted to a telehealth virtual consultation using D[employer]ity.  -Time of medical discussion: 25 minutes -The patient did consent to this virtual visit and is aware of possible charges through their insurance for this visit.  -Names of all parties present: Darren Edwards (patient), Lorelee New (spuce), Gerrit Heck, DO, [facility] (physician) -Patient location: Home -Physician location: Office  HPI:     Darren Edwards is a 85 y.o. male with a history of squamous cell carcinoma, prostate cancer (age 72), melanoma, vocal cord SCC (resection 2017), GERD, hyperlipidemia, referred to the [facility]ology Clinic for evaluation of colon cancer screening in light of family history of Lynch Syndrome.  He was recently seen in the [facility] due to family history; daughter diagnosed with uterine cancer with MSH6 mutation.  Genetic testing completed in 06/2019 and identified a single, heterogenous pathogenic variant of the MSH6 gene, called [id number], confirming the diagnosis of Lynch syndrome.   He is o/w w/o any complaints today. Patient otherwise denies nausea, vomiting, diarrhea, constipation, hematochezia, melena, night sweats, fever, chills, weight loss, early satiety, dysphagia, odynophagia.  Family history: -Mr. Thebeau has 5 children, 2 sons and 3 daughters -1 daughter with uterine cancer and squamous cell carcinoma, positive MSH6 (Lynch Syndrome) -1 daughter with melanoma -1 son with squamous cell carcinoma -1 sister with lung cancer (smoker) in her 21s -Mother died from unknown (suspected GU) cancer at age 83 -One maternal uncle with stomach cancer -One maternal aunt died in her 38s or  86s with suspected cancer -Maternal grandparents died in their 21s/40s of unknown cause -Father with lung cancer (smoker) died in his 3s -1 paternal aunt with liver or pancreatic cancer in her 2s -One paternal uncle died of unknown cancer -Paternal cousin with prostate cancer -Paternal cousin with pancreatic cancer in his 73s  Endoscopic history: -Colonoscopy (01/2010): 4 mm benign polyp in the ascending colon, internal hemorrhoids -Colonoscopy (11/1999): Sigmoid diverticulosis   Past Medical History:  Diagnosis Date  . Arthritis    Knee  . Cancer ([place])    HX SKIN CANCERS / HX PROSTATE CANCER  . Family history of cancer of male genital organ   . Family history of genetic mutation for hereditary nonpolyposis colorectal cancer (HNPCC)   . Family history of lung cancer   . Family history of pancreatic cancer   . Family history of prostate cancer   . Family history of stomach cancer   . GERD (gastroesophageal reflux disease)   . GERD (gastroesophageal reflux disease) 10/16/2018  . Hypercholesteremia   . Squamous cell skin cancer 10/16/2018  . 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 COLONOSCOPY  2011   w/Dr. Deatra Ina  . MICROLARYNGOSCOPY W/VOCAL CORD INJECTION Right 07/19/2016   Procedure: MICROLARYNGOSCOPY WITH EXCISION OF RIGHT  VOCAL CORD POLYP;  Surgeon: [name], MD;  Location: [place];  Service: ENT;  Laterality: Right;  . PARTIAL KNEE ARTHROPLASTY Left 02/12/2016   Procedure: UNICOMPARTMENTAL LEFT KNEE MEDIALLY;  Surgeon: Paralee Cancel, MD;  Location: [facility];  Service: Orthopedics;  Laterality: Left;  . PROSTATECTOMY    . SKIN CANCER EXCISION    .  TONSILLECTOMY     Family History  Problem Relation Age of Onset  . Cancer Mother 32       "male" cancer  . Lung cancer Father        smoker  . Lung cancer Sister        smoker, diagnosed mid-60s  . Stomach cancer Maternal  Uncle        diagnosed early 76s  . Cancer Paternal Aunt        liver or pancreatic cancer, diagnosed mid 33s  . Lung cancer Paternal Uncle 39  . Cancer Paternal Uncle        unknown type, diagnosed late 24s  . Prostate cancer Cousin        diagnosed 54s  . Pancreatic cancer Cousin        diagnosed 12s  . Colon cancer Neg Hx   . Colon polyps Neg Hx   . Diabetes Neg Hx   . Esophageal cancer Neg Hx    Social History   Tobacco Use  . Smoking status: Current Some Day Smoker    Types: Cigars  . Smokeless tobacco: Never Used  . Tobacco comment: rare- not in a long time  Substance Use Topics  . Alcohol use: Yes    Alcohol/week: 7.0 standard drinks    Types: 7 Glasses of wine per week    Comment: social   . Drug use: No   Current Outpatient Medications  Medication Sig Dispense Refill  . aspirin 81 MG chewable tablet Chew 81 mg by mouth daily.    . cholecalciferol (VITAMIN D) 1000 units tablet Take 1,000 Units by mouth daily.    . clindamycin (CLEOCIN) 300 MG capsule Take 300-900 mg by mouth See admin instructions. Take 300-900 mg by mouth one hour prior to dental procedure    . Coenzyme Q10 (CO Q 10) 100 MG CAPS Take 100 mg by mouth daily.  30 capsule 0  . docusate sodium (COLACE) 100 MG capsule Take 1 capsule (100 mg total) by mouth 2 (two) times daily. (Patient not taking: Reported on 01/21/2019) 10 capsule 0  . ferrous sulfate 325 (65 FE) MG tablet Take 1 tablet (325 mg total) by mouth 3 (three) times daily after meals. (Patient not taking: Reported on 01/21/2019)  3  . HYDROcodone-acetaminophen (NORCO) 7.5-325 MG tablet Take 1-2 tablets by mouth every 4 (four) hours as needed for moderate pain. (Patient not taking: Reported on 01/21/2019) 100 tablet 0  . ibuprofen (ADVIL,MOTRIN) 200 MG tablet Take 200-400 mg by mouth every 8 (eight) hours as needed (for pain or muscle soreness).    . meclizine (ANTIVERT) 12.5 MG tablet Take 1 tablet (12.5 mg total) by mouth 3 (three) times daily as  needed for dizziness. 30 tablet 1  . Multiple Vitamins-Minerals (ONE-A-DAY MENS 50+ ADVANTAGE) TABS Take 1 tablet by mouth daily after breakfast.    . polyethylene glycol (MIRALAX / GLYCOLAX) packet Take 17 g by mouth 2 (two) times daily. (Patient not taking: Reported on 01/21/2019) 14 each 0  . Probiotic Product (ALIGN PO) Take 1 capsule by mouth every morning.     . ranitidine (ZANTAC) 75 MG tablet Take 75 mg by mouth daily before supper.    . simvastatin (ZOCOR) 20 MG tablet Take 20 mg by mouth at bedtime.    Marland Kitchen tiZANidine (ZANAFLEX) 4 MG tablet Take 1 tablet (4 mg total) by mouth every 6 (six) hours as needed for muscle spasms. (Patient not taking: Reported on 01/21/2019) 40 tablet  0   No current facility-administered medications for this visit.    Allergies  Allergen Reactions  . Penicillins Rash    Has patient had a PCN reaction causing immediate rash, facial/tongue/throat swelling, SOB or lightheadedness with hypotension: Yes Has patient had a PCN reaction causing severe rash involving mucus membranes or skin necrosis: No Has patient had a PCN reaction that required hospitalization: No Has patient had a PCN reaction occurring within the last 10 years: No If all of the above answers are "NO", then may proceed with Cephalosporin use.     Review of Systems: All systems reviewed and negative except where noted in HPI.     Physical Exam:    Complete physical exam not completed due to the nature of this telehealth communication.   Gen: Awake, alert, and oriented, and well communicative. Pulm: No labored breathing, speaking in full sentences without conversational  Psych: Pleasant, cooperative, normal speech, thought processing seemingly intact   ASSESSMENT AND PLAN;   1) Lynch Syndrome Mr. Zunker has a strong family history of malignancy, with personal genetic testing consistent with Lynch Syndrome with MSH6 gene mutation.  Discussed cancer screening at length today in individuals  with Lynch Syndrome, to include elevated risks of colon, renal/ureter, bladder, gastric, small bowel, biliary, brain cancers.  -Discussed aspirin use for possible decreased risks in Lynch syndrome -Discussed guideline recommendations for colonoscopy every 1 to 2 years along with upper endoscopy every 3 to 5 years -Normal UA in 12/2018 -Has previously discussed his genetic results with family members with encouragement to seek their own testing through the genetic clinic as well -Yearly follow-up in the [facility]  After much discussion with the patient and his wife, he very strongly wants to proceed with screening studies at this time, to include EGD, colonoscopy.  Provided that work-up is unrevealing, can probably table future GI screening studies.  He did well with vocal cord surgery 2 years ago, to include sedation and recovery.  No cardiopulmonary disease.   No first-degree relatives with pancreatic cancer, so per guidelines, do not need to proceed with pancreatic cancer screening/CT at this time.  The indications, risks, and benefits of EGD and colonoscopy were explained to the patient and his wife in detail.  We had a long, detailed discussion.  Risks include but are not limited to bleeding, perforation, adverse reaction to medications, and cardiopulmonary compromise. Sequelae include but are not limited to the possibility of surgery, hositalization, and mortality. The patient verbalized understanding and wished to proceed. All questions answered, referred to scheduler and bowel prep ordered. Further recommendations pending results of the exam.    Lavena Bullion, DO, FACG  08/13/2019, 8:02 AM   [name], MD

## 2019-08-16 NOTE — Telephone Encounter (Signed)
error 

## 2019-09-30 DIAGNOSIS — L578 Other skin changes due to chronic exposure to nonionizing radiation: Secondary | ICD-10-CM | POA: Diagnosis not present

## 2019-09-30 DIAGNOSIS — L570 Actinic keratosis: Secondary | ICD-10-CM | POA: Diagnosis not present

## 2019-12-08 ENCOUNTER — Telehealth: Payer: Self-pay | Admitting: *Deleted

## 2019-12-08 NOTE — Telephone Encounter (Signed)
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 Updated immunization record.Darren KitchenJohny Chess

## 2019-12-23 DIAGNOSIS — L905 Scar conditions and fibrosis of skin: Secondary | ICD-10-CM | POA: Diagnosis not present

## 2019-12-23 DIAGNOSIS — L821 Other seborrheic keratosis: Secondary | ICD-10-CM | POA: Diagnosis not present

## 2019-12-23 DIAGNOSIS — L819 Disorder of pigmentation, unspecified: Secondary | ICD-10-CM | POA: Diagnosis not present

## 2019-12-23 DIAGNOSIS — Z85828 Personal history of other malignant neoplasm of skin: Secondary | ICD-10-CM | POA: Diagnosis not present

## 2019-12-23 DIAGNOSIS — L570 Actinic keratosis: Secondary | ICD-10-CM | POA: Diagnosis not present

## 2020-01-03 DIAGNOSIS — D1801 Hemangioma of skin and subcutaneous tissue: Secondary | ICD-10-CM | POA: Diagnosis not present

## 2020-01-03 DIAGNOSIS — L578 Other skin changes due to chronic exposure to nonionizing radiation: Secondary | ICD-10-CM | POA: Diagnosis not present

## 2020-01-03 DIAGNOSIS — L570 Actinic keratosis: Secondary | ICD-10-CM | POA: Diagnosis not present

## 2020-01-03 DIAGNOSIS — L821 Other seborrheic keratosis: Secondary | ICD-10-CM | POA: Diagnosis not present

## 2020-01-03 DIAGNOSIS — D485 Neoplasm of uncertain behavior of skin: Secondary | ICD-10-CM | POA: Diagnosis not present

## 2020-01-27 ENCOUNTER — Encounter: Payer: Self-pay | Admitting: Internal Medicine

## 2020-01-27 DIAGNOSIS — C44622 Squamous cell carcinoma of skin of right upper limb, including shoulder: Secondary | ICD-10-CM | POA: Diagnosis not present

## 2020-01-27 DIAGNOSIS — L820 Inflamed seborrheic keratosis: Secondary | ICD-10-CM | POA: Diagnosis not present

## 2020-01-27 DIAGNOSIS — L821 Other seborrheic keratosis: Secondary | ICD-10-CM | POA: Diagnosis not present

## 2020-01-27 DIAGNOSIS — Z85828 Personal history of other malignant neoplasm of skin: Secondary | ICD-10-CM | POA: Diagnosis not present

## 2020-02-21 DIAGNOSIS — Z85828 Personal history of other malignant neoplasm of skin: Secondary | ICD-10-CM | POA: Diagnosis not present

## 2020-02-21 DIAGNOSIS — C44622 Squamous cell carcinoma of skin of right upper limb, including shoulder: Secondary | ICD-10-CM | POA: Diagnosis not present

## 2020-03-09 DIAGNOSIS — Z85828 Personal history of other malignant neoplasm of skin: Secondary | ICD-10-CM | POA: Diagnosis not present

## 2020-03-09 DIAGNOSIS — L570 Actinic keratosis: Secondary | ICD-10-CM | POA: Diagnosis not present

## 2020-03-09 DIAGNOSIS — L821 Other seborrheic keratosis: Secondary | ICD-10-CM | POA: Diagnosis not present

## 2020-04-20 ENCOUNTER — Encounter: Payer: Self-pay | Admitting: Sports Medicine

## 2020-04-20 ENCOUNTER — Ambulatory Visit (INDEPENDENT_AMBULATORY_CARE_PROVIDER_SITE_OTHER): Payer: Medicare Other | Admitting: Sports Medicine

## 2020-04-20 ENCOUNTER — Other Ambulatory Visit: Payer: Self-pay

## 2020-04-20 VITALS — BP 166/63 | Ht 70.0 in | Wt 133.0 lb

## 2020-04-20 DIAGNOSIS — M25562 Pain in left knee: Secondary | ICD-10-CM

## 2020-04-20 NOTE — Progress Notes (Signed)
PCP: [name], MD  Subjective:   HPI: Patient is a 86 y.o. male here for evaluation of left calf pain and left knee pain.  Patient is an avid runner.  Yesterday he was running 40 minutes while alternating between walking 2 minutes and running 2 minutes.  He cut off the track to pick up his water bottle and felt a pull in his left calf muscle.  Patient attempted run through this however he notes that the pain did not not resolve while running.  The pain seemed to migrate over the anterior aspect of his knee by his quad and patellar tendons.  Pain in his calf is slowly started to get better since yesterday.  He denies any bruising.  He denies any numbness or tingling.  He denies any swelling of the knee.  He does have a history of partial left knee replacement approximately 4 years ago.  He has done very well after this until this most recent injury.   Review of Systems: See HPI above.  Past Medical History:  Diagnosis Date  . Arthritis    Knee  . Cancer ([place])    HX SKIN CANCERS / HX PROSTATE CANCER  . Family history of cancer of male genital organ   . Family history of genetic mutation for hereditary nonpolyposis colorectal cancer ([facility])   . Family history of lung cancer   . Family history of pancreatic cancer   . Family history of prostate cancer   . Family history of stomach cancer   . GERD (gastroesophageal reflux disease)   . GERD (gastroesophageal reflux disease) 10/16/2018  . Hypercholesteremia   . Squamous cell skin cancer 10/16/2018  . 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 aspirin 81 MG chewable tablet Chew 81 mg by mouth daily.    . cholecalciferol (VITAMIN D) 1000 units tablet Take 1,000 Units by mouth daily.    . clindamycin (CLEOCIN) 300 MG capsule Take 300-900 mg by mouth See admin instructions. Take 300-900 mg by mouth one hour prior  to dental procedure    . Coenzyme Q10 (CO Q 10) 100 MG CAPS Take 100 mg by mouth daily.  30 capsule 0  . docusate sodium (COLACE) 100 MG capsule Take 1 capsule (100 mg total) by mouth 2 (two) times daily. (Patient not taking: Reported on 01/21/2019) 10 capsule 0  . ferrous sulfate 325 (65 FE) MG tablet Take 1 tablet (325 mg total) by mouth 3 (three) times daily after meals. (Patient not taking: Reported on 01/21/2019)  3  . HYDROcodone-acetaminophen (NORCO) 7.5-325 MG tablet Take 1-2 tablets by mouth every 4 (four) hours as needed for moderate pain. (Patient not taking: Reported on 01/21/2019) 100 tablet 0  . ibuprofen (ADVIL,MOTRIN) 200 MG tablet Take 200-400 mg by mouth every 8 (eight) hours as needed (for pain or muscle soreness).    . Multiple Vitamins-Minerals (ONE-A-DAY MENS 50+ ADVANTAGE) TABS Take 1 tablet by mouth daily after breakfast.    . polyethylene glycol (MIRALAX / GLYCOLAX) packet Take 17 g by mouth 2 (two) times daily. (Patient not taking: Reported on 01/21/2019) 14 each 0  . Probiotic Product (ALIGN PO) Take 1 capsule by mouth every morning.     . ranitidine (ZANTAC) 75 MG tablet Take 75 mg by mouth daily before supper.    . simvastatin (ZOCOR) 20 MG tablet Take 20 mg  by mouth at bedtime.    Marland Kitchen tiZANidine (ZANAFLEX) 4 MG tablet Take 1 tablet (4 mg total) by mouth every 6 (six) hours as needed for muscle spasms. (Patient not taking: Reported on 01/21/2019) 40 tablet 0   No current facility-administered medications on file prior to visit.    Past Surgical History:  Procedure Laterality Date  . COLONOSCOPY  2011   w/Dr. Deatra Ina  . MICROLARYNGOSCOPY W/VOCAL CORD INJECTION Right 07/19/2016   Procedure: MICROLARYNGOSCOPY WITH EXCISION OF RIGHT  VOCAL CORD POLYP;  Surgeon: [name], MD;  Location: [facility];  Service: ENT;  Laterality: Right;  . PARTIAL KNEE ARTHROPLASTY Left 02/12/2016   Procedure: UNICOMPARTMENTAL LEFT KNEE MEDIALLY;  Surgeon: Paralee Cancel, MD;  Location: [facility];   Service: Orthopedics;  Laterality: Left;  . PROSTATECTOMY    . SKIN CANCER EXCISION    . TONSILLECTOMY      Allergies  Allergen Reactions  . Penicillins Rash    Has patient had a PCN reaction causing immediate rash, facial/tongue/throat swelling, SOB or lightheadedness with hypotension: Yes Has patient had a PCN reaction causing severe rash involving mucus membranes or skin necrosis: No Has patient had a PCN reaction that required hospitalization: No Has patient had a PCN reaction occurring within the last 10 years: No If all of the above answers are "NO", then may proceed with Cephalosporin use.    Social History   Socioeconomic History  . Marital status: Married    Spouse name: Not on file  . Number of children: 5  . Years of education: Not on file  . Highest education level: Not on file  Occupational History  . Occupation: Retired  Tobacco Use  . Smoking status: Current Some Day Smoker    Types: Cigars  . Smokeless tobacco: Never Used  . Tobacco comment: rare- not in a long time  Substance and Sexual Activity  . Alcohol use: Yes    Alcohol/week: 7.0 standard drinks    Types: 7 Glasses of wine per week    Comment: social   . Drug use: No  . Sexual activity: Not on file    Comment: OCCASIONAL CIGAR  Other Topics Concern  . Not on file  Social History Narrative  . Not on file   Social Determinants of Health   Financial Resource Strain:   . Difficulty of Paying Living Expenses:   Food Insecurity:   . Worried About Charity fundraiser in the Last Year:   . Arboriculturist in the Last Year:   Transportation Needs:   . Film/video editor (Medical):   Marland Kitchen Lack of Transportation (Non-Medical):   Physical Activity:   . Days of Exercise per Week:   . Minutes of Exercise per Session:   Stress:   . Feeling of Stress :   Social Connections:   . Frequency of Communication with Friends and Family:   . Frequency of Social Gatherings with Friends and Family:   .  Attends Religious Services:   . Active Member of Clubs or Organizations:   . Attends Archivist Meetings:   Marland Kitchen Marital Status:   Intimate Partner Violence:   . Fear of Current or Ex-Partner:   . Emotionally Abused:   Marland Kitchen Physically Abused:   . Sexually Abused:     Family History  Problem Relation Age of Onset  . Cancer Mother 48       "male" cancer  . Lung cancer Father  smoker  . Lung cancer Sister        smoker, diagnosed mid-60s  . Stomach cancer Maternal Uncle        diagnosed early 74s  . Cancer Paternal Aunt        liver or pancreatic cancer, diagnosed mid 47s  . Lung cancer Paternal Uncle 52  . Cancer Paternal Uncle        unknown type, diagnosed late 7s  . Prostate cancer Cousin        diagnosed 62s  . Pancreatic cancer Cousin        diagnosed 77s  . Colon cancer Neg Hx   . Colon polyps Neg Hx   . Diabetes Neg Hx   . Esophageal cancer Neg Hx         Objective:  Physical Exam: BP (!) 166/63   Ht 5\' 10"  (1.778 m)   Wt 133 lb (60.3 kg)   BMI 19.08 kg/m   Gen: NAD, comfortable in exam room Lungs: Breathing comfortably on room air Knee Exam Left -Inspection: Surgical scar over the anterior knee from previous joint replacement. -Palpation: No tenderness palpation -ROM: Extension: 0 degrees; Flexion: 130 degrees -Strength: Extension: 5/5; Flexion: 5/5 -Special Tests: Varus Stress: Negative; Valgus Stress: Negative -Limb neurovascularly intact, no instability noted  Left calf: -Inspection: No bruising -No TTP -Normal ROM at the ankle -5/5 strength with ankle plantarflexion and dorsiflexion  Limited diagnostic ultrasound of the left knee Findings: -Normal appearance of the quadriceps and patellar tendons -Moderate-sized joint effusion noted within the suprapatellar pouch -Soft tissue edema noted at the medial joint line -Normal appearance of the lateral meniscus.  There was minimal fluid noted at the lateral joint line as  well Impression: -Ultrasound findings showing moderate-sized joint effusion  Limited diagnostic ultrasound of the left calf Findings: -Edema noted within the distal medial gastroc consistent with a muscle strain.  No discrete muscle tear was seen Impression: -Ultrasound showing strain of the medial gastroc  Ultrasound and interpretation by Dr. Sheppard Coil and Wolfgang Phoenix. Fields, MD    Assessment & Plan:  Patient is a 86 y.o. male here for left knee and calf pain  1. Left knee pain with effusion likely 2/2 contusion -Ultrasound showing moderate-sized effusion.  Patient likely developed the effusion from a bone contusion -Patient given knee compression sleeve to wear with activity -Patient vies to not run for the next month.  He is okay to walk and wear the compression sleeve while he does this -Patient may also cross train while using a stationary bike -Patient to continue Strengthening exercises for the calf strain he developed during the run as well  Patient will follow up in 1 month if his knee pain is not improved  I observed and examined the patient with Dr. Sheppard Coil and agree with assessment and plan.  Note reviewed and modified by me. Ila Mcgill, MD

## 2020-04-20 NOTE — Patient Instructions (Signed)
On the ultrasound of your knee we saw swelling and fluid within your knee.  This is likely from a contusion or bruise that happened when he twisted your knee. -Wear the knee compression sleeve with activity over the next month to help improve the swelling -We would advise against running over the next month.  You are okay to walk but wear the knee compression sleeve while walking -Work on your calf strengthening exercises that you have already been doing at home to help with your calf strain  We will see back in 1 month if your pain is not improved by then

## 2020-05-12 ENCOUNTER — Other Ambulatory Visit: Payer: Self-pay

## 2020-05-12 ENCOUNTER — Ambulatory Visit (INDEPENDENT_AMBULATORY_CARE_PROVIDER_SITE_OTHER): Payer: Medicare Other | Admitting: Family Medicine

## 2020-05-12 ENCOUNTER — Encounter: Payer: Self-pay | Admitting: Family Medicine

## 2020-05-12 VITALS — BP 140/82 | Ht 68.0 in | Wt 135.0 lb

## 2020-05-12 DIAGNOSIS — S76011A Strain of muscle, fascia and tendon of right hip, initial encounter: Secondary | ICD-10-CM | POA: Diagnosis not present

## 2020-05-12 DIAGNOSIS — M25551 Pain in right hip: Secondary | ICD-10-CM | POA: Diagnosis not present

## 2020-05-12 DIAGNOSIS — M25562 Pain in left knee: Secondary | ICD-10-CM

## 2020-05-12 NOTE — Assessment & Plan Note (Signed)
Resolved pain

## 2020-05-12 NOTE — Assessment & Plan Note (Signed)
I do think this is muscle strain.  I see no sign of bony defect, there is no sign of bursitis. I gave him a HEP for hip strength altho he is already pretty strong/ I also gave him small (3/16 in) heel lit to try and use prn.

## 2020-05-12 NOTE — Progress Notes (Signed)
  Darren Edwards - 86 y.o. male MRN [id number]  Date of birth: 1929/06/07    SUBJECTIVE:      Chief Complaint:/ HPI:    #1.  Right buttock pain.  Last few weeks she has had a twinge in the right posterior buttock with running.  If he turns a certain way it will really kind of grab.  Does not bother him when he is just walking or sitting or standing.  He runs 3 times a week. He used to have a lift in his shoe and wonders if that would help and she is concerned about his leg length discrepancy. #2.  Follow-up left knee pain.  At the same time he injured his hip, he injured his knee and was seen here for that.  It has totally resolved.   OBJECTIVE: BP 140/82   Ht 5\' 8"  (1.727 m)   Wt 135 lb (61.2 kg)   BMI 20.53 kg/m   Physical Exam:  Vital signs are reviewed. GENERAL: Well-developed male no acute distress Hips: Internal and external rotation bilaterally is full and painless.  He has normal hip flexor and extensor strength.  He has normal abduction and abduction strength bilaterally.  No gluteus medius weakness. KNEES: 5 out of 5 flexion extension.  No effusion. Leg length: Left 88.5 cm Right: 91 cm.  ULTRASOUND: Right gluteal area:Gluteal muscles are without obvious defect, there is no noted fluid collection. The ishium is without defect. The origin of the hamstring muscles reveals no defect.  ASSESSMENT & PLAN:  See problem based charting & AVS for pt instructions. No problem-specific Assessment & Plan notes found for this encounter.

## 2020-05-16 DIAGNOSIS — L821 Other seborrheic keratosis: Secondary | ICD-10-CM | POA: Diagnosis not present

## 2020-05-16 DIAGNOSIS — D485 Neoplasm of uncertain behavior of skin: Secondary | ICD-10-CM | POA: Diagnosis not present

## 2020-05-16 DIAGNOSIS — L570 Actinic keratosis: Secondary | ICD-10-CM | POA: Diagnosis not present

## 2020-05-16 DIAGNOSIS — C44622 Squamous cell carcinoma of skin of right upper limb, including shoulder: Secondary | ICD-10-CM | POA: Diagnosis not present

## 2020-05-16 DIAGNOSIS — Z85828 Personal history of other malignant neoplasm of skin: Secondary | ICD-10-CM | POA: Diagnosis not present

## 2020-05-30 ENCOUNTER — Encounter: Payer: Self-pay | Admitting: Internal Medicine

## 2020-06-19 DIAGNOSIS — L570 Actinic keratosis: Secondary | ICD-10-CM | POA: Diagnosis not present

## 2020-06-19 DIAGNOSIS — L988 Other specified disorders of the skin and subcutaneous tissue: Secondary | ICD-10-CM | POA: Diagnosis not present

## 2020-06-19 DIAGNOSIS — C44622 Squamous cell carcinoma of skin of right upper limb, including shoulder: Secondary | ICD-10-CM | POA: Diagnosis not present

## 2020-07-10 DIAGNOSIS — Z23 Encounter for immunization: Secondary | ICD-10-CM | POA: Diagnosis not present

## 2020-07-22 DIAGNOSIS — Z23 Encounter for immunization: Secondary | ICD-10-CM | POA: Diagnosis not present

## 2020-08-01 DIAGNOSIS — Z85828 Personal history of other malignant neoplasm of skin: Secondary | ICD-10-CM | POA: Diagnosis not present

## 2020-08-01 DIAGNOSIS — L570 Actinic keratosis: Secondary | ICD-10-CM | POA: Diagnosis not present

## 2020-08-01 DIAGNOSIS — L820 Inflamed seborrheic keratosis: Secondary | ICD-10-CM | POA: Diagnosis not present

## 2020-08-01 DIAGNOSIS — D485 Neoplasm of uncertain behavior of skin: Secondary | ICD-10-CM | POA: Diagnosis not present

## 2020-08-31 ENCOUNTER — Encounter: Payer: Self-pay | Admitting: Internal Medicine

## 2020-09-11 DIAGNOSIS — D0462 Carcinoma in situ of skin of left upper limb, including shoulder: Secondary | ICD-10-CM | POA: Diagnosis not present

## 2020-09-11 DIAGNOSIS — C44629 Squamous cell carcinoma of skin of left upper limb, including shoulder: Secondary | ICD-10-CM | POA: Diagnosis not present

## 2020-09-11 DIAGNOSIS — Z85828 Personal history of other malignant neoplasm of skin: Secondary | ICD-10-CM | POA: Diagnosis not present

## 2020-09-11 DIAGNOSIS — L821 Other seborrheic keratosis: Secondary | ICD-10-CM | POA: Diagnosis not present

## 2020-09-11 DIAGNOSIS — L570 Actinic keratosis: Secondary | ICD-10-CM | POA: Diagnosis not present

## 2020-09-11 DIAGNOSIS — L578 Other skin changes due to chronic exposure to nonionizing radiation: Secondary | ICD-10-CM | POA: Diagnosis not present

## 2020-09-11 DIAGNOSIS — L820 Inflamed seborrheic keratosis: Secondary | ICD-10-CM | POA: Diagnosis not present

## 2020-09-28 DIAGNOSIS — C44629 Squamous cell carcinoma of skin of left upper limb, including shoulder: Secondary | ICD-10-CM | POA: Diagnosis not present

## 2020-09-28 DIAGNOSIS — L570 Actinic keratosis: Secondary | ICD-10-CM | POA: Diagnosis not present

## 2020-09-28 DIAGNOSIS — D0462 Carcinoma in situ of skin of left upper limb, including shoulder: Secondary | ICD-10-CM | POA: Diagnosis not present

## 2020-09-28 DIAGNOSIS — Z85828 Personal history of other malignant neoplasm of skin: Secondary | ICD-10-CM | POA: Diagnosis not present

## 2020-09-28 DIAGNOSIS — L821 Other seborrheic keratosis: Secondary | ICD-10-CM | POA: Diagnosis not present

## 2020-10-16 ENCOUNTER — Encounter: Payer: Self-pay | Admitting: Internal Medicine

## 2020-12-19 DIAGNOSIS — H2513 Age-related nuclear cataract, bilateral: Secondary | ICD-10-CM | POA: Diagnosis not present

## 2020-12-19 DIAGNOSIS — H43811 Vitreous degeneration, right eye: Secondary | ICD-10-CM | POA: Diagnosis not present

## 2020-12-19 DIAGNOSIS — H02831 Dermatochalasis of right upper eyelid: Secondary | ICD-10-CM | POA: Diagnosis not present

## 2020-12-19 DIAGNOSIS — H02834 Dermatochalasis of left upper eyelid: Secondary | ICD-10-CM | POA: Diagnosis not present

## 2020-12-26 DIAGNOSIS — C320 Malignant neoplasm of glottis: Secondary | ICD-10-CM | POA: Diagnosis not present

## 2020-12-26 DIAGNOSIS — K219 Gastro-esophageal reflux disease without esophagitis: Secondary | ICD-10-CM | POA: Diagnosis not present

## 2020-12-26 DIAGNOSIS — R490 Dysphonia: Secondary | ICD-10-CM | POA: Diagnosis not present

## 2021-01-29 DIAGNOSIS — Z23 Encounter for immunization: Secondary | ICD-10-CM | POA: Diagnosis not present

## 2021-02-14 ENCOUNTER — Other Ambulatory Visit: Payer: Self-pay

## 2021-02-14 ENCOUNTER — Encounter: Payer: Self-pay | Admitting: Family Medicine

## 2021-02-14 ENCOUNTER — Ambulatory Visit (INDEPENDENT_AMBULATORY_CARE_PROVIDER_SITE_OTHER): Payer: Medicare Other | Admitting: Family Medicine

## 2021-02-14 ENCOUNTER — Ambulatory Visit: Payer: Self-pay

## 2021-02-14 VITALS — BP 120/70 | Ht 69.0 in | Wt 133.0 lb

## 2021-02-14 DIAGNOSIS — R0789 Other chest pain: Secondary | ICD-10-CM

## 2021-02-14 DIAGNOSIS — S29011A Strain of muscle and tendon of front wall of thorax, initial encounter: Secondary | ICD-10-CM

## 2021-02-14 NOTE — Progress Notes (Addendum)
   PCP: [name], MD  Subjective:   HPI: Patient is a 87 y.o. male here for evaluation of left sided chest/chest wall pain.  He reports that about a week ago, while he was doing some push-ups, he noted that he started having pain in the lateral aspect of his chest.  He is very active man who does about 100 push-ups per day, sit ups, and rides his bike most days of the week.  He is historically an avid runner but has not been able to run due to knee pain the last few years.  He reports that he has pain in his lateral aspect of his chest/inferior aspect of pectoralis muscle while he is doing push-ups and sometimes while he is doing sit ups as well.  He is not have any chest pain when he is doing lower extremity activities and he does not have any chest pain with exertion on his bike.  No cardiac history.  He does not have any pain at rest, only when he is doing specific activities that are activating his core/pectoralis area.  No nausea, vomiting, diaphoresis.  No fall, trauma or injury.  He has been doing a lot of yard work lately which is a new activity for him.  Otherwise no new activities.   Review of Systems:  Per HPI.   [place], medications and smoking status reviewed.      Objective:  Physical Exam:  No flowsheet data found.   Gen: awake, alert, NAD, comfortable in exam room Pulm: breathing unlabored  Left lateral chest: -Inspection: No obvious deformities, pectoralis well-visualized and anterior axillary fold with normal contour.  No significant ecchymoses, erythema or edema. -Palpation: Tenderness to palpation on the inferior aspect of the pectoralis musculature and some mild tenderness to palpation of the intercostal muscles in this area as well. -ROM: Full range of motion of the left arm and torso without pain -Strength: 5/5 strength with all planes of the shoulder, 5/5 strength with pectoralis testing bilaterally -Neurovascular intact in bilateral upper  extremities  Limited US examination of left chest wall: -Ribs were well visualized in long and short axis in the area of pain and do not show any signs of fracture or abnormality -Pectoralis muscle visualized and followed distally to its insertion on the humerus without any significant abnormalities aside from a small intramuscular tear near the insertion onto the anterolateral aspect of the rib. -Intercostal muscles visualized and do not show any significant abnormalities  Impression: -Small intramuscular tear of the pectoralis muscle at its insertion on the rib  Ultrasound performed and interpreted by Dagoberto Ligas, MD and Yvetta Coder, DO   Assessment & Plan:  1.  Pectoralis major muscle strain Small muscle strain seen on ultrasound and exam is consistent with this.  Also considered cardiac etiology however he has history of having no pain with aerobic exertion on his bike is not consistent with this and the reproducible pain with palpation is reassuring as well.   Plan: -Rest from aggravating activities (push-ups, sit ups) -After about 2 weeks, can start reintroducing push-ups, starting with incline push-ups and progressing to flat push-ups -Follow-up as needed if not improved over the next 2 to 3 weeks   Dagoberto Ligas, MD [place] Fellow 02/14/2021 1:40 PM   I was the preceptor for this visit and available for immediate consultation Shellia Cleverly, DO

## 2021-02-15 DIAGNOSIS — Z85828 Personal history of other malignant neoplasm of skin: Secondary | ICD-10-CM | POA: Diagnosis not present

## 2021-02-15 DIAGNOSIS — L570 Actinic keratosis: Secondary | ICD-10-CM | POA: Diagnosis not present

## 2021-02-15 DIAGNOSIS — L821 Other seborrheic keratosis: Secondary | ICD-10-CM | POA: Diagnosis not present

## 2021-02-15 DIAGNOSIS — B078 Other viral warts: Secondary | ICD-10-CM | POA: Diagnosis not present

## 2021-05-07 DIAGNOSIS — C44519 Basal cell carcinoma of skin of other part of trunk: Secondary | ICD-10-CM | POA: Diagnosis not present

## 2021-05-07 DIAGNOSIS — Z85828 Personal history of other malignant neoplasm of skin: Secondary | ICD-10-CM | POA: Diagnosis not present

## 2021-05-07 DIAGNOSIS — L570 Actinic keratosis: Secondary | ICD-10-CM | POA: Diagnosis not present

## 2021-07-19 DIAGNOSIS — Z23 Encounter for immunization: Secondary | ICD-10-CM | POA: Diagnosis not present

## 2021-08-21 DIAGNOSIS — L570 Actinic keratosis: Secondary | ICD-10-CM | POA: Diagnosis not present

## 2021-08-21 DIAGNOSIS — C44622 Squamous cell carcinoma of skin of right upper limb, including shoulder: Secondary | ICD-10-CM | POA: Diagnosis not present

## 2021-08-21 DIAGNOSIS — Z85828 Personal history of other malignant neoplasm of skin: Secondary | ICD-10-CM | POA: Diagnosis not present

## 2021-08-21 DIAGNOSIS — L820 Inflamed seborrheic keratosis: Secondary | ICD-10-CM | POA: Diagnosis not present

## 2021-11-01 ENCOUNTER — Telehealth: Payer: Self-pay | Admitting: Internal Medicine

## 2021-11-01 DIAGNOSIS — J029 Acute pharyngitis, unspecified: Secondary | ICD-10-CM | POA: Diagnosis not present

## 2021-11-01 NOTE — Telephone Encounter (Signed)
Patient's spouse states patient has had a sore throat since 10-28-2021  Caller states patient is fatigue, but has no other symptoms.  Caller is requesting

## 2021-11-04 DIAGNOSIS — Z20822 Contact with and (suspected) exposure to covid-19: Secondary | ICD-10-CM | POA: Diagnosis not present

## 2021-11-05 NOTE — Telephone Encounter (Signed)
Connected to [facility] 1.8.2023.   Caller's husband tested negative for covid on Thursday. He took another test and it is positive for covid today. Dizziness, weakness. Sore throat. Can he get the antiviral called in? [facility] [phone].

## 2021-11-05 NOTE — Telephone Encounter (Signed)
Patient is scheduled with [facility] 11/06/21

## 2021-11-06 ENCOUNTER — Telehealth (INDEPENDENT_AMBULATORY_CARE_PROVIDER_SITE_OTHER): Payer: Medicare Other | Admitting: Physician Assistant

## 2021-11-06 VITALS — BP 126/71 | Temp 97.7°F | Ht 69.0 in

## 2021-11-06 DIAGNOSIS — U071 COVID-19: Secondary | ICD-10-CM

## 2021-11-06 NOTE — Progress Notes (Signed)
Virtual Visit via Video Note  I connected with  Darren Edwards  on 11/06/21 at 10:00 AM EST by a video enabled telemedicine application and verified that I am speaking with the correct person using two identifiers.  Location: Patient: home Provider: Therapist, music at [place] present: Patient, patient's wife, Leda Gauze, and myself   I discussed the limitations of evaluation and management by telemedicine and the availability of in person appointments. The patient expressed understanding and agreed to proceed.   History of Present Illness:  Chief complaint: Fatigue 2/2 COVID-19  Symptom onset: 10/29/2021 Pertinent positives: ST (improved since last week), hoarseness Pertinent negatives: CP, SOB, n/v/d, dec urination, dark urine, headache, cough, nasal congestion Treatments tried: Zinc, Vit D, Vit C, fluids, gargling with saltwater  Vaccine status: Up to date on all COVID-19 vaccines  Sick exposure: Grandson recently positive over the holidays for COVID-19 and granddaughter with strep pharyngitis   Urgent care visit on 11/02/2021 - rapid strep A and COVID-19 tests were negative.  Positive home COVID-19 test on 11/04/2021 Still having good appetite, just less energy. Taking fluids well.     Observations/Objective:   Gen: Awake, alert, no acute distress Resp: Breathing is even and non-labored Psych: calm/pleasant demeanor Neuro: Alert and Oriented x 3, + facial symmetry, speech is clear.   Assessment and Plan:  1. COVID-19 Diagnosis confirmed via home antigen test.  Patient is currently having mild symptoms.  We discussed current algorithm recommendations for prescribing outpatient antivirals.  As the patient is past the first 5 day window of symptom onset, antiviral treatment is not indicated at this time.  Risks versus benefits discussed.  Advised self-isolation at home for the next 5 days and then masking around others for at least an additional 5 days.  Treat  supportively at this time including sleeping prone, deep breathing exercises, pushing fluids, walking every few hours, vitamins C and D, and Tylenol or ibuprofen as needed.  The patient understands that COVID-19 illness can wax and wane.  Should the symptoms acutely worsen or patient starts to experience sudden shortness of breath, chest pain, severe weakness, the patient will go straight to the emergency department.  Also advised home pulse oximetry monitoring and for any reading consistently under 92%, should also report to the emergency department.  The patient will continue to keep [place] updated.    Follow Up Instructions:    I discussed the assessment and treatment plan with the patient. The patient was provided an opportunity to ask questions and all were answered. The patient agreed with the plan and demonstrated an understanding of the instructions.   The patient was advised to call back or seek an in-person evaluation if the symptoms worsen or if the condition fails to improve as anticipated.  Juliet Vasbinder M Marry Kusch, PA-C

## 2021-11-08 ENCOUNTER — Ambulatory Visit: Payer: Medicare Other | Admitting: Internal Medicine

## 2021-11-08 ENCOUNTER — Telehealth: Payer: Self-pay

## 2021-11-08 NOTE — Telephone Encounter (Signed)
Spouse called in for patient.    States [facility] had asked for them to call him to give report on BP.  States patient is use to BP running in the 120's/ 60's.  States BP has been running higher.  States BP right now is 139/92.  States BP has been running around this or 165/98.  States patient has felt dizzy but denies patient having any symptoms right now.   Spouse is requesting call back in regard.

## 2021-11-09 ENCOUNTER — Encounter: Payer: Self-pay | Admitting: Physician Assistant

## 2021-11-09 NOTE — Telephone Encounter (Signed)
See MyChart Message - BP back to normal and pt doing well. Will continue to stay in contact about this and pt to message with updates.

## 2021-11-19 DIAGNOSIS — Z85828 Personal history of other malignant neoplasm of skin: Secondary | ICD-10-CM | POA: Diagnosis not present

## 2021-11-19 DIAGNOSIS — L570 Actinic keratosis: Secondary | ICD-10-CM | POA: Diagnosis not present

## 2021-11-19 DIAGNOSIS — L821 Other seborrheic keratosis: Secondary | ICD-10-CM | POA: Diagnosis not present

## 2021-11-19 DIAGNOSIS — C44722 Squamous cell carcinoma of skin of right lower limb, including hip: Secondary | ICD-10-CM | POA: Diagnosis not present

## 2021-11-20 DIAGNOSIS — H02831 Dermatochalasis of right upper eyelid: Secondary | ICD-10-CM | POA: Diagnosis not present

## 2021-11-20 DIAGNOSIS — H43811 Vitreous degeneration, right eye: Secondary | ICD-10-CM | POA: Diagnosis not present

## 2021-11-20 DIAGNOSIS — H2513 Age-related nuclear cataract, bilateral: Secondary | ICD-10-CM | POA: Diagnosis not present

## 2021-11-20 DIAGNOSIS — H00025 Hordeolum internum left lower eyelid: Secondary | ICD-10-CM | POA: Diagnosis not present

## 2021-11-20 DIAGNOSIS — H02834 Dermatochalasis of left upper eyelid: Secondary | ICD-10-CM | POA: Diagnosis not present

## 2021-12-04 DIAGNOSIS — H2513 Age-related nuclear cataract, bilateral: Secondary | ICD-10-CM | POA: Diagnosis not present

## 2021-12-04 DIAGNOSIS — H02834 Dermatochalasis of left upper eyelid: Secondary | ICD-10-CM | POA: Diagnosis not present

## 2021-12-04 DIAGNOSIS — H02831 Dermatochalasis of right upper eyelid: Secondary | ICD-10-CM | POA: Diagnosis not present

## 2021-12-04 DIAGNOSIS — H43811 Vitreous degeneration, right eye: Secondary | ICD-10-CM | POA: Diagnosis not present

## 2021-12-04 DIAGNOSIS — H00025 Hordeolum internum left lower eyelid: Secondary | ICD-10-CM | POA: Diagnosis not present

## 2022-02-05 DIAGNOSIS — H938X1 Other specified disorders of right ear: Secondary | ICD-10-CM | POA: Insufficient documentation

## 2022-02-05 DIAGNOSIS — K219 Gastro-esophageal reflux disease without esophagitis: Secondary | ICD-10-CM | POA: Diagnosis not present

## 2022-02-05 DIAGNOSIS — R490 Dysphonia: Secondary | ICD-10-CM | POA: Diagnosis not present

## 2022-02-12 DIAGNOSIS — H0015 Chalazion left lower eyelid: Secondary | ICD-10-CM | POA: Diagnosis not present

## 2022-02-12 DIAGNOSIS — H2513 Age-related nuclear cataract, bilateral: Secondary | ICD-10-CM | POA: Diagnosis not present

## 2022-02-12 DIAGNOSIS — H02831 Dermatochalasis of right upper eyelid: Secondary | ICD-10-CM | POA: Diagnosis not present

## 2022-02-12 DIAGNOSIS — H02834 Dermatochalasis of left upper eyelid: Secondary | ICD-10-CM | POA: Diagnosis not present

## 2022-02-12 DIAGNOSIS — H43811 Vitreous degeneration, right eye: Secondary | ICD-10-CM | POA: Diagnosis not present

## 2022-03-12 DIAGNOSIS — D1801 Hemangioma of skin and subcutaneous tissue: Secondary | ICD-10-CM | POA: Diagnosis not present

## 2022-03-12 DIAGNOSIS — L821 Other seborrheic keratosis: Secondary | ICD-10-CM | POA: Diagnosis not present

## 2022-03-12 DIAGNOSIS — L570 Actinic keratosis: Secondary | ICD-10-CM | POA: Diagnosis not present

## 2022-03-12 DIAGNOSIS — L820 Inflamed seborrheic keratosis: Secondary | ICD-10-CM | POA: Diagnosis not present

## 2022-03-12 DIAGNOSIS — Z85828 Personal history of other malignant neoplasm of skin: Secondary | ICD-10-CM | POA: Diagnosis not present

## 2022-03-12 DIAGNOSIS — L720 Epidermal cyst: Secondary | ICD-10-CM | POA: Diagnosis not present

## 2022-04-01 ENCOUNTER — Encounter: Payer: Self-pay | Admitting: Internal Medicine

## 2022-04-02 ENCOUNTER — Ambulatory Visit: Payer: Medicare Other | Admitting: Internal Medicine

## 2022-04-16 ENCOUNTER — Ambulatory Visit (INDEPENDENT_AMBULATORY_CARE_PROVIDER_SITE_OTHER): Payer: Medicare Other | Admitting: Internal Medicine

## 2022-04-16 ENCOUNTER — Encounter: Payer: Self-pay | Admitting: Internal Medicine

## 2022-04-16 VITALS — BP 120/70 | HR 40 | Ht 69.0 in | Wt 134.0 lb

## 2022-04-16 DIAGNOSIS — I10 Essential (primary) hypertension: Secondary | ICD-10-CM

## 2022-04-16 DIAGNOSIS — E785 Hyperlipidemia, unspecified: Secondary | ICD-10-CM | POA: Insufficient documentation

## 2022-04-16 DIAGNOSIS — J309 Allergic rhinitis, unspecified: Secondary | ICD-10-CM | POA: Diagnosis not present

## 2022-04-16 DIAGNOSIS — L570 Actinic keratosis: Secondary | ICD-10-CM | POA: Insufficient documentation

## 2022-04-16 DIAGNOSIS — E538 Deficiency of other specified B group vitamins: Secondary | ICD-10-CM

## 2022-04-16 DIAGNOSIS — Z461 Encounter for fitting and adjustment of hearing aid: Secondary | ICD-10-CM | POA: Insufficient documentation

## 2022-04-16 DIAGNOSIS — E559 Vitamin D deficiency, unspecified: Secondary | ICD-10-CM | POA: Diagnosis not present

## 2022-04-16 DIAGNOSIS — Z85820 Personal history of malignant melanoma of skin: Secondary | ICD-10-CM | POA: Insufficient documentation

## 2022-04-16 DIAGNOSIS — R739 Hyperglycemia, unspecified: Secondary | ICD-10-CM | POA: Diagnosis not present

## 2022-04-16 DIAGNOSIS — E7800 Pure hypercholesterolemia, unspecified: Secondary | ICD-10-CM

## 2022-04-16 LAB — CBC WITH DIFFERENTIAL/PLATELET
Basophils Absolute: 0.1 10*3/uL (ref 0.0–0.1)
Basophils Relative: 1.2 % (ref 0.0–3.0)
Eosinophils Absolute: 0.1 10*3/uL (ref 0.0–0.7)
Eosinophils Relative: 2 % (ref 0.0–5.0)
HCT: 39.4 % (ref 39.0–52.0)
Hemoglobin: 13.3 g/dL (ref 13.0–17.0)
Lymphocytes Relative: 28.6 % (ref 12.0–46.0)
Lymphs Abs: 1.4 10*3/uL (ref 0.7–4.0)
MCHC: 33.7 g/dL (ref 30.0–36.0)
MCV: 95.5 fl (ref 78.0–100.0)
Monocytes Absolute: 0.5 10*3/uL (ref 0.1–1.0)
Monocytes Relative: 10.9 % (ref 3.0–12.0)
Neutro Abs: 2.7 10*3/uL (ref 1.4–7.7)
Neutrophils Relative %: 57.3 % (ref 43.0–77.0)
Platelets: 239 10*3/uL (ref 150.0–400.0)
RBC: 4.13 Mil/uL — ABNORMAL LOW (ref 4.22–5.81)
RDW: 13.5 % (ref 11.5–15.5)
WBC: 4.8 10*3/uL (ref 4.0–10.5)

## 2022-04-16 LAB — LIPID PANEL
Cholesterol: 162 mg/dL (ref 0–200)
HDL: 56.8 mg/dL (ref >39.00)
LDL Cholesterol: 88 mg/dL (ref 0–99)
NonHDL: 104.92
Total CHOL/HDL Ratio: 3
Triglycerides: 86 mg/dL (ref 0.0–149.0)
VLDL: 17.2 mg/dL (ref 0.0–40.0)

## 2022-04-16 LAB — BASIC METABOLIC PANEL
BUN: 26 mg/dL — ABNORMAL HIGH (ref 6–23)
CO2: 30 mEq/L (ref 19–32)
Calcium: 9.5 mg/dL (ref 8.4–10.5)
Chloride: 103 mEq/L (ref 96–112)
Creatinine, Ser: 0.88 mg/dL (ref 0.40–1.50)
GFR: 75.55 mL/min (ref >60.00)
Glucose, Bld: 102 mg/dL — ABNORMAL HIGH (ref 70–99)
Potassium: 4.9 mEq/L (ref 3.5–5.1)
Sodium: 138 mEq/L (ref 135–145)

## 2022-04-16 LAB — HEPATIC FUNCTION PANEL
ALT: 14 U/L (ref 0–53)
AST: 22 U/L (ref 0–37)
Albumin: 4.2 g/dL (ref 3.5–5.2)
Alkaline Phosphatase: 50 U/L (ref 39–117)
Bilirubin, Direct: 0.1 mg/dL (ref 0.0–0.3)
Total Bilirubin: 0.4 mg/dL (ref 0.2–1.2)
Total Protein: 7 g/dL (ref 6.0–8.3)

## 2022-04-16 LAB — HEMOGLOBIN A1C: Hgb A1c MFr Bld: 6.2 % (ref 4.6–6.5)

## 2022-04-16 LAB — TSH: TSH: 2.53 u[IU]/mL (ref 0.35–5.50)

## 2022-04-16 MED ORDER — SIMVASTATIN 20 MG PO TABS: 20.0000 mg | ORAL_TABLET | Freq: Every day | ORAL | 3 refills | Status: AC

## 2022-04-16 NOTE — Patient Instructions (Addendum)
Your MOST form was filled out today  Please continue all other medications as before, and refills have been done if requested.  Please have the pharmacy call with any other refills you may need.  Please continue your efforts at being more active, low cholesterol diet, and weight control.  You are otherwise up to date with prevention measures today.  Please keep your appointments with your specialists as you may have planned  Please go to the LAB at the blood drawing area for the tests to be done  You will be contacted by phone if any changes need to be made immediately.  Otherwise, you will receive a letter about your results with an explanation, but please check with MyChart first.  Please remember to sign up for MyChart if you have not done so, as this will be important to you in the future with finding out test results, communicating by private email, and scheduling acute appointments online when needed.  Please make an Appointment to return for your 1 year visit, or sooner if needed

## 2022-04-16 NOTE — Progress Notes (Unsigned)
Patient ID: Darren Edwards, male   DOB: 12-07-1928, 88 y.o.   MRN: [id number]         Chief Complaint:: yearly exam       HPI:  Darren Edwards is a 88 y.o. male here    Walk/runs 3 miles  about 3 days per wk.  Wt stable.      Wt Readings from Last 3 Encounters:  04/16/22 134 lb (60.8 kg)  02/14/21 133 lb (60.3 kg)  05/12/20 135 lb (61.2 kg)   BP Readings from Last 3 Encounters:  04/16/22 120/70  11/06/21 126/71  02/14/21 120/70   Immunization History  Administered Date(s) Administered   Influenza Split 08/09/2013   Influenza Whole 08/03/2008, 08/04/2009, 08/07/2010, 06/28/2012   Influenza, High Dose Seasonal PF 07/28/2013, 06/28/2016, 07/23/2016, 07/28/2017, 08/21/2018, 07/27/2019   Influenza,inj,Quad PF,6+ Mos 08/23/2015   Influenza,inj,Quad PF,6-35 Mos 08/11/2014   Influenza-Unspecified 08/28/2000, 09/21/2004, 09/27/2005, 08/28/2006, 11/28/2006, 07/29/2007, 09/03/2008, 08/05/2009, 06/28/2010, 06/29/2011, 06/28/2012, 07/27/2019   PFIZER(Purple Top)SARS-COV-2 Vaccination 11/16/2019, 12/07/2019, 07/22/2020, 01/29/2021, 07/19/2021   Pneumococcal Conjugate-13 02/24/2015   Pneumococcal Polysaccharide-23 10/16/2018   Pneumococcal-Unspecified 09/27/1997   Tdap 09/27/2009   Zoster, Live 06/28/2010   Health Maintenance Due  Topic Date Due   Zoster Vaccines- Shingrix (1 of 2) Never done   TETANUS/TDAP  09/28/2019   COVID-19 Vaccine (6 - Booster for [employer] series) 09/13/2021      Past Medical History:  Diagnosis Date   Arthritis    Knee   Cancer ([place])    HX SKIN CANCERS / HX PROSTATE CANCER   Family history of cancer of male genital organ    Family history of genetic mutation for hereditary nonpolyposis colorectal cancer (HNPCC)    Family history of lung cancer    Family history of pancreatic cancer    Family history of prostate cancer    Family history of stomach cancer    GERD (gastroesophageal reflux disease)    GERD (gastroesophageal reflux disease) 10/16/2018    Hypercholesteremia    Squamous cell skin cancer 10/16/2018   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 Deatra Ina   MICROLARYNGOSCOPY W/VOCAL CORD INJECTION Right 07/19/2016   Procedure: MICROLARYNGOSCOPY WITH EXCISION OF RIGHT  VOCAL CORD POLYP;  Surgeon: [name], MD;  Location: [facility];  Service: ENT;  Laterality: Right;   PARTIAL KNEE ARTHROPLASTY Left 02/12/2016   Procedure: UNICOMPARTMENTAL LEFT KNEE MEDIALLY;  Surgeon: Paralee Cancel, MD;  Location: [facility];  Service: Orthopedics;  Laterality: Left;   PROSTATECTOMY     SKIN CANCER EXCISION     TONSILLECTOMY      reports that he has been smoking cigars. He has never used smokeless tobacco. He reports current alcohol use of about 7.0 standard drinks of alcohol per week. He reports that he does not use drugs. family history includes Cancer in his paternal aunt and paternal uncle; Cancer (age of onset: 82) in his mother; Lung cancer in his father and sister; Lung cancer (age of onset: 67) in his paternal uncle; Pancreatic cancer in his cousin; Prostate cancer in his cousin; Stomach cancer in his maternal uncle. Allergies  Allergen Reactions   Clindamycin     Other reaction(s): Dizziness (intolerance)   Penicillins Rash    Has patient had a PCN reaction causing immediate rash, facial/tongue/throat swelling, SOB or lightheadedness with hypotension: Yes Has patient had a PCN reaction causing severe  rash involving mucus membranes or skin necrosis: No Has patient had a PCN reaction that required hospitalization: No Has patient had a PCN reaction occurring within the last 10 years: No If all of the above answers are "NO", then may proceed with Cephalosporin use.   Current Outpatient Medications on File Prior to Visit  Medication Sig Dispense Refill   aspirin 81 MG chewable tablet Chew 81 mg by mouth daily.     cholecalciferol  (VITAMIN D) 1000 units tablet Take 1,000 Units by mouth daily.     Coenzyme Q10 (CO Q 10) 100 MG CAPS Take 100 mg by mouth daily.  30 capsule 0   Multiple Vitamins-Minerals (ONE-A-DAY MENS 50+ ADVANTAGE) TABS Take 1 tablet by mouth daily after breakfast.     simvastatin (ZOCOR) 20 MG tablet Take 20 mg by mouth at bedtime.     No current facility-administered medications on file prior to visit.        ROS:  All others reviewed and negative.  Objective        PE:  BP 120/70 (BP Location: Right Arm, Patient Position: Sitting, Cuff Size: Normal)   Pulse (!) 40   Ht '5\' 9"'$  (1.753 m)   Wt 134 lb (60.8 kg)   SpO2 100%   BMI 19.79 kg/m                 Constitutional: Pt appears in NAD               HENT: Head: NCAT.                Right Ear: External ear normal.                 Left Ear: External ear normal.                Eyes: . Pupils are equal, round, and reactive to light. Conjunctivae and EOM are normal               Nose: without d/c or deformity               Neck: Neck supple. Gross normal ROM               Cardiovascular: Normal rate and regular rhythm.                 Pulmonary/Chest: Effort normal and breath sounds without rales or wheezing.                Abd:  Soft, NT, ND, + BS, no organomegaly               Neurological: Pt is alert. At baseline orientation, motor grossly intact               Skin: Skin is warm. No rashes, no other new lesions, LE edema - ***               Psychiatric: Pt behavior is normal without agitation   Micro: none  Cardiac tracings I have personally interpreted today:  none  Pertinent Radiological findings (summarize): none   Lab Results  Component Value Date   WBC 5.0 01/21/2019   HGB 13.7 01/21/2019   HCT 42.9 01/21/2019   PLT 251 01/21/2019   GLUCOSE 114 (H) 01/21/2019   CHOL 168 10/16/2018   TRIG 86.0 10/16/2018   HDL 52.90 10/16/2018   LDLCALC 98 10/16/2018   ALT 12 10/16/2018   AST 16 10/16/2018  NA 139 01/21/2019   K 4.0  01/21/2019   CL 105 01/21/2019   CREATININE 0.93 01/21/2019   BUN 21 01/21/2019   CO2 26 01/21/2019   TSH 1.87 10/16/2018   PSA 0.00 (L) 01/03/2016   INR 1.04 02/02/2016   HGBA1C 6.0 10/16/2018   Assessment/Plan:  Darren Edwards is a 88 y.o. White or Caucasian [1] male with  has a past medical history of Arthritis, Cancer ([place]), Family history of cancer of male genital organ, Family history of genetic mutation for hereditary nonpolyposis colorectal cancer (HNPCC), Family history of lung cancer, Family history of pancreatic cancer, Family history of prostate cancer, Family history of stomach cancer, GERD (gastroesophageal reflux disease), GERD (gastroesophageal reflux disease) (10/16/2018), Hypercholesteremia, Squamous cell skin cancer (10/16/2018), and Vocal cord cancer ([place]) (10/16/2018).  No problem-specific Assessment & Plan notes found for this encounter.  Followup: No follow-ups on file.  Cathlean Cower, MD 04/16/2022 3:33 PM [facility]

## 2022-04-17 LAB — URINALYSIS, ROUTINE W REFLEX MICROSCOPIC
Bilirubin Urine: NEGATIVE
Hgb urine dipstick: NEGATIVE
Ketones, ur: NEGATIVE
Leukocytes,Ua: NEGATIVE
Nitrite: NEGATIVE
RBC / HPF: NONE SEEN (ref 0–?)
Specific Gravity, Urine: 1.01 (ref 1.000–1.030)
Total Protein, Urine: NEGATIVE
Urine Glucose: NEGATIVE
Urobilinogen, UA: 0.2 (ref 0.0–1.0)
WBC, UA: NONE SEEN (ref 0–?)
pH: 6.5 (ref 5.0–8.0)

## 2022-04-17 LAB — VITAMIN B12: Vitamin B-12: 613 pg/mL (ref 211–911)

## 2022-04-17 LAB — VITAMIN D 25 HYDROXY (VIT D DEFICIENCY, FRACTURES): VITD: 67.79 ng/mL (ref 30.00–100.00)

## 2022-04-18 ENCOUNTER — Encounter: Payer: Self-pay | Admitting: Internal Medicine

## 2022-04-18 NOTE — Assessment & Plan Note (Signed)
Mild, d/w pt, for restart otc meds prn including zyrtec 10 mg and nasacort asd prn

## 2022-04-18 NOTE — Assessment & Plan Note (Signed)
BP Readings from Last 3 Encounters:  04/16/22 120/70  11/06/21 126/71  02/14/21 120/70   Stable, pt to continue medical treatment - diet , wt control, exercise, low salt

## 2022-04-18 NOTE — Assessment & Plan Note (Signed)
Lab Results  Component Value Date   LDLCALC 88 04/16/2022   Stable, pt to continue current statin zocor 20 mg qd

## 2022-04-18 NOTE — Assessment & Plan Note (Signed)
Lab Results  Component Value Date   HGBA1C 6.2 04/16/2022   Stable, pt to continue current medical treatment  - diet, wt control

## 2022-06-05 DIAGNOSIS — R490 Dysphonia: Secondary | ICD-10-CM | POA: Diagnosis not present

## 2022-06-05 DIAGNOSIS — J382 Nodules of vocal cords: Secondary | ICD-10-CM | POA: Diagnosis not present

## 2022-06-13 ENCOUNTER — Other Ambulatory Visit: Payer: Self-pay | Admitting: Otolaryngology

## 2022-06-20 DIAGNOSIS — H43811 Vitreous degeneration, right eye: Secondary | ICD-10-CM | POA: Diagnosis not present

## 2022-06-20 DIAGNOSIS — H0015 Chalazion left lower eyelid: Secondary | ICD-10-CM | POA: Diagnosis not present

## 2022-06-20 DIAGNOSIS — H02831 Dermatochalasis of right upper eyelid: Secondary | ICD-10-CM | POA: Diagnosis not present

## 2022-06-20 DIAGNOSIS — H02834 Dermatochalasis of left upper eyelid: Secondary | ICD-10-CM | POA: Diagnosis not present

## 2022-06-20 DIAGNOSIS — H2513 Age-related nuclear cataract, bilateral: Secondary | ICD-10-CM | POA: Diagnosis not present

## 2022-06-25 NOTE — Progress Notes (Signed)
Mr. Phegley did not answer any of my calls, nor did he call me back and I left on his voice mail.  I left  a message on voice mail.  I instructed the patient to arrive at [facility] at --  1005, register in the [facility]. DO NOT eat or drink anything after midnight. i I asked patient to shower with antibiotic soap, dry off with a clean towel.  Do not wear any lotions, powders, cologne, jewelry, piercing, make-up or nail polish.  Wear clean clothes. Brush teeth. I informed patient that there will need to be a driver and someone to stay with him/for the first 24 hours after surgery.   I instructed  patient to call [phone], in the am if there were any questions or problems.

## 2022-06-26 ENCOUNTER — Encounter (HOSPITAL_COMMUNITY)
Admission: RE | Disposition: A | Discharge status: Home / Self Care | Payer: Self-pay | Source: Home / Self Care | Attending: Otolaryngology

## 2022-06-26 ENCOUNTER — Ambulatory Visit (HOSPITAL_COMMUNITY)
Admission: RE | Admit: 2022-06-26 | Discharge: 2022-06-26 | Disposition: A | Discharge status: Home / Self Care | Payer: Medicare Other | Attending: Otolaryngology | Admitting: Otolaryngology

## 2022-06-26 ENCOUNTER — Ambulatory Visit (HOSPITAL_COMMUNITY): Payer: Medicare Other | Admitting: Certified Registered Nurse Anesthetist

## 2022-06-26 ENCOUNTER — Ambulatory Visit (HOSPITAL_BASED_OUTPATIENT_CLINIC_OR_DEPARTMENT_OTHER): Payer: Medicare Other | Admitting: Certified Registered Nurse Anesthetist

## 2022-06-26 ENCOUNTER — Other Ambulatory Visit: Payer: Self-pay

## 2022-06-26 ENCOUNTER — Encounter (HOSPITAL_COMMUNITY): Payer: Self-pay | Admitting: Otolaryngology

## 2022-06-26 DIAGNOSIS — K219 Gastro-esophageal reflux disease without esophagitis: Secondary | ICD-10-CM | POA: Diagnosis not present

## 2022-06-26 DIAGNOSIS — F17290 Nicotine dependence, other tobacco product, uncomplicated: Secondary | ICD-10-CM | POA: Insufficient documentation

## 2022-06-26 DIAGNOSIS — C320 Malignant neoplasm of glottis: Secondary | ICD-10-CM | POA: Diagnosis not present

## 2022-06-26 DIAGNOSIS — R490 Dysphonia: Secondary | ICD-10-CM | POA: Diagnosis not present

## 2022-06-26 DIAGNOSIS — J382 Nodules of vocal cords: Secondary | ICD-10-CM

## 2022-06-26 HISTORY — PX: MICROLARYNGOSCOPY WITH CO2 LASER AND EXCISION OF VOCAL CORD LESION: SHX5970

## 2022-06-26 LAB — BASIC METABOLIC PANEL
Anion gap: 9 (ref 5–15)
BUN: 26 mg/dL — ABNORMAL HIGH (ref 8–23)
CO2: 23 mmol/L (ref 22–32)
Calcium: 9.2 mg/dL (ref 8.9–10.3)
Chloride: 107 mmol/L (ref 98–111)
Creatinine, Ser: 1.12 mg/dL (ref 0.61–1.24)
GFR, Estimated: >60 mL/min (ref >60)
Glucose, Bld: 114 mg/dL — ABNORMAL HIGH (ref 70–99)
Potassium: 4 mmol/L (ref 3.5–5.1)
Sodium: 139 mmol/L (ref 135–145)

## 2022-06-26 LAB — CBC
HCT: 43.5 % (ref 39.0–52.0)
Hemoglobin: 14.3 g/dL (ref 13.0–17.0)
MCH: 32.4 pg (ref 26.0–34.0)
MCHC: 32.9 g/dL (ref 30.0–36.0)
MCV: 98.6 fL (ref 80.0–100.0)
Platelets: 207 10*3/uL (ref 150–400)
RBC: 4.41 MIL/uL (ref 4.22–5.81)
RDW: 12.6 % (ref 11.5–15.5)
WBC: 5.9 10*3/uL (ref 4.0–10.5)
nRBC: 0 % (ref 0.0–0.2)

## 2022-06-26 SURGERY — MICROLARYNGOSCOPY WITH CO2 LASER AND EXCISION OF VOCAL CORD LESION
Anesthesia: General | Laterality: Right

## 2022-06-26 MED ORDER — PROPOFOL 10 MG/ML IV BOLUS
INTRAVENOUS | Status: AC
  Filled 2022-06-26: qty 20

## 2022-06-26 MED ORDER — FENTANYL CITRATE (PF) 250 MCG/5ML IJ SOLN
INTRAMUSCULAR | Status: AC
  Filled 2022-06-26: qty 5

## 2022-06-26 MED ORDER — CHLORHEXIDINE GLUCONATE 0.12 % MT SOLN
15.0000 mL | Freq: Once | OROMUCOSAL | Status: AC
  Administered 2022-06-26: 15 mL via OROMUCOSAL
  Filled 2022-06-26: qty 15

## 2022-06-26 MED ORDER — PROPOFOL 500 MG/50ML IV EMUL
INTRAVENOUS | Status: DC | PRN
  Administered 2022-06-26: 100 ug/kg/min via INTRAVENOUS

## 2022-06-26 MED ORDER — ONDANSETRON HCL 4 MG/2ML IJ SOLN: INTRAMUSCULAR | Status: DC | PRN

## 2022-06-26 MED ORDER — DEXAMETHASONE SODIUM PHOSPHATE 10 MG/ML IJ SOLN
INTRAMUSCULAR | Status: DC | PRN
  Administered 2022-06-26: 10 mg via INTRAVENOUS

## 2022-06-26 MED ORDER — PHENYLEPHRINE 80 MCG/ML (10ML) SYRINGE FOR IV PUSH (FOR BLOOD PRESSURE SUPPORT)
PREFILLED_SYRINGE | INTRAVENOUS | Status: DC | PRN
  Administered 2022-06-26: 80 ug via INTRAVENOUS

## 2022-06-26 MED ORDER — PROPOFOL 10 MG/ML IV BOLUS
INTRAVENOUS | Status: DC | PRN
  Administered 2022-06-26: 70 mg via INTRAVENOUS
  Administered 2022-06-26: 40 mg via INTRAVENOUS

## 2022-06-26 MED ORDER — EPINEPHRINE HCL (NASAL) 0.1 % NA SOLN
NASAL | Status: AC
  Filled 2022-06-26: qty 30

## 2022-06-26 MED ORDER — LIDOCAINE 2% (20 MG/ML) 5 ML SYRINGE
INTRAMUSCULAR | Status: DC | PRN
  Administered 2022-06-26: 60 mg via INTRAVENOUS

## 2022-06-26 MED ORDER — ROCURONIUM BROMIDE 10 MG/ML (PF) SYRINGE
PREFILLED_SYRINGE | INTRAVENOUS | Status: DC | PRN
  Administered 2022-06-26: 30 mg via INTRAVENOUS

## 2022-06-26 MED ORDER — METOPROLOL TARTRATE 5 MG/5ML IV SOLN
INTRAVENOUS | Status: DC | PRN
  Administered 2022-06-26: 1 mg via INTRAVENOUS

## 2022-06-26 MED ORDER — LACTATED RINGERS IV SOLN: INTRAVENOUS | Status: DC

## 2022-06-26 MED ORDER — LIDOCAINE 2% (20 MG/ML) 5 ML SYRINGE
INTRAMUSCULAR | Status: AC
  Filled 2022-06-26: qty 5

## 2022-06-26 MED ORDER — SUGAMMADEX SODIUM 200 MG/2ML IV SOLN
INTRAVENOUS | Status: DC | PRN
  Administered 2022-06-26: 200 mg via INTRAVENOUS

## 2022-06-26 MED ORDER — ONDANSETRON HCL 4 MG/2ML IJ SOLN
INTRAMUSCULAR | Status: AC
Start: 2022-06-26 — End: ?
  Filled 2022-06-26: qty 2

## 2022-06-26 MED ORDER — DEXAMETHASONE SODIUM PHOSPHATE 10 MG/ML IJ SOLN
INTRAMUSCULAR | Status: AC
Start: 2022-06-26 — End: ?
  Filled 2022-06-26: qty 1

## 2022-06-26 MED ORDER — EPINEPHRINE 1 MG/10ML IJ SOSY
PREFILLED_SYRINGE | INTRAMUSCULAR | Status: AC
  Filled 2022-06-26: qty 10

## 2022-06-26 MED ORDER — ONDANSETRON HCL 4 MG/2ML IJ SOLN
INTRAMUSCULAR | Status: DC | PRN
  Administered 2022-06-26: 4 mg via INTRAVENOUS

## 2022-06-26 MED ORDER — ORAL CARE MOUTH RINSE: 15.0000 mL | Freq: Once | OROMUCOSAL | Status: AC

## 2022-06-26 MED ORDER — ROCURONIUM BROMIDE 10 MG/ML (PF) SYRINGE
PREFILLED_SYRINGE | INTRAVENOUS | Status: AC
  Filled 2022-06-26: qty 10

## 2022-06-26 MED ORDER — EPINEPHRINE PF 1 MG/ML IJ SOLN
INTRAMUSCULAR | Status: AC
  Filled 2022-06-26: qty 1

## 2022-06-26 MED ORDER — FENTANYL CITRATE (PF) 250 MCG/5ML IJ SOLN
INTRAMUSCULAR | Status: DC | PRN
  Administered 2022-06-26: 50 ug via INTRAVENOUS
  Administered 2022-06-26 (×2): 25 ug via INTRAVENOUS

## 2022-06-26 SURGICAL SUPPLY — 28 items
BAG COUNTER SPONGE SURGICOUNT (BAG) ×1 IMPLANT
BAG SPNG CNTER NS LX DISP (BAG) ×1
BALLN PULM 12 13.5 15X75 (BALLOONS)
BALLN PULMONARY 10-12 (MISCELLANEOUS) IMPLANT
BALLOON PULM 12 13.5 15X75 (BALLOONS) IMPLANT
BNDG EYE OVAL (GAUZE/BANDAGES/DRESSINGS) ×2 IMPLANT
CANISTER SUCT 3000ML PPV (MISCELLANEOUS) ×1 IMPLANT
CNTNR URN SCR LID CUP LEK RST (MISCELLANEOUS) IMPLANT
CONT SPEC 4OZ STRL OR WHT (MISCELLANEOUS)
COVER BACK TABLE 60X90IN (DRAPES) ×1 IMPLANT
DRAPE HALF SHEET 40X57 (DRAPES) ×1 IMPLANT
GAUZE SPONGE 4X4 12PLY STRL (GAUZE/BANDAGES/DRESSINGS) IMPLANT
GLOVE BIO SURGEON STRL SZ7 (GLOVE) ×1 IMPLANT
KIT BASIN OR (CUSTOM PROCEDURE TRAY) ×1 IMPLANT
KIT TURNOVER KIT B (KITS) ×1 IMPLANT
NDL HYPO 25GX1X1/2 BEV (NEEDLE) IMPLANT
NEEDLE HYPO 25GX1X1/2 BEV (NEEDLE) IMPLANT
NS IRRIG 1000ML POUR BTL (IV SOLUTION) ×1 IMPLANT
PAD ARMBOARD 7.5X6 YLW CONV (MISCELLANEOUS) ×2 IMPLANT
PATTIES SURGICAL .5 X3 (DISPOSABLE) ×1 IMPLANT
POSITIONER HEAD DONUT 9IN (MISCELLANEOUS) IMPLANT
SOL ANTI FOG 6CC (MISCELLANEOUS) IMPLANT
SOLUTION ANTI FOG 6CC (MISCELLANEOUS)
SURGILUBE 2OZ TUBE FLIPTOP (MISCELLANEOUS) IMPLANT
SUT SILK 2 0 PERMA HAND 18 BK (SUTURE) IMPLANT
TOWEL GREEN STERILE FF (TOWEL DISPOSABLE) ×2 IMPLANT
TUBE CONNECTING 12X1/4 (SUCTIONS) ×1 IMPLANT
WATER STERILE IRR 1000ML POUR (IV SOLUTION) ×1 IMPLANT

## 2022-06-26 NOTE — H&P (Signed)
Darren Edwards is an 88 y.o. male.   Chief Complaint: Right vocal cord lesion HPI: Patient has a history of chronic hoarseness and is undergone previous laryngoscopy.  He developed a 1 month history of worsening hoarseness and raspy voice.  Examination including flexible laryngoscopy showed a nodular mass on the right vocal cord.  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 Deatra Ina   MICROLARYNGOSCOPY W/VOCAL CORD INJECTION Right 07/19/2016   Procedure: MICROLARYNGOSCOPY WITH EXCISION OF RIGHT  VOCAL CORD POLYP;  Surgeon: [name], MD;  Location: [facility];  Service: ENT;  Laterality: Right;   PARTIAL KNEE ARTHROPLASTY Left 02/12/2016   Procedure: UNICOMPARTMENTAL LEFT KNEE MEDIALLY;  Surgeon: Paralee Cancel, MD;  [facility];  Service: Orthopedics;  Laterality: Left;   PROSTATECTOMY     SKIN CANCER EXCISION     TONSILLECTOMY      Family History  Problem Relation Age of Onset   Cancer Mother 30       "male" cancer   Lung cancer Father        smoker   Lung cancer Sister        smoker, diagnosed mid-60s   Stomach cancer Maternal Uncle        diagnosed early 72s   Cancer Paternal Aunt        liver or pancreatic cancer, diagnosed mid 101s   Lung cancer Paternal Uncle 71   Cancer Paternal Uncle         unknown type, diagnosed late 10s   Prostate cancer Cousin        diagnosed 53s   Pancreatic cancer Cousin        diagnosed 30s   Colon cancer Neg Hx    Colon polyps Neg Hx    Diabetes Neg Hx    Esophageal cancer Neg Hx    Social History:  reports that he has been smoking cigars. He has never used smokeless tobacco. He reports current alcohol use of about 7.0 standard drinks of alcohol per week. He reports that he does not use drugs.  Allergies:  Allergies  Allergen Reactions   Clindamycin     Dizziness (intolerance)   Penicillins Rash    Has patient had a PCN reaction causing immediate rash, facial/tongue/throat swelling, SOB or lightheadedness with hypotension: Yes Has patient had a PCN reaction causing severe rash involving mucus membranes or skin necrosis: No Has patient had a PCN reaction that required hospitalization: No Has patient had a PCN reaction occurring within the last 10 years: No If all of the above answers are "NO", then may proceed with Cephalosporin use.    Medications Prior to Admission  Medication Sig Dispense Refill   Ascorbic Acid (VITAMIN C) 1000 MG  tablet Take 1,000 mg by mouth daily.     aspirin 81 MG chewable tablet Chew 81 mg by mouth daily.     cholecalciferol (VITAMIN D) 1000 units tablet Take 1,000 Units by mouth daily.     Coenzyme Q10 (CO Q 10) 100 MG CAPS Take 100 mg by mouth daily.  30 capsule 0   Multiple Vitamins-Minerals (ONE-A-DAY MENS 50+ ADVANTAGE) TABS Take 1 tablet by mouth daily after breakfast.     simvastatin (ZOCOR) 20 MG tablet Take 1 tablet (20 mg total) by mouth at bedtime. 90 tablet 3   Zinc 30 MG TABS Take 30 mg by mouth daily.      Results for orders placed or performed during the hospital encounter of 06/26/22 (from the past 48 hour(s))  Basic metabolic panel per protocol     Status: Abnormal   Collection Time: 06/26/22 10:39 AM  Result Value Ref Range   Sodium 139 135 - 145 mmol/L   Potassium 4.0 3.5 - 5.1 mmol/L    Chloride 107 98 - 111 mmol/L   CO2 23 22 - 32 mmol/L   Glucose, Bld 114 (H) 70 - 99 mg/dL    Comment: Glucose reference range applies only to samples taken after fasting for at least 8 hours.   BUN 26 (H) 8 - 23 mg/dL   Creatinine, Ser 1.12 0.61 - 1.24 mg/dL   Calcium 9.2 8.9 - 10.3 mg/dL   GFR, Estimated >60 >60 mL/min    Comment: (NOTE) Calculated using the CKD-EPI Creatinine Equation (2021)    Anion gap 9 5 - 15    Comment: Performed at [facility] [address] [place], [place] [address]  CBC per protocol     Status: None   Collection Time: 06/26/22 10:39 AM  Result Value Ref Range   WBC 5.9 4.0 - 10.5 K/uL   RBC 4.41 4.22 - 5.81 MIL/uL   Hemoglobin 14.3 13.0 - 17.0 g/dL   HCT 43.5 39.0 - 52.0 %   MCV 98.6 80.0 - 100.0 fL   MCH 32.4 26.0 - 34.0 pg   MCHC 32.9 30.0 - 36.0 g/dL   RDW 12.6 11.5 - 15.5 %   Platelets 207 150 - 400 K/uL   nRBC 0.0 0.0 - 0.2 %    Comment: Performed at [facility], [place] [address], [place], [place] [address]   No results found.  Review of Systems  HENT:  Positive for voice change.     Blood pressure (!) 176/78, pulse 100, temperature 98.5 F (36.9 C), temperature source Oral, resp. rate 18, height '5\' 9"'$  (1.753 m), weight 59 kg, SpO2 97 %. Physical Exam Constitutional:      Appearance: Normal appearance.  HENT:     Mouth/Throat:     Comments: Right vocal cord lesion Cardiovascular:     Rate and Rhythm: Normal rate.  Musculoskeletal:     Cervical back: Normal range of motion.  Neurological:     Mental Status: He is alert.      Assessment/Plan Patient admitted for outpatient surgery under general anesthesia: Microlaryngoscopy and right vocal cord stripping with biopsy.  [name], MD 06/26/2022, 12:21 PM

## 2022-06-26 NOTE — Op Note (Signed)
Operative Note: MICROLARYNGOSCOPY and BIOPSY  Patient: Darren Edwards  Medical record number: [id number]  Date:06/26/2022  Pre-operative Indications: Right vocal cord nodule  Postoperative Indications: Same  Surgical Procedure:  Microlaryngoscopy    Excision right vocal cord nodule with vocal cord stripping  Anesthesia: Jet ventilation  Surgeon: Delsa Bern, M.D.  Assist: None  Complications: None  EBL: None   Brief History: The patient is a 88 y.o. male with a history of chronic hoarseness and vocal cord nodule.  Patient underwent previous microlaryngoscopy with vocal cord stripping in 2017 which showed a small area of microinvasion with squamous cell carcinoma.  Patient monitored on a 31-monthbasis.  No problems until the last month with increase in raspy voice and hoarseness.  Flexible laryngoscopy in the office showed a nodular mass in the anterior aspect of the right vocal cord. Given the patient's history and findings I recommended microlaryngoscopy and vocal cord stripping with excision of vocal cord mass under general anesthesia, risks and benefits were discussed in detail with the patient and their family. They understand and agree with [facility]  Surgical Procedure: The patient is brought to the operating room on 06/26/2022 and placed in supine position on the operating table. General endotracheal anesthesia was established without difficulty. When the patient was adequately anesthetized, surgical timeout was performed and correct identification of the patient and the surgical procedure. The patient was positioned and prepped and draped in sterile fashion.  Laryngoscopy was then undertaken with examination of the oral cavity, oropharynx, supraglottis, larynx and hypopharynx.  No other abnormalities were identified.  The laryngoscope was positioned for direct visualization of the patient's larynx.   Endotracheal tube was then removed and jet ventilation was undertaken for maintenance of patient's general anesthesia.  Patient ventilated well with good gas exchange.  With the patient stable and prepared for surgery with adequate jet ventilation, the operating microscope was moved into position for microlaryngoscopy.  Excellent visualization of the entire larynx was achieved.  The patient was found to have 2 mm broad-based papillomatous mass involving the anterior one third of the right true vocal cord and extending onto the superior surface.  Using cup forceps, microscissors and gentle dissection, right vocal cord nodule was excised and sent to pathology for gross microscopic evaluation.  Vocal cord stripping with cup forceps of the vibratory surface of the right vocal cord and including the soft tissue mass along the superior aspect of the right vocal cord was undertaken.  Reexamination showed no evidence of residual disease and minimal bleeding.  The patient's airway was stable, ventilating laryngoscope was removed without difficulty and the patient was managed with mask ventilation anesthesia for the remainder of his anesthesia recovery.  There was no evidence of airway trauma, no bleeding, no intraoral injury or tooth trauma.  Patient was awakened from anesthetic and transferred from the operating room to the recovery room in stable condition. There were no complications and blood loss was minimal.   DDelsa Bern M.D. [facility] 06/26/2022

## 2022-06-26 NOTE — Transfer of Care (Signed)
Immediate Anesthesia Transfer of Care Note  Patient: Darren Edwards  Procedure(s) Performed: MICROLARYNGOSCOPY WITH VOCAL CORD STIPPING OF RIGHT CORD NODULE (Right)  Patient Location: PACU  Anesthesia Type:General  Level of Consciousness: awake, alert  and oriented  Airway & Oxygen Therapy: Patient Spontanous Breathing  Post-op Assessment: Report given to RN, Post -op Vital signs reviewed and stable and Patient moving all extremities  Post vital signs: Reviewed and stable  Last Vitals:  Vitals Value Taken Time  BP 121/75 06/26/22 1400  Temp 36.7 C 06/26/22 1400  Pulse 66 06/26/22 1408  Resp 15 06/26/22 1408  SpO2 94 % 06/26/22 1408  Vitals shown include unvalidated device data.  Last Pain:  Vitals:   06/26/22 1400  TempSrc:   PainSc: 0-No pain         Complications: No notable events documented.

## 2022-06-26 NOTE — Anesthesia Preprocedure Evaluation (Signed)
Anesthesia Evaluation  Patient identified by MRN, date of birth, ID band Patient awake    Reviewed: Allergy & Precautions, NPO status , Patient's Chart, lab work & pertinent test results  History of Anesthesia Complications Negative for: history of anesthetic complications  Airway Mallampati: II  TM Distance: >3 FB     Dental  (+) Teeth Intact, Dental Advisory Given,    Pulmonary neg shortness of breath, neg COPD, neg recent URI, Current Smoker and Patient abstained from smoking.,    breath sounds clear to auscultation       Cardiovascular  Rhythm:Regular Rate:Normal     Neuro/Psych  Neuromuscular disease negative psych ROS   GI/Hepatic Neg liver ROS, GERD  Controlled,  Endo/Other    Renal/GU Renal disease     Musculoskeletal   Abdominal   Peds  Hematology negative hematology ROS (+)   Anesthesia Other Findings Vocal cord ca  Reproductive/Obstetrics                             Anesthesia Physical Anesthesia Plan  ASA: 2  Anesthesia Plan: General   Post-op Pain Management: Minimal or no pain anticipated and Ofirmev IV (intra-op)*   Induction: Intravenous  PONV Risk Score and Plan: 1 and Propofol infusion, Ondansetron and TIVA  Airway Management Planned:   Additional Equipment: None  Intra-op Plan:   Post-operative Plan:   Informed Consent: I have reviewed the patients History and Physical, chart, labs and discussed the procedure including the risks, benefits and alternatives for the proposed anesthesia with the patient or authorized representative who has indicated his/her understanding and acceptance.     Dental advisory given  Plan Discussed with: CRNA  Anesthesia Plan Comments: (Jet ventilation)        Anesthesia Quick Evaluation

## 2022-06-26 NOTE — Anesthesia Procedure Notes (Signed)
Procedure Name: General with mask airway Date/Time: 06/26/2022 1:28 PM  Performed by: Leonor Liv, CRNAPre-anesthesia Checklist: Patient identified, Emergency Drugs available, Suction available, Patient being monitored and Timeout performed Induction Type: IV induction Ventilation: Mask ventilation without difficulty and Oral airway inserted - appropriate to patient size Placement Confirmation: positive ETCO2 Dental Injury: Teeth and Oropharynx as per pre-operative assessment

## 2022-06-27 ENCOUNTER — Encounter (HOSPITAL_COMMUNITY): Payer: Self-pay | Admitting: Otolaryngology

## 2022-06-27 LAB — SURGICAL PATHOLOGY

## 2022-06-27 NOTE — Anesthesia Postprocedure Evaluation (Signed)
Anesthesia Post Note  Patient: Darren Edwards  Procedure(s) Performed: MICROLARYNGOSCOPY WITH VOCAL CORD STIPPING OF RIGHT CORD NODULE (Right)     Patient location during evaluation: PACU Anesthesia Type: General Level of consciousness: awake and patient cooperative Pain management: pain level controlled Vital Signs Assessment: post-procedure vital signs reviewed and stable Respiratory status: spontaneous breathing, nonlabored ventilation and respiratory function stable Cardiovascular status: blood pressure returned to baseline and stable Postop Assessment: no apparent nausea or vomiting Anesthetic complications: no   No notable events documented.  Last Vitals:  Vitals:   06/26/22 1415 06/26/22 1430  BP: 132/73 129/62  Pulse: 66 64  Resp: 18 11  Temp:  36.9 C  SpO2: 95% 97%    Last Pain:  Vitals:   06/26/22 1430  TempSrc:   PainSc: 0-No pain                 Tameisha Covell

## 2022-07-03 ENCOUNTER — Other Ambulatory Visit: Payer: Self-pay | Admitting: Otolaryngology

## 2022-07-03 DIAGNOSIS — C320 Malignant neoplasm of glottis: Secondary | ICD-10-CM

## 2022-07-04 ENCOUNTER — Ambulatory Visit
Admission: RE | Admit: 2022-07-04 | Discharge: 2022-07-04 | Disposition: A | Discharge status: Home / Self Care | Payer: Medicare Other | Source: Ambulatory Visit | Attending: Otolaryngology | Admitting: Otolaryngology

## 2022-07-04 DIAGNOSIS — K110 Atrophy of salivary gland: Secondary | ICD-10-CM | POA: Diagnosis not present

## 2022-07-04 DIAGNOSIS — M47812 Spondylosis without myelopathy or radiculopathy, cervical region: Secondary | ICD-10-CM | POA: Diagnosis not present

## 2022-07-04 DIAGNOSIS — C320 Malignant neoplasm of glottis: Secondary | ICD-10-CM

## 2022-07-04 MED ORDER — IOPAMIDOL (ISOVUE-300) INJECTION 61%
75.0000 mL | Freq: Once | INTRAVENOUS | Status: AC | PRN
  Administered 2022-07-04: 75 mL via INTRAVENOUS

## 2022-07-04 NOTE — Progress Notes (Signed)
Radiation Oncology         [phone] ________________________________  Initial Outpatient Consultation  Name: Darren Edwards MRN: [id number]  Date: 07/05/2022  DOB: Sep 05, 1929  SN:KNLZ, Hunt Oris, MD  [name], MD   REFERRING PHYSICIAN: [name], MD  DIAGNOSIS:    ICD-10-CM   1. 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 Since that time, the patient has been followed by Dr. Wilburn Cornelia on a 17-monthinterval basis, and has been without evidence of recurrent disease until recently.   During a follow up visit with Dr. SWilburn Corneliaearlier this year on 02/05/22, the patient reported a recent episode of right ear fullness, muffled hearing, and fluid sensation which resolved spontaneously. Physical exam performed during this visit showed normal external auditory canals and tympanic membranes without middle ear effusion or infection. Indirect laryngoscopy also performed at the time of this visit showed normal vocal cord mobility without nodule, mass or lesion. Given stable findings, the patient was instructed to return in 6 months for routine re-check and follow-up laryngoscopy.   The patient later followed up with Dr. SWilburn Corneliaon 08/09/323. During this visit, the patient reported several weeks of worsening hoarseness and mild intermittent vocal discomfort. Laryngoscopy  performed during this visit revealed a nodular raised area in the anterior aspect of the right vocal cord.   Subsequently, the underwent right vocal cord biopsies under anesthesia on 06/26/22 under Dr. SWilburn Cornelia  The patient was found to have 2 mm broad-based papillomatous mass involving the anterior one third of the right true vocal cord and extending onto the superior surface. Pathology from the procedure revealed invasive well-differentiated squamous cell carcinoma.   Pertinent imaging thus far includes a soft tissue neck CT on 07/04/22 which showed no abnormal soft tissue mass or enhancement along the right true vocal fold to suggest residual disease s/p excision. CT also showed no evidence of pathologic lymphadenopathy in the neck.  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  Positive genetic testing for Lynch syndrome  PREVIOUS RADIATION THERAPY: No  PAST MEDICAL HISTORY:  has a past medical history of Arthritis, Cancer ([place], Family history of cancer of male genital organ, Family history of genetic mutation for hereditary nonpolyposis colorectal cancer (HNPCC), Family history of lung cancer, Family history of  pancreatic cancer, Family history of prostate cancer, Family history of stomach cancer, GERD (gastroesophageal reflux  disease), GERD (gastroesophageal reflux disease) (10/16/2018), Hypercholesteremia, Squamous cell skin cancer (10/16/2018), and Vocal cord cancer ([place]) (10/16/2018).    PAST SURGICAL HISTORY: Past Surgical History:  Procedure Laterality Date   COLONOSCOPY  2011   w/Dr. Deatra Ina   MICROLARYNGOSCOPY W/VOCAL CORD INJECTION Right 07/19/2016   Procedure: MICROLARYNGOSCOPY WITH EXCISION OF RIGHT  VOCAL CORD POLYP;  Surgeon: [name], MD;  Location: [place];  Service: ENT;  Laterality: Right;   MICROLARYNGOSCOPY WITH CO2 LASER AND EXCISION OF VOCAL CORD LESION Right 06/26/2022   Procedure: MICROLARYNGOSCOPY WITH VOCAL CORD [facility] OF RIGHT CORD NODULE;  Surgeon: [name], MD;  Location: [place];  Service: ENT;  Laterality: Right;   PARTIAL KNEE ARTHROPLASTY Left 02/12/2016   Procedure: UNICOMPARTMENTAL LEFT KNEE MEDIALLY;  Surgeon: Paralee Cancel, MD;  Location: [facility];  Service: Orthopedics;  Laterality: Left;   PROSTATECTOMY     SKIN CANCER EXCISION     TONSILLECTOMY      FAMILY HISTORY: family history includes Cancer in his paternal aunt and paternal uncle; Cancer (age of onset: 49) in his mother; Lung cancer in his father and sister; Lung cancer (age of onset: 22) in his paternal uncle; Pancreatic cancer in his cousin; Prostate cancer in his cousin; Stomach cancer in his maternal uncle.  SOCIAL HISTORY:  reports that he has been smoking cigars. He has never used smokeless tobacco. He reports that he does not currently use alcohol. He reports that he does not use drugs.  ALLERGIES: Clindamycin and Penicillins  MEDICATIONS:  Current Outpatient Medications  Medication Sig Dispense Refill   Ascorbic Acid (VITAMIN C) 1000 MG tablet Take 1,000 mg by mouth daily.     aspirin 81 MG chewable tablet Chew 81 mg by mouth daily.     cholecalciferol (VITAMIN D) 1000 units tablet Take 1,000 Units by mouth daily.     Coenzyme Q10 (CO Q 10) 100 MG CAPS Take 100 mg by mouth daily.  30 capsule 0    Multiple Vitamins-Minerals (ONE-A-DAY MENS 50+ ADVANTAGE) TABS Take 1 tablet by mouth daily after breakfast.     simvastatin (ZOCOR) 20 MG tablet Take 1 tablet (20 mg total) by mouth at bedtime. 90 tablet 3   Zinc 30 MG TABS Take 30 mg by mouth daily.     No current facility-administered medications for this encounter.    REVIEW OF SYSTEMS:  Notable for that above.   PHYSICAL EXAM:  height is '5\' 9"'$  (1.753 m) and weight is 126 lb 12.8 oz (57.5 kg). His temperature is 97.5 F (36.4 C) (abnormal). His blood pressure is 169/80 (abnormal) and his pulse is 102 (abnormal). His respiration is 20 and oxygen saturation is 97%.   General: Alert and oriented, in no acute distress HEENT: Head is normocephalic. Extraocular movements are intact.  Mouth /upper oropharynx notable for no lesions.  He is moderately hoarse. Neck: Neck is notable for no masses Heart: Regular in rate and rhythm with no murmurs, rubs, or gallops. Chest: Clear to auscultation bilaterally, with no rhonchi, wheezes, or rales. Abdomen: Soft, nontender, nondistended, with no rigidity or guarding. Extremities: No edema.   Erythema and ecchymoses and dry skin noted in lower legs Lymphatics: see Neck Exam Skin: No concerning lesions. Musculoskeletal: symmetric strength and muscle tone throughout. Neurologic: Cranial nerves II through XII are grossly intact. No obvious focalities. Speech is fluent. Coordination is intact. Psychiatric: Judgment and insight are intact.  Affect is appropriate.   ECOG = 1  0 - Asymptomatic (Fully active, able to carry on all predisease activities without restriction)  1 - Symptomatic but completely ambulatory (Restricted in physically strenuous activity but ambulatory and able to carry out work of a light or sedentary nature. For example, light housework, office work)  2 - Symptomatic, <50% in bed during the day (Ambulatory and capable of all self care but unable to carry out any work activities. Up and  about more than 50% of waking hours)  3 - Symptomatic, >50% in bed, but not bedbound (Capable of only limited self-care, confined to bed or chair 50% or more of waking hours)  4 - Bedbound (Completely disabled. Cannot carry on any self-care. Totally confined to bed or chair)  5 - Death   Eustace Pen MM, Creech RH, Tormey DC, et al. [phone]). "Toxicity and response criteria of the [facility]". [place] Oncol. 5 (6): 649-55   LABORATORY DATA:  Lab Results  Component Value Date   WBC 5.9 06/26/2022   HGB 14.3 06/26/2022   HCT 43.5 06/26/2022   MCV 98.6 06/26/2022   PLT 207 06/26/2022   CMP     Component Value Date/Time   NA 139 06/26/2022 1039   K 4.0 06/26/2022 1039   CL 107 06/26/2022 1039   CO2 23 06/26/2022 1039   GLUCOSE 114 (H) 06/26/2022 1039   BUN 26 (H) 06/26/2022 1039   CREATININE 1.12 06/26/2022 1039   CALCIUM 9.2 06/26/2022 1039   PROT 7.0 04/16/2022 1608   ALBUMIN 4.2 04/16/2022 1608   AST 22 04/16/2022 1608   ALT 14 04/16/2022 1608   ALKPHOS 50 04/16/2022 1608   BILITOT 0.4 04/16/2022 1608   GFRNONAA >60 06/26/2022 1039   GFRAA >60 01/21/2019 1857      Lab Results  Component Value Date   TSH 2.53 04/16/2022     RADIOGRAPHY: CT SOFT TISSUE NECK W CONTRAST  Result Date: 07/04/2022 CLINICAL DATA:  Nodule removed from vocal cord. EXAM: CT NECK WITH CONTRAST TECHNIQUE: Multidetector CT imaging of the neck was performed using the standard protocol following the bolus administration of intravenous contrast. RADIATION DOSE REDUCTION: This exam was performed according to the departmental dose-optimization program which includes automated exposure control, adjustment of the mA and/or kV according to patient size and/or use of iterative reconstruction technique. CONTRAST:  85m ISOVUE-300 IOPAMIDOL (ISOVUE-300) INJECTION 61% COMPARISON:  None Available. FINDINGS: Pharynx and larynx: The nasal cavity and nasopharynx are unremarkable. The oral cavity  and oropharynx are unremarkable. The parapharyngeal spaces are clear. The hypopharynx and larynx are unremarkable. The vocal folds are normal in appearance. Specifically, there is no evidence of residual soft tissue mass along the right true vocal fold following excision. There is no retropharyngeal fluid collection. Salivary glands: The parotid glands are unremarkable. The submandibular glands are atrophic, right more than left. Thyroid: Unremarkable. Lymph nodes: There is no pathologic lymphadenopathy in the neck. Vascular: There is calcified atherosclerotic plaque of the bilateral carotid bifurcations. The major vasculature of the neck is otherwise unremarkable. Limited intracranial: The imaged portions of the intracranial compartment are unremarkable. Visualized orbits: The globes and orbits are unremarkable. Mastoids and visualized paranasal sinuses: Clear. Skeleton: There is no acute osseous abnormality or suspicious osseous lesion. There is multilevel facet arthropathy in the cervical spine, left worse than right. Upper chest: The imaged lung apices are clear. Other: None. IMPRESSION: 1. No abnormal soft tissue mass or enhancement along the right true  vocal fold to suggest residual disease following excision. 2. No pathologic lymphadenopathy in the neck. Electronically Signed   By: Valetta Mole M.D.   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  Dr. Wilburn Cornelia does not recommend surgical resection in order observation.  He was referred for consideration of radiation therapy.  I recommend radiotherapy for this patient over a 4-week hypofractionated course.  We talked about a 6-week course of radiation therapy versus a 4-week course where a slightly higher dose is given per day.  This 4-week course is a good alternative for elderly patients and we have had good success with it in our clinic.  I think is more likely for him to  complete the 4-week course of radiation therapy without excessive side effects.  He and his family are enthusiastic about this option.  We discussed the potential risks, benefits, and side effects of radiotherapy. We talked in detail about acute and late effects. We discussed that some of the most bothersome acute effects may be mucositis, dysgeusia, salivary changes, skin irritation,  dehydration, weight loss and fatigue. We talked about late effects which include but are not necessarily limited to dysphagia, hypothyroidism, nerve injury, vascular injury, spinal cord injury, laryngeal necrosis , worsening hoarseness , neck edema, and potential injury to any of the tissues in the head and neck region. No guarantees of treatment were given. A consent form was signed and placed in the patient's medical record. The patient is enthusiastic about proceeding with treatment. I look forward to participating in the patient's care.    Simulation (treatment planning) will take place in 1 week.     We also discussed that the treatment of head and neck cancer is a multidisciplinary process to maximize treatment outcomes and quality of life. For this reason the following referrals have been or will be made:      Nutritionist for nutrition support during and after treatment.   Speech language pathology for swallowing and/or speech therapy.   Social work for social support.    Physical therapy due to risk of lymphedema in neck and deconditioning -he is an avid exerciser and wants to continue exercising during treatment and also be proactive about rehabilitation post treatment.  Patient advised to hold vitamin C supplements during treatment.  It is okay for him to take a multivitamin.  Family asked about whether he needs imaging of his chest.  Since he denies any significant smoking history and his glottic cancer is early stage, I think that imaging of his chest is much more likely to show a false positive than a  true positive.  He understands his type of cancer is very unlikely to metastasize to the chest.  We will only order chest imaging as clinically indicated in the future and they are comfortable with this plan.  On date of service, in total, I spent 70 minutes on this encounter. Patient was seen in person.  __________________________________________   [name], MD  This document serves as a record of services personally performed by [name], MD. It was created on her behalf by Roney Mans, a trained medical scribe. The creation of this record is based on the scribe's personal observations and the provider's statements to them. This document has been checked and approved by the attending provider.

## 2022-07-05 ENCOUNTER — Other Ambulatory Visit: Payer: Self-pay

## 2022-07-05 ENCOUNTER — Ambulatory Visit
Admission: RE | Admit: 2022-07-05 | Discharge: 2022-07-05 | Disposition: A | Discharge status: Home / Self Care | Payer: Medicare Other | Source: Ambulatory Visit | Attending: Radiation Oncology | Admitting: Radiation Oncology

## 2022-07-05 ENCOUNTER — Encounter: Payer: Self-pay | Admitting: Radiation Oncology

## 2022-07-05 VITALS — BP 169/80 | HR 102 | Temp 97.5°F | Resp 20 | Ht 69.0 in | Wt 126.8 lb

## 2022-07-05 DIAGNOSIS — C320 Malignant neoplasm of glottis: Secondary | ICD-10-CM | POA: Diagnosis not present

## 2022-07-05 DIAGNOSIS — I6523 Occlusion and stenosis of bilateral carotid arteries: Secondary | ICD-10-CM | POA: Diagnosis not present

## 2022-07-05 DIAGNOSIS — Z1509 Genetic susceptibility to other malignant neoplasm: Secondary | ICD-10-CM | POA: Diagnosis not present

## 2022-07-05 DIAGNOSIS — F1010 Alcohol abuse, uncomplicated: Secondary | ICD-10-CM | POA: Diagnosis not present

## 2022-07-05 DIAGNOSIS — Z7982 Long term (current) use of aspirin: Secondary | ICD-10-CM | POA: Insufficient documentation

## 2022-07-05 DIAGNOSIS — Z801 Family history of malignant neoplasm of trachea, bronchus and lung: Secondary | ICD-10-CM | POA: Insufficient documentation

## 2022-07-05 DIAGNOSIS — F17210 Nicotine dependence, cigarettes, uncomplicated: Secondary | ICD-10-CM | POA: Diagnosis not present

## 2022-07-05 DIAGNOSIS — R490 Dysphonia: Secondary | ICD-10-CM | POA: Insufficient documentation

## 2022-07-05 DIAGNOSIS — Z800 Family history of malignant neoplasm of digestive organs: Secondary | ICD-10-CM | POA: Diagnosis not present

## 2022-07-05 NOTE — Progress Notes (Addendum)
Head and Neck Cancer Location of Tumor / Histology:  Squamous cell carcinoma of the glottis  Patient presented with symptoms of: (Dr. Victorio Palm 06/05/22 office note): The patient reports his voice quality has deteriorated over the last month and he has mild intermittent discomfort...history of chronic hoarseness. He is a never smoker. He reports worsening hoarseness and mild vocal discomfort over the last several weeks. He underwent microlaryngoscopy with right vocal cord stripping performed in 04/2016, pathology showed small area of microinvasive squamous cell carcinoma. No radiation therapy performed. He has been followed on a 26-monthinterval basis since that time   Biopsies revealed:  06/26/2022 FINAL MICROSCOPIC DIAGNOSIS:  A. VOCAL CORD, RIGHT, BIOPSY:  -  Invasive well differentiated squamous cell carcinoma.   Nutrition Status Yes No Comments  Weight changes? '[x]'$  '[]'$  Slight unintentional weight loss since surgery  Swallowing concerns? '[x]'$  '[]'$  Has difficulty with solid foods if they don't have a sauce or gravy with them  PEG? '[]'$  '[x]'$     Referrals Yes No Comments  Social Work? '[x]'$  '[]'$    Dentistry? '[x]'$  '[]'$    Swallowing therapy? '[x]'$  '[]'$    Nutrition? '[x]'$  '[]'$    Med/Onc? '[]'$  '[x]'$     Safety Issues Yes No Comments  Prior radiation? '[]'$  '[x]'$  Does mention receiving radiation to his feet as a child when he would get fitted for new shoes  Pacemaker/ICD? '[]'$  '[x]'$    Possible current pregnancy? '[]'$  '[x]'$  N/A  Is the patient on methotrexate? '[]'$  '[x]'$     Tobacco/Marijuana/Snuff/ETOH use: Patient has never smoked. Reports he is an occasional/social drinker. Denies any recreational drug use  Past/Anticipated interventions by otolaryngology, if any:  07/02/2022 --Dr. DJerrell Belfast(telephone note) Pathology from the resected tissue shows well differentiated invasive squamous cell carcinoma. Based on his history and findings he be staged as a T1N20 left vocal cord cancer. The implications of these findings were  discussed with the patient and his wife, [facility]. SIsidore Moosfor evaluation and radiation therapy as primary treatment for his tumor. Plan follow-up with me in approximately 3 months.  06/26/2022 --Dr. DJerrell BelfastMicrolaryngoscopy Excision right vocal cord nodule with vocal cord stripping  06/05/2022 --Dr. DJerrell Belfast(office visit)  Flexible Laryngoscopy  Shows a nodular raised area in the anterior aspect of the right vocal cord which represents a change from previous exam.  Recommend microlaryngoscopy with biopsy/vocal cord stripping  Past/Anticipated interventions by medical oncology, if any:  No referral placed at this time   Current Complaints / other details:  History of prostate cancer (s/p robotic prostatectomy 09/2005) and skin cancer. Daughter also reports history of Lynch syndrome

## 2022-07-05 NOTE — Progress Notes (Signed)
Oncology Nurse Navigator Documentation   Met with patient during initial consult with De. Squire. He was accompanied by his wife and daughter.  Further introduced myself as his/their Navigator, explained my role as a member of the Care Team. Provided New Patient Information packet: Contact information for physician, this navigator, other members of the Care Team Advance Directive information ([place] blue pamphlet with LCSW insert); provided [facility] AD booklet at his request,  Fall Prevention Patient [place] Information sheet Symptom Management Clinic information [facility] campus map with highlight of [place] SLP Information sheet Assisted with post-consult appt scheduling. I toured him to CT simulation and the [facility] treatment area, explained procedures for lobby registration, arrival to Radiation Waiting, and arrival to treatment area.  He voiced understanding.   They verbalized understanding of information provided. I encouraged them to call with questions/concerns moving forward.  Harlow Asa, RN, BSN, OCN Head & Neck Oncology Nurse [place] at [place] [phone]

## 2022-07-08 NOTE — Progress Notes (Signed)
Oncology Nurse Navigator Documentation   I received a call from Mr. Edelman and his wife. They informed me that he would like to take another week or two to consider receiving radiation therapy after discussing it over the weekend. I have informed Dr. Isidore Moos and CT simulation that they requested his CT simulation scheduled on 9/15 to be cancelled. They verbalized that they will call me back with their decision soon. I will follow up on 9/22 if I haven't heard from them.   Harlow Asa RN, BSN, OCN Head & Neck Oncology Nurse [place] at [facility] Phone # [phone]  Fax # [phone]

## 2022-07-10 DIAGNOSIS — H0015 Chalazion left lower eyelid: Secondary | ICD-10-CM | POA: Diagnosis not present

## 2022-07-10 DIAGNOSIS — H43811 Vitreous degeneration, right eye: Secondary | ICD-10-CM | POA: Diagnosis not present

## 2022-07-10 DIAGNOSIS — H2513 Age-related nuclear cataract, bilateral: Secondary | ICD-10-CM | POA: Diagnosis not present

## 2022-07-10 DIAGNOSIS — H02831 Dermatochalasis of right upper eyelid: Secondary | ICD-10-CM | POA: Diagnosis not present

## 2022-07-10 DIAGNOSIS — H02834 Dermatochalasis of left upper eyelid: Secondary | ICD-10-CM | POA: Diagnosis not present

## 2022-07-10 DIAGNOSIS — H0102A Squamous blepharitis right eye, upper and lower eyelids: Secondary | ICD-10-CM | POA: Diagnosis not present

## 2022-07-10 DIAGNOSIS — H0102B Squamous blepharitis left eye, upper and lower eyelids: Secondary | ICD-10-CM | POA: Diagnosis not present

## 2022-07-11 ENCOUNTER — Other Ambulatory Visit: Payer: Medicare Other

## 2022-07-12 ENCOUNTER — Ambulatory Visit: Payer: Medicare Other | Admitting: Radiation Oncology

## 2022-07-15 DIAGNOSIS — L570 Actinic keratosis: Secondary | ICD-10-CM | POA: Diagnosis not present

## 2022-07-15 DIAGNOSIS — Z85828 Personal history of other malignant neoplasm of skin: Secondary | ICD-10-CM | POA: Diagnosis not present

## 2022-07-15 DIAGNOSIS — L820 Inflamed seborrheic keratosis: Secondary | ICD-10-CM | POA: Diagnosis not present

## 2022-07-15 DIAGNOSIS — D3617 Benign neoplasm of peripheral nerves and autonomic nervous system of trunk, unspecified: Secondary | ICD-10-CM | POA: Diagnosis not present

## 2022-07-15 DIAGNOSIS — L821 Other seborrheic keratosis: Secondary | ICD-10-CM | POA: Diagnosis not present

## 2022-07-15 DIAGNOSIS — D1801 Hemangioma of skin and subcutaneous tissue: Secondary | ICD-10-CM | POA: Diagnosis not present

## 2022-07-16 DIAGNOSIS — C320 Malignant neoplasm of glottis: Secondary | ICD-10-CM | POA: Diagnosis not present

## 2022-07-17 ENCOUNTER — Ambulatory Visit: Payer: Medicare Other | Admitting: Radiation Oncology

## 2022-07-18 ENCOUNTER — Other Ambulatory Visit: Payer: Self-pay

## 2022-07-18 ENCOUNTER — Ambulatory Visit: Payer: Medicare Other

## 2022-07-18 DIAGNOSIS — C320 Malignant neoplasm of glottis: Secondary | ICD-10-CM

## 2022-07-19 ENCOUNTER — Ambulatory Visit: Payer: Medicare Other

## 2022-07-19 ENCOUNTER — Inpatient Hospital Stay: Payer: Medicare Other | Attending: Licensed Clinical Social Worker | Admitting: Licensed Clinical Social Worker

## 2022-07-19 NOTE — Progress Notes (Signed)
[place] Work  Initial Assessment   Darren Edwards is a 88 y.o. year old male contacted by phone. Wife, Leda Gauze, also on the call. Clinical Social Work was referred by nurse navigator for assessment of psychosocial needs.   SDOH (Social Determinants of Health) assessments performed: Yes SDOH Interventions    Flowsheet Row Clinical Support from 07/19/2022 in [facility]  SDOH Interventions   Food Insecurity Interventions Intervention Not Indicated  Housing Interventions Intervention Not Indicated  Transportation Interventions Intervention Not Indicated  Utilities Interventions Intervention Not Indicated  Financial Strain Interventions Intervention Not Indicated       SDOH Screenings   Food Insecurity: No Food Insecurity (07/19/2022)  Housing: Low Risk  (07/19/2022)  Transportation Needs: No Transportation Needs (07/19/2022)  Utilities: Not At Risk (07/19/2022)  Depression (PHQ2-9): Low Risk  (04/16/2022)  Financial Resource Strain: Low Risk  (07/19/2022)  Tobacco Use: High Risk (07/05/2022)     Distress Screen completed: No     No data to display            Family/Social Information:  Housing Arrangement: patient lives with wife Family members/support persons in your life? Family Transportation concerns: no  Employment: Retired .  Income source: Paediatric nurse concerns: No Type of concern: None Food access concerns: no Religious or spiritual practice: Not known Services Currently in place:  n/a  Coping/ Adjustment to diagnosis: Patient understands treatment plan and what happens next? yes Patient reported stressors: Adjusting to my illness Patient enjoys exercise and time with family/ friends Current coping skills/ strengths: Capable of independent living , Special hobby/interest , and Supportive family/friends     SUMMARY: Current SDOH Barriers:  None noted today  Clinical Social Work Clinical  Goal(s):  No clinical social work goals at this time  Interventions: Informed patient of the support team roles and support services at [facility] Provided [place] contact information and encouraged patient to call with any questions or concerns   Follow Up Plan: Patient will contact CSW with any support or resource needs Patient verbalizes understanding of plan: Yes    Kota Ciancio E Adan Beal, LCSW

## 2022-07-20 ENCOUNTER — Encounter: Payer: Self-pay | Admitting: Internal Medicine

## 2022-07-22 ENCOUNTER — Ambulatory Visit: Payer: Medicare Other

## 2022-07-22 ENCOUNTER — Ambulatory Visit
Admission: RE | Admit: 2022-07-22 | Discharge: 2022-07-22 | Disposition: A | Discharge status: Home / Self Care | Payer: Medicare Other | Source: Ambulatory Visit | Attending: Radiation Oncology | Admitting: Radiation Oncology

## 2022-07-22 ENCOUNTER — Other Ambulatory Visit: Payer: Self-pay

## 2022-07-22 DIAGNOSIS — C320 Malignant neoplasm of glottis: Secondary | ICD-10-CM | POA: Diagnosis not present

## 2022-07-22 NOTE — Progress Notes (Signed)
Oncology Nurse Navigator Documentation   To provide support, encouragement and care continuity, met with [name] after his CT SIM. He was accompanied by his wife and daughter.  He tolerated procedure without difficulty, denied questions/concerns.    I encouraged him to call me prior to his 07/29/22  [facility].   Harlow Asa RN, BSN, OCN Head & Neck Oncology Nurse [place] at [facility] Phone # [phone]  Fax # [phone]

## 2022-07-23 ENCOUNTER — Ambulatory Visit: Payer: Medicare Other

## 2022-07-24 ENCOUNTER — Ambulatory Visit: Payer: Medicare Other

## 2022-07-24 DIAGNOSIS — Z23 Encounter for immunization: Secondary | ICD-10-CM | POA: Diagnosis not present

## 2022-07-25 ENCOUNTER — Ambulatory Visit: Payer: Medicare Other

## 2022-07-26 ENCOUNTER — Ambulatory Visit: Payer: Medicare Other

## 2022-07-26 DIAGNOSIS — C320 Malignant neoplasm of glottis: Secondary | ICD-10-CM | POA: Diagnosis not present

## 2022-07-29 ENCOUNTER — Other Ambulatory Visit: Payer: Self-pay

## 2022-07-29 ENCOUNTER — Ambulatory Visit: Payer: Medicare Other

## 2022-07-29 ENCOUNTER — Ambulatory Visit
Admission: RE | Admit: 2022-07-29 | Discharge: 2022-07-29 | Disposition: A | Discharge status: Home / Self Care | Payer: Medicare Other | Source: Ambulatory Visit | Attending: Radiation Oncology | Admitting: Radiation Oncology

## 2022-07-29 DIAGNOSIS — C320 Malignant neoplasm of glottis: Secondary | ICD-10-CM | POA: Insufficient documentation

## 2022-07-29 DIAGNOSIS — Z510 Encounter for antineoplastic radiation therapy: Secondary | ICD-10-CM | POA: Diagnosis not present

## 2022-07-29 LAB — RAD ONC ARIA SESSION SUMMARY
Course Elapsed Days: 0
Plan Fractions Treated to Date: 1
Plan Prescribed Dose Per Fraction: 2.7 Gy
Plan Total Fractions Prescribed: 20
Plan Total Prescribed Dose: 54 Gy
Reference Point Dosage Given to Date: 2.7 Gy
Reference Point Session Dosage Given: 2.7 Gy
Session Number: 1

## 2022-07-29 MED ORDER — SONAFINE EX EMUL
1.0000 | Freq: Two times a day (BID) | CUTANEOUS | Status: DC
  Administered 2022-07-29: 1 via TOPICAL

## 2022-07-29 NOTE — Progress Notes (Signed)
Pt here for patient teaching.    Pt given Radiation and You booklet, skin care instructions, and Sonafine.    Reviewed areas of pertinence such as fatigue, hair loss in treatment field, mouth changes, skin changes, throat changes, cough, and taste changes .   Pt able to give teach back of to pat skin, use unscented/gentle soap, and drink plenty of water,apply Sonafine bid, avoid applying anything to skin within 4 hours of treatment, and to use an electric razor if they must shave.   Pt demonstrated understanding and verbalizes understanding of information given and will contact nursing with any questions or concerns.    [email or web address]

## 2022-07-30 ENCOUNTER — Encounter: Payer: Self-pay | Admitting: Physical Therapy

## 2022-07-30 ENCOUNTER — Ambulatory Visit: Payer: Medicare Other

## 2022-07-30 ENCOUNTER — Other Ambulatory Visit: Payer: Self-pay

## 2022-07-30 ENCOUNTER — Inpatient Hospital Stay: Payer: Medicare Other | Attending: Licensed Clinical Social Worker | Admitting: Nutrition

## 2022-07-30 ENCOUNTER — Ambulatory Visit
Admission: RE | Admit: 2022-07-30 | Discharge: 2022-07-30 | Disposition: A | Discharge status: Home / Self Care | Payer: Medicare Other | Source: Ambulatory Visit | Attending: Radiation Oncology | Admitting: Radiation Oncology

## 2022-07-30 ENCOUNTER — Ambulatory Visit: Payer: Medicare Other | Attending: Radiation Oncology | Admitting: Physical Therapy

## 2022-07-30 DIAGNOSIS — R490 Dysphonia: Secondary | ICD-10-CM | POA: Insufficient documentation

## 2022-07-30 DIAGNOSIS — C320 Malignant neoplasm of glottis: Secondary | ICD-10-CM | POA: Insufficient documentation

## 2022-07-30 DIAGNOSIS — Z510 Encounter for antineoplastic radiation therapy: Secondary | ICD-10-CM | POA: Diagnosis not present

## 2022-07-30 DIAGNOSIS — R1310 Dysphagia, unspecified: Secondary | ICD-10-CM

## 2022-07-30 DIAGNOSIS — R293 Abnormal posture: Secondary | ICD-10-CM | POA: Diagnosis not present

## 2022-07-30 LAB — RAD ONC ARIA SESSION SUMMARY
Course Elapsed Days: 1
Plan Fractions Treated to Date: 2
Plan Prescribed Dose Per Fraction: 2.7 Gy
Plan Total Fractions Prescribed: 20
Plan Total Prescribed Dose: 54 Gy
Reference Point Dosage Given to Date: 5.4 Gy
Reference Point Session Dosage Given: 2.7 Gy
Session Number: 2

## 2022-07-30 NOTE — Progress Notes (Signed)
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 Isidore Moos.  Past medical history includes GERD, hypercholesterolemia, vocal cord cancer in 2019, and squamous cell skin cancer.  Medications include vitamin D, co-Q 10, multivitamin, and zinc.  Labs include glucose 114 and BUN 26.  Height: 5 feet 9 inches. Weight: 129 pounds per patient on scale yesterday.   Usual body weight: 134 pounds per patient. BMI: 19.05.  Patient attends nutrition consult with wife and daughter.  He eats a very healthy diet but small amounts.  He has been a longtime exerciser and was running and doing sit ups and push-ups.  He currently denies nutrition impact symptoms.  He has questions about protein powders, nutrition supplements, and vitamin mineral supplementation.  Nutrition diagnosis: Food and nutrition related knowledge deficit related to cancer and associated treatments as evidenced by no prior need for nutrition related information.  Intervention: Educated on importance of choosing small amounts of high-calorie, high-protein foods frequently throughout the day. Reviewed food sources of protein in diet. Since patient has a good appetite and is eating a healthy diet I encouraged him to continue and choose more high-protein foods. Encouraged patient to drink Ensure compact at least once daily. Try Carnation breakfast essentials mixed with whole milk.  Provided samples. Activity as tolerated. Provided nutrition fact sheets.  Contact information given.  Questions were answered.  Monitoring, evaluation, goals: Patient will tolerate increased calories and protein to minimize weight loss throughout treatment.  Next visit: Patient prefers to contact RD with questions or concerns.  **Disclaimer: This note was dictated with voice recognition software. Similar sounding words can inadvertently be transcribed and this note may contain transcription errors which may not  have been corrected upon publication of note.**

## 2022-07-30 NOTE — Therapy (Signed)
OUTPATIENT SPEECH LANGUAGE PATHOLOGY ONCOLOGY EVALUATION   Patient Name: Darren Edwards MRN: [id number] DOB:Jul 03, 1929, 88 y.o., male Today's Date: 07/31/2022  PCP: Cathlean Cower, MD REFERRING PROVIDER: [name], MD   End of Session - 07/31/22 0015     Visit Number 1    Number of Visits 7    Date for SLP Re-Evaluation 10/28/22    SLP Start Time 6945    SLP Stop Time  0388    SLP Time Calculation (min) 42 min    Activity Tolerance Patient tolerated treatment well             Past Medical History:  Diagnosis Date   Arthritis    Knee   Cancer ([place])    HX SKIN CANCERS / HX PROSTATE CANCER   Family history of cancer of male genital organ    Family history of genetic mutation for hereditary nonpolyposis colorectal cancer (HNPCC)    Family history of lung cancer    Family history of pancreatic cancer    Family history of prostate cancer    Family history of stomach cancer    GERD (gastroesophageal reflux disease)    GERD (gastroesophageal reflux disease) 10/16/2018   Hypercholesteremia    Squamous cell skin cancer 10/16/2018   Vocal cord cancer ([place]) 10/16/2018   Superficially invasive squamous cell    Past Surgical History:  Procedure Laterality Date   COLONOSCOPY  2011   w/Dr. Deatra Ina   MICROLARYNGOSCOPY W/VOCAL CORD INJECTION Right 07/19/2016   Procedure: MICROLARYNGOSCOPY WITH EXCISION OF RIGHT  VOCAL CORD POLYP;  Surgeon: [name], MD;  Location: [facility] OR;  Service: ENT;  Laterality: Right;   MICROLARYNGOSCOPY WITH CO2 LASER AND EXCISION OF VOCAL CORD LESION Right 06/26/2022   Procedure: MICROLARYNGOSCOPY WITH VOCAL CORD [facility] OF RIGHT CORD NODULE;  Surgeon: [name], MD;  Location: [place];  Service: ENT;  Laterality: Right;   PARTIAL KNEE ARTHROPLASTY Left 02/12/2016   Procedure: UNICOMPARTMENTAL LEFT KNEE MEDIALLY;  Surgeon: Paralee Cancel, MD;  Location: [facility] ORS;  Service: Orthopedics;  Laterality: Left;   PROSTATECTOMY     SKIN CANCER EXCISION      TONSILLECTOMY     Patient Active Problem List   Diagnosis Date Noted   Actinic keratosis 04/16/2022   Encounter for fitting and adjustment of hearing aid 04/16/2022   Personal history of malignant melanoma of skin 04/16/2022   HLD (hyperlipidemia) 04/16/2022   Ear fullness, right 02/05/2022   Muscle strain of right gluteal region 05/12/2020   Genetic testing 07/28/2019   Family history of genetic mutation for hereditary nonpolyposis colorectal cancer (HNPCC)    Family history of lung cancer    Family history of cancer of male genital organ    Family history of stomach cancer    Family history of pancreatic cancer    Family history of prostate cancer    Dizziness 01/25/2019   Vocal cord cancer ([place]) 10/16/2018   Squamous cell skin cancer 10/16/2018   GERD (gastroesophageal reflux disease) 10/16/2018   Chronic hoarseness 10/16/2018   Hyperglycemia 10/16/2018   S/P left UKR 02/12/2016   Status post unilateral knee replacement 02/12/2016   External hemorrhoid, bleeding 08/11/2014   Trochanteric bursitis or tendonitis 09/28/2013   Vocal cord nodule 09/01/2013   Chondromalacia patellae 05/31/2013   Greater trochanteric bursitis of left hip 12/23/2012   Degenerative tear of medial meniscus 07/01/2012   Prepatellar bursitis 04/01/2012   Left knee pain 01/23/2012   HOARSENESS 06/20/2010   RECTAL BLEEDING 02/05/2010  Allergic rhinitis 10/04/2009   Melanoma of skin ([place]) 12/30/2007   ADENOCARCINOMA, PROSTATE 12/30/2007   CHRONIC GRANULOMATOUS DISEASE 12/30/2007   Essential hypertension 12/30/2007   ASTHMATIC BRONCHITIS, ACUTE 12/30/2007   DIVERTICULOSIS OF COLON 12/30/2007   RENAL CYST 12/30/2007   DEGENERATIVE DISC DISEASE 12/30/2007   HYPERCHOLESTEROLEMIA 12/22/2007    ONSET DATE: August 2023   REFERRING DIAG: Vocal cord cancer  THERAPY DIAG:  Dysphagia, unspecified type  Rationale for Evaluation and Treatment Rehabilitation  SUBJECTIVE:   SUBJECTIVE  STATEMENT: Pt has mod hoarse voice. Is not eating dense foods, and having more liquid with most foods. Pt accompanied by:  two daughters  PERTINENT HISTORY:  Vocal cord cancer, stage II (T2, N0, M0). 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 Since that time he has been followed by Dr. Wilburn Cornelia every 6 months. 06/05/2022 He followed up with Dr. Wilburn Cornelia and reported several weeks of worsening hoarseness and mild vocal discomfort. Laryngoscopy performed during this visit revealed a nodular raised area in the anterior aspect of the right vocal cord. 06/26/22 he underwent vocal cord biopsies with Dr. Wilburn Cornelia. He was found to have 2 mm broad-based papillomatous mass involving the anterior one third of the right true vocal cord and extending onto the superior surface. Pathology from the procedure revealed invasive well-differentiated squamous cell carcinoma. 07/04/22 CT neck revealed no abnormal soft tissue mass or enhancement along the right true vocal fold to suggest residual disease s/p excision. CT also showed no evidence of pathologic lymphadenopathy in the neck. He will receive 20 fractions of radiation to his Larynx.  He starts on 07/29/22 and will complete on 08/23/22.  PAIN:  Are you having pain? No  FALLS: Has patient fallen in last 6 months?  No  LIVING ENVIRONMENT: Lives with: lives with their spouse Lives in: House/apartment  PLOF:  Level of assistance: Independent with ADLs Employment: Retired   PATIENT GOALS Maintain WNL swallow  OBJECTIVE:   DIAGNOSTIC FINDINGS:  February 11 2019- IMPRESSION: 1. No acute intracranial abnormality. 2. 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 3. Minimal chronic small vessel ischemic disease in the cerebral white matter. 4. Mild-to-moderate cerebral atrophy.   COGNITION: Overall cognitive status: Within functional limits for  tasks assessed   LANGUAGE: Receptive and Expressive language appeared WNL.  ORAL MOTOR EXAMINATION Overall status: WFL  MOTOR SPEECH: Overall motor speech: Appears intact Level of impairment: Word Respiration: thoracic breathing and clavicular breathing Phonation: breathy and hoarse Resonance: WFL Articulation: Appears intact Intelligibility: Intelligible Motor planning: Appears intact   CLINICAL SWALLOW ASSESSMENT:   Current diet: Dysphagia 3 (mechanical soft), thin liquids, and current diet modifications: pt does not eat "dense" foods as often as he used to Objective swallow impairments: needs liquid wash at times for pharyngeal clearance Objective recommended compensations: use water/liquid PRN to moisten bolus to enable swallowing Dentition: adequate natural dentition Patient directly observed with POs: Yes: dysphagia 3 (soft), dysphagia 1 (puree), and thin liquids  Feeding: able to feed self Liquids provided by: straw Oral phase signs and symptoms:  nothing noted Pharyngeal phase signs and symptoms: multiple swallows and complaints of residue   TODAY'S TREATMENT:  Research states the risk for dysphagia increases due to radiation and/or chemotherapy treatment due to a variety of factors, so SLP educated the pt about the possibility of reduced/limited ability for PO intake during rad tx. SLP also educated pt regarding possible changes to swallowing musculature  after rad tx, and why adherence to dysphagia HEP provided today and PO consumption was necessary to inhibit muscle fibrosis following rad tx and to mitigate muscle disuse atrophy. SLP informed pt why this would be detrimental to their swallowing status and to their pulmonary health. Pt demonstrated understanding of these things to SLP. SLP encouraged pt to safely eat and drink as deep into their radiation/chemotherapy as possible to provide the best possible long-term swallowing outcome for pt.    SLP then developed an  individualized HEP for pt involving oral and pharyngeal and vocal  strengthening and ROM and pt was instructed how to perform these exercises, including SLP demonstration. After SLP demonstration, pt return demonstrated each exercise. SLP ensured pt performance was correct prior to educating pt on next exercise. Pt required rare min-mod cues faded to modified independent to perform HEP. Pt was instructed to complete this program 6-7 days/week, at least 2 times a day until 6 months after his last day of rad tx, and then x2 a week after that, indefinitely.   PATIENT EDUCATION: Education details: late effects head/neck radiation on swallow function, HEP procedure, and modification to HEP when difficulty experienced with swallowing during and after radiation course Person educated: Patient Education method: Explanation, Demonstration, Verbal cues, and Handouts Education comprehension: verbalized understanding, returned demonstration, verbal cues required, and needs further education   ASSESSMENT:  CLINICAL IMPRESSION: Patient is a 88 y.o. male who was seen today for assessment of swallowing as they undergo radiation/chemoradiation therapy. Mod hoarseness exhibited. Pt normally eats much of a regular diet but is limiting/not eating dense foods at this time, and is using liquid wash with many of his POs. Today pt ate items from [facility], and drank thin liquids. POs without overt s/s oral or pharyngeal difficulty noted with these items. At this time pt swallowing is deemed WNL/WFL with these POs. No oral or overt s/sx pharyngeal deficits, including aspiration were observed. There are no overt s/s aspiration PNA observed by SLP nor any reported by pt at this time. Data indicate that pt's swallow ability will likely decrease over the course of radiation therapy and could very well decline over time following the conclusion of that therapy due to muscle disuse atrophy and/or muscle fibrosis. Pt will cont to need to  be seen by SLP in order to assess safety of PO intake, assess the need for recommending any objective swallow assessment, and ensuring pt is correctly completing the individualized HEP.  OBJECTIVE IMPAIRMENTS include voice disorder and dysphagia. These impairments are limiting patient from effectively communicating at home and in community and safety when swallowing. Factors affecting potential to achieve goals and functional outcome are  none . Patient will benefit from skilled SLP services to address above impairments and improve overall function.  REHAB POTENTIAL: Good   GOALS: Goals reviewed with patient? No  SHORT TERM GOALS: Target:  3 sessions    (4th therapy session)  pt will complete HEP with rare min A  Baseline: Goal status: Initial   pt will tell SLP why pt is completing HEP with modified independence Baseline:  Goal status: Initial  pt will describe 3 overt s/s aspiration PNA with modified independence Baseline:  Goal status: Initial  Pt will tell SLP how a food journal could hasten return to a more normalized diet Baseline:  Goal status: Initial  LONG TERM GOALS: Target:  6 sessions   (7th session)  pt will complete HEP with modified independence over two visits Baseline:  Goal  status: Initial   2.  pt will describe how to modify HEP over time, and the timeline associated with reduction in HEP frequency with modified independence over two sessions Baseline:  Goal status: Initial  PLAN: SLP FREQUENCY:  once approx every four weeks  SLP DURATION: 6 months  PLANNED INTERVENTIONS: Aspiration precaution training, Pharyngeal strengthening exercises, Diet toleration management , Environmental controls, Trials of upgraded texture/liquids, Cueing hierachy, SLP instruction and feedback, Compensatory strategies, and Patient/family education    [facility], [place] 07/31/2022, 12:16 AM

## 2022-07-30 NOTE — Therapy (Signed)
OUTPATIENT PHYSICAL THERAPY HEAD AND NECK BASELINE EVALUATION   Patient Name: Darren Edwards MRN: [id number] DOB:Sep 23, 1929, 88 y.o., male Today's Date: 07/30/2022   PT End of Session - 07/30/22 1539     Visit Number 1    Number of Visits 2    Date for PT Re-Evaluation 09/24/22    PT Start Time 7829    PT Stop Time 1540    PT Time Calculation (min) 35 min    Activity Tolerance Patient tolerated treatment well    Behavior During Therapy WFL for tasks assessed/performed             Past Medical History:  Diagnosis Date   Arthritis    Knee   Cancer ([place])    HX SKIN CANCERS / HX PROSTATE CANCER   Family history of cancer of male genital organ    Family history of genetic mutation for hereditary nonpolyposis colorectal cancer (HNPCC)    Family history of lung cancer    Family history of pancreatic cancer    Family history of prostate cancer    Family history of stomach cancer    GERD (gastroesophageal reflux disease)    GERD (gastroesophageal reflux disease) 10/16/2018   Hypercholesteremia    Squamous cell skin cancer 10/16/2018   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 Deatra Ina   MICROLARYNGOSCOPY W/VOCAL CORD INJECTION Right 07/19/2016   Procedure: MICROLARYNGOSCOPY WITH EXCISION OF RIGHT  VOCAL CORD POLYP;  Surgeon: [name], MD;  Location[facility];  Service: ENT;  Laterality: Right;   MICROLARYNGOSCOPY WITH CO2 LASER AND EXCISION OF VOCAL CORD LESION Right 06/26/2022   Procedure: MICROLARYNGOSCOPY WITH VOCAL CORD [place] OF RIGHT CORD NODULE;  Surgeon: [name], MD;  Location: [place];  Service: ENT;  Laterality: Right;   PARTIAL KNEE ARTHROPLASTY Left 02/12/2016   Procedure: UNICOMPARTMENTAL LEFT KNEE MEDIALLY;  Surgeon: Paralee Cancel, MD;  Location: [facility];  Service: Orthopedics;  Laterality: Left;   PROSTATECTOMY     SKIN CANCER EXCISION      TONSILLECTOMY     Patient Active Problem List   Diagnosis Date Noted   Actinic keratosis 04/16/2022   Encounter for fitting and adjustment of hearing aid 04/16/2022   Personal history of malignant melanoma of skin 04/16/2022   HLD (hyperlipidemia) 04/16/2022   Ear fullness, right 02/05/2022   Muscle strain of right gluteal region 05/12/2020   Genetic testing 07/28/2019   Family history of genetic mutation for hereditary nonpolyposis colorectal cancer (HNPCC)    Family history of lung cancer    Family history of cancer of male genital organ    Family history of stomach cancer    Family history of pancreatic cancer    Family history of prostate cancer    Dizziness 01/25/2019   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   Squamous cell skin cancer 10/16/2018   GERD (gastroesophageal reflux disease) 10/16/2018   Chronic hoarseness 10/16/2018   Hyperglycemia 10/16/2018   S/P left UKR 02/12/2016   Status post unilateral knee replacement 02/12/2016   External hemorrhoid, bleeding 08/11/2014   Trochanteric bursitis or tendonitis 09/28/2013   Vocal cord nodule 09/01/2013   Chondromalacia patellae 05/31/2013   Greater trochanteric bursitis of left hip 12/23/2012   Degenerative tear of medial meniscus 07/01/2012   Prepatellar bursitis 04/01/2012   Left knee pain 01/23/2012   HOARSENESS 06/20/2010   RECTAL BLEEDING  02/05/2010   Allergic rhinitis 10/04/2009   Melanoma of skin ([place]) 12/30/2007   ADENOCARCINOMA, PROSTATE 12/30/2007   CHRONIC GRANULOMATOUS DISEASE 12/30/2007   Essential hypertension 12/30/2007   ASTHMATIC BRONCHITIS, ACUTE 12/30/2007   DIVERTICULOSIS OF COLON 12/30/2007   RENAL CYST 12/30/2007   DEGENERATIVE [place] DISEASE 12/30/2007   HYPERCHOLESTEROLEMIA 12/22/2007    PCP: [name], MD  REFERRING PROVIDER: [name], MD  REFERRING DIAG: C32.0 (ICD-10-CM) - Vocal cord cancer [facility])  THERAPY DIAG:  Abnormal posture  Malignant neoplasm of vocal cord  [facility])  Rationale for Evaluation and Treatment Rehabilitation  ONSET DATE: 06/05/22  SUBJECTIVE    SUBJECTIVE STATEMENT: Patient reports they are here today to be seen by their medical team for newly diagnosed cancer of vocal cord.    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 Since that time he has been followed by Dr. Wilburn Cornelia every 6 months. 06/05/2022 He followed up with Dr. Wilburn Cornelia and reported several weeks of worsening hoarseness and mild vocal discomfort. Laryngoscopy performed during this visit revealed a nodular raised area in the anterior aspect of the right vocal cord. 06/26/22 he underwent vocal cord biopsies with Dr. Wilburn Cornelia. He was found to have 2 mm broad-based papillomatous mass involving the anterior one third of the right true vocal cord and extending onto the superior surface. Pathology from the procedure revealed invasive well-differentiated squamous cell carcinoma.  07/04/22 CT neck revealed no abnormal soft tissue mass or enhancement along the right true vocal fold to suggest residual disease s/p excision. CT also showed no evidence of pathologic lymphadenopathy in the neck. He will receive 20 fractions of radiation to his Larynx.  He starts on 07/29/22 and will complete on 08/23/22.  PATIENT GOALS   to be educated about the signs and symptoms of lymphedema and learn post op HEP.   PAIN:  Are you having pain? No   PRECAUTIONS: Active CA and Joint replacement L TKA  WEIGHT BEARING RESTRICTIONS No  FALLS:  Has patient fallen in last 6 months? No Does the patient have a fear of falling that limits activity? No Is the patient reluctant to leave the house due to a fear of falling?No  LIVING ENVIRONMENT: Patient lives with: his wife, Gearl Baratta Lives in: House/apartment Has following equipment at  home: Lobbyist, Environmental consultant - 4 wheeled, and Grab bars  OCCUPATION: retired  LEISURE: pt reports he exercises 7 days/week, walks/runs 3 days/wk, 3 days/wk uses a [employer] stationary bike, lifts weights, sit ups, push ups, planks  PRIOR LEVEL OF FUNCTION: Independent   OBJECTIVE  COGNITION:           Overall cognitive status: Within functional limits for tasks assessed                  POSTURE:  Forward head and rounded shoulders posture   30 SEC SIT TO STAND: 16 reps in 30 sec without use of UEs which is  Excellent for patient's age   SHOULDER AROM:   WFL    CERVICAL AROM:   Percent limited  Flexion WFL  Extension 25% limited  Right lateral flexion WFL  Left lateral flexion WFL  Right rotation WFL  Left rotation WFL    (Blank rows=not tested)   GAIT:  Assessed: Yes Assistance needed: Independent  Ambulation Distance: 15 feet  Assistive  Device: none Gait pattern: WFL Ambulation surface: Level  PATIENT EDUCATION:  Education details: Neck ROM, importance of posture when sitting, standing and lying down, deep breathing, walking program and importance of staying active throughout treatment, CURE article on staying active, "Why exercise?" flyer, lymphedema and PT info Person educated: Patient Education method: Explanation, Demonstration, Handout Education comprehension: Patient verbalized understanding and returned demonstration   HOME EXERCISE PROGRAM: Patient was instructed today in a home exercise program today for head and neck range of motion exercises. These included active cervical flexion, active cervical extension, active cervical rotation to each direction, upper trap stretch, and shoulder retraction. Patient was encouraged to do these 2-3 times a day, holding for 5 sec each and completing for 5 reps. Pt was educated that once this becomes easier then hold the stretches for 30-60 seconds.    ASSESSMENT:  CLINICAL IMPRESSION: Pt arrives to PT with  recently diagnosed vocal cord cancer. Pt will undergo 20 fractions of radiation to his larynx. Pt will start treatment on on 07/29/22 and will complete on 08/23/22.  Pt's cervical ROM was [facility]. Educated pt about signs and symptoms of lymphedema as well as anatomy and physiology of lymphatic system. Educated pt in importance of staying as active as possible throughout treatment to decrease fatigue as well as head and neck ROM exercises to decrease loss of ROM. Will see pt after completion of radiation to reassess ROM and assess for lymphedema and to determine therapy needs at that time.  Pt will benefit from skilled therapeutic intervention to improve on the following deficits: Decreased knowledge of precautions and postural dysfunction.   PT treatment/interventions: ADL/self-care home management, pt/family education, therapeutic exercise. Other interventions   REHAB POTENTIAL: Excellent  CLINICAL DECISION MAKING: Stable/uncomplicated  EVALUATION COMPLEXITY: Low   GOALS: Goals reviewed with patient? YES  LONG TERM GOALS: (STG=LTG)   Name Target Date  Goal status  1 Patient will be able to verbalize understanding of a home exercise program for cervical range of motion, posture, and walking.   Baseline:  No knowledge 07/30/2022 Achieved at eval  2 Patient will be able to verbalize understanding of proper sitting and standing posture. Baseline:  No knowledge 07/30/2022 Achieved at eval  3 Patient will be able to verbalize understanding of lymphedema risk and availability of treatment for this condition Baseline:  No knowledge 07/30/2022 Achieved at eval  4 Pt will demonstrate a return to full cervical ROM and function post operatively compared to baselines and not demonstrate any signs or symptoms of lymphedema.  Baseline: See objective measurements taken today. 09/24/2022 New     PLAN: PT FREQUENCY/DURATION: EVAL and 1 follow up appointment.   PLAN FOR NEXT SESSION: will reassess 2 weeks  after completion of radiation to determine needs.  Patient will follow up at outpatient cancer rehab 2 weeks after completion of radiation.  If the patient requires physical therapy at that time, a specific plan will be dictated and sent to the referring physician for approval. The patient was educated today on appropriate basic range of motion exercises to begin now and continue throughout radiation and educated on the signs and symptoms of lymphedema. Patient verbalized good understanding.     Physical Therapy Information for During and After Head/Neck Cancer Treatment: Lymphedema is a swelling condition that you may be at risk for in your neck and/or face if you have radiation treatment to the area and/or if you have surgery that includes removing lymph nodes.  There is treatment available for this  condition and it is not life-threatening.  Contact your physician or physical therapist with concerns. An excellent resource for those seeking information on lymphedema is the National Lymphedema Network's website.  It can be accessed at [place].org If you notice swelling in your neck or face at any time following surgery (even if it is many years from now), please contact your doctor or physical therapist to discuss this.  Lymphedema can be treated at any time but it is easier for you if it is treated early on. If you have had surgery to your neck, please check with your surgeon about how soon to start doing neck range of motion exercises.  If you are not having surgery, I encourage you to start doing neck range of motion exercises today and continue these while undergoing treatment, UNLESS you have irritation of your skin or soft tissue that is aggravated by doing them.  These exercises are intended to help you prevent loss of range of motion and/or to gain range of motion in your neck (which can be limited by tightening effects of radiation), and NOT to aggravate these tissues if they develop  sensitivities from treatment. Neck range of motion exercises should be done to the point of feeling a GENTLE, TOLERABLE stretch only.  You are encouraged to start a walking or other exercise program tomorrow and continue this as much as you are able through and after treatment.  Please feel free to call me with any questions. Manus Gunning, PT, CLT Physical Therapist and Certified Lymphedema Therapist [facility] [address], [address], [place],  [address] [phone] [email or web address]  WALKING  Walking is a great form of exercise to increase your strength, endurance and overall fitness.  A walking program can help you start slowly and gradually build endurance as you go.  Everyone's ability is different, so each person's starting point will be different.  You do not have to follow them exactly.  The are just samples. You should simply find out what's right for you and stick to that program.   In the beginning, you'll start off walking 2-3 times a day for short distances.  As you get stronger, you'll be walking further at just 1-2 times per day.  A. You Can Walk For A Certain Length Of Time Each Day    Walk 5 minutes 3 times per day.  Increase 2 minutes every 2 days (3 times per day).  Work up to 25-30 minutes (1-2 times per day).   Example:   Day 1-2 5 minutes 3 times per day   Day 7-8 12 minutes 2-3 times per day   Day 13-14 25 minutes 1-2 times per day  B. You Can Walk For a Certain Distance Each Day     Distance can be substituted for time.    Example:   3 trips to mailbox (at road)   3 trips to corner of block   3 trips around the block  C. Go to local high school and use the track.    Walk for distance ____ around track  Or time ____ minutes  D. Walk ____ Jog ____ Run ___   Why exercise?  So many benefits! Here are SOME of them: Heart health, including raising your good cholesterol level and reducing heart rate  and blood pressure Lung health, including improved lung capacity It burns fats, and most of [place] can stand to be leaner, whether or not we are overweight. It increases the body's natural  painkillers and mood elevators, so makes you feel better. Not only makes you feel better, but look better too Improves sleep Takes a bite out of stress May decrease your risk of many types of cancer If you are currently undergoing cancer treatment, exercise may improve your ability to tolerate treatments including chemotherapy. For everybody, it can improve your energy level. Those with cancer-related fatigue report a 40-50% reduction in this symptom when exercising regularly. If you are a survivor of breast, colon, or prostate cancer, it may decrease your risk of a recurrence. (This may hold for other cancers too, but so far we have data just for these three types.)  How to exercise: Get your doctor's okay. Pick something you enjoy doing, like walking, Zumba, biking, swimming, or whatever. Start at low intensity and time, then gradually increase.  (See walking program handout.) Set a goal to achieve over time.  The American Cancer Society, American Heart Association, and U.S. Dept. of Health and Human Services recommend 150 minutes of moderate exercise, 75 minutes of vigorous exercise, or a combination of both per week. This should be done in episodes at least 10 minutes long, spread throughout the week.  Need help being motivated? Pick something you enjoy doing, because you'll be more inclined to stick with that activity than something that feels like a chore. Do it with a friend so that you are accountable to each other. Schedule it into your day. Place it on your calendar and keep that appointment just like you do any appointment that you make. Join an exercise group that meets at a specific time.  That way, you have to show up on time, and that makes it harder to procrastinate about doing your workout.  It  also keeps you accountable--people begin to expect you to be there. Join a gym where you feel comfortable and not intimidated, at the right cost. Sign up for something that you'll need to be in shape for on a specific date, like a 1K or a 5K to walk or run, a 20 or 30 mile bike ride, a mud run or something like that. If the date is looming, you know you'll need to train to be ready for it.  An added benefit is that many of these are fundraisers for good causes. If you've already paid for a gym membership, group exercise class or event, you might as well work out, so you haven't wasted your money!    Allyson Sabal [place], PT 07/30/2022, 3:40 PM

## 2022-07-31 ENCOUNTER — Ambulatory Visit
Admission: RE | Admit: 2022-07-31 | Discharge: 2022-07-31 | Disposition: A | Discharge status: Home / Self Care | Payer: Medicare Other | Source: Ambulatory Visit | Attending: Radiation Oncology | Admitting: Radiation Oncology

## 2022-07-31 ENCOUNTER — Other Ambulatory Visit: Payer: Self-pay

## 2022-07-31 ENCOUNTER — Ambulatory Visit: Payer: Medicare Other

## 2022-07-31 DIAGNOSIS — Z510 Encounter for antineoplastic radiation therapy: Secondary | ICD-10-CM | POA: Diagnosis not present

## 2022-07-31 DIAGNOSIS — C320 Malignant neoplasm of glottis: Secondary | ICD-10-CM | POA: Diagnosis not present

## 2022-07-31 LAB — RAD ONC ARIA SESSION SUMMARY
Course Elapsed Days: 2
Plan Fractions Treated to Date: 3
Plan Prescribed Dose Per Fraction: 2.7 Gy
Plan Total Fractions Prescribed: 20
Plan Total Prescribed Dose: 54 Gy
Reference Point Dosage Given to Date: 8.1 Gy
Reference Point Session Dosage Given: 2.7 Gy
Session Number: 3

## 2022-07-31 NOTE — Patient Instructions (Signed)
SWALLOWING EXERCISES Do these 6/7 days/week, until 6 months after your last day of radiation, then 2 times per week afterwards  Effortful Swallows - Press your tongue against the roof of your mouth for 3 seconds, then squeeze the muscles in your neck while you swallow your saliva or a sip of water - Repeat 10-15 times, 2-3 times a day, and use whenever you eat or drink  [place] Swallow - swallow with your tongue sticking out - Stick tongue out past your lips and gently bite tongue with your teeth - Swallow, while holding your tongue with your teeth - Repeat 10-15 times, 2-3 times a day *use a wet spoon if your mouth gets dry*  Pitch Raise - Repeat "he", once per second in as high of a pitch as you can - Repeat 20 times, 2-3 times a day  [employer] - "half swallow" exercise - Start to swallow, and keep your Adam's apple up by squeezing hard with the muscles of the throat - Hold the squeeze for 5-7 seconds and then relax - Repeat 10-15 times, 2-3 times a day *use a wet spoon if your mouth gets dry*

## 2022-08-01 ENCOUNTER — Other Ambulatory Visit: Payer: Self-pay

## 2022-08-01 ENCOUNTER — Ambulatory Visit
Admission: RE | Admit: 2022-08-01 | Discharge: 2022-08-01 | Disposition: A | Discharge status: Home / Self Care | Payer: Medicare Other | Source: Ambulatory Visit | Attending: Radiation Oncology | Admitting: Radiation Oncology

## 2022-08-01 ENCOUNTER — Ambulatory Visit: Payer: Medicare Other

## 2022-08-01 DIAGNOSIS — C320 Malignant neoplasm of glottis: Secondary | ICD-10-CM | POA: Diagnosis not present

## 2022-08-01 DIAGNOSIS — Z510 Encounter for antineoplastic radiation therapy: Secondary | ICD-10-CM | POA: Diagnosis not present

## 2022-08-01 LAB — RAD ONC ARIA SESSION SUMMARY
Course Elapsed Days: 3
Plan Fractions Treated to Date: 4
Plan Prescribed Dose Per Fraction: 2.7 Gy
Plan Total Fractions Prescribed: 20
Plan Total Prescribed Dose: 54 Gy
Reference Point Dosage Given to Date: 10.8 Gy
Reference Point Session Dosage Given: 2.7 Gy
Session Number: 4

## 2022-08-02 ENCOUNTER — Other Ambulatory Visit: Payer: Self-pay

## 2022-08-02 ENCOUNTER — Ambulatory Visit
Admission: RE | Admit: 2022-08-02 | Discharge: 2022-08-02 | Disposition: A | Discharge status: Home / Self Care | Payer: Medicare Other | Source: Ambulatory Visit | Attending: Radiation Oncology | Admitting: Radiation Oncology

## 2022-08-02 ENCOUNTER — Ambulatory Visit: Payer: Medicare Other

## 2022-08-02 DIAGNOSIS — C320 Malignant neoplasm of glottis: Secondary | ICD-10-CM | POA: Diagnosis not present

## 2022-08-02 DIAGNOSIS — Z510 Encounter for antineoplastic radiation therapy: Secondary | ICD-10-CM | POA: Diagnosis not present

## 2022-08-02 LAB — RAD ONC ARIA SESSION SUMMARY
Course Elapsed Days: 4
Plan Fractions Treated to Date: 5
Plan Prescribed Dose Per Fraction: 2.7 Gy
Plan Total Fractions Prescribed: 20
Plan Total Prescribed Dose: 54 Gy
Reference Point Dosage Given to Date: 13.5 Gy
Reference Point Session Dosage Given: 2.7 Gy
Session Number: 5

## 2022-08-05 ENCOUNTER — Other Ambulatory Visit: Payer: Self-pay | Admitting: Radiation Oncology

## 2022-08-05 ENCOUNTER — Ambulatory Visit
Admission: RE | Admit: 2022-08-05 | Discharge: 2022-08-05 | Disposition: A | Discharge status: Home / Self Care | Payer: Medicare Other | Source: Ambulatory Visit | Attending: Radiation Oncology | Admitting: Radiation Oncology

## 2022-08-05 ENCOUNTER — Other Ambulatory Visit: Payer: Self-pay

## 2022-08-05 ENCOUNTER — Ambulatory Visit: Payer: Medicare Other

## 2022-08-05 DIAGNOSIS — C320 Malignant neoplasm of glottis: Secondary | ICD-10-CM | POA: Diagnosis not present

## 2022-08-05 DIAGNOSIS — Z510 Encounter for antineoplastic radiation therapy: Secondary | ICD-10-CM | POA: Diagnosis not present

## 2022-08-05 LAB — RAD ONC ARIA SESSION SUMMARY
Course Elapsed Days: 7
Plan Fractions Treated to Date: 6
Plan Prescribed Dose Per Fraction: 2.7 Gy
Plan Total Fractions Prescribed: 20
Plan Total Prescribed Dose: 54 Gy
Reference Point Dosage Given to Date: 16.2 Gy
Reference Point Session Dosage Given: 2.7 Gy
Session Number: 6

## 2022-08-05 MED ORDER — SONAFINE EX EMUL
1.0000 | Freq: Two times a day (BID) | CUTANEOUS | Status: DC
  Administered 2022-08-05: 1 via TOPICAL

## 2022-08-05 MED ORDER — LIDOCAINE VISCOUS HCL 2 % MT SOLN: OROMUCOSAL | 3 refills | Status: DC

## 2022-08-06 ENCOUNTER — Ambulatory Visit: Payer: Medicare Other

## 2022-08-06 ENCOUNTER — Other Ambulatory Visit: Payer: Self-pay

## 2022-08-06 ENCOUNTER — Ambulatory Visit
Admission: RE | Admit: 2022-08-06 | Discharge: 2022-08-06 | Disposition: A | Discharge status: Home / Self Care | Payer: Medicare Other | Source: Ambulatory Visit | Attending: Radiation Oncology | Admitting: Radiation Oncology

## 2022-08-06 DIAGNOSIS — C320 Malignant neoplasm of glottis: Secondary | ICD-10-CM | POA: Diagnosis not present

## 2022-08-06 DIAGNOSIS — Z510 Encounter for antineoplastic radiation therapy: Secondary | ICD-10-CM | POA: Diagnosis not present

## 2022-08-06 LAB — RAD ONC ARIA SESSION SUMMARY
Course Elapsed Days: 8
Plan Fractions Treated to Date: 7
Plan Prescribed Dose Per Fraction: 2.7 Gy
Plan Total Fractions Prescribed: 20
Plan Total Prescribed Dose: 54 Gy
Reference Point Dosage Given to Date: 18.9 Gy
Reference Point Session Dosage Given: 2.7 Gy
Session Number: 7

## 2022-08-07 ENCOUNTER — Other Ambulatory Visit: Payer: Self-pay

## 2022-08-07 ENCOUNTER — Ambulatory Visit
Admission: RE | Admit: 2022-08-07 | Discharge: 2022-08-07 | Disposition: A | Discharge status: Home / Self Care | Payer: Medicare Other | Source: Ambulatory Visit | Attending: Radiation Oncology | Admitting: Radiation Oncology

## 2022-08-07 ENCOUNTER — Ambulatory Visit: Payer: Medicare Other

## 2022-08-07 DIAGNOSIS — C320 Malignant neoplasm of glottis: Secondary | ICD-10-CM | POA: Diagnosis not present

## 2022-08-07 DIAGNOSIS — Z510 Encounter for antineoplastic radiation therapy: Secondary | ICD-10-CM | POA: Diagnosis not present

## 2022-08-07 LAB — RAD ONC ARIA SESSION SUMMARY
Course Elapsed Days: 9
Plan Fractions Treated to Date: 8
Plan Prescribed Dose Per Fraction: 2.7 Gy
Plan Total Fractions Prescribed: 20
Plan Total Prescribed Dose: 54 Gy
Reference Point Dosage Given to Date: 21.6 Gy
Reference Point Session Dosage Given: 2.7 Gy
Session Number: 8

## 2022-08-08 ENCOUNTER — Other Ambulatory Visit: Payer: Self-pay

## 2022-08-08 ENCOUNTER — Ambulatory Visit: Payer: Medicare Other

## 2022-08-08 ENCOUNTER — Ambulatory Visit
Admission: RE | Admit: 2022-08-08 | Discharge: 2022-08-08 | Disposition: A | Discharge status: Home / Self Care | Payer: Medicare Other | Source: Ambulatory Visit | Attending: Radiation Oncology | Admitting: Radiation Oncology

## 2022-08-08 DIAGNOSIS — Z510 Encounter for antineoplastic radiation therapy: Secondary | ICD-10-CM | POA: Diagnosis not present

## 2022-08-08 DIAGNOSIS — C320 Malignant neoplasm of glottis: Secondary | ICD-10-CM | POA: Diagnosis not present

## 2022-08-08 LAB — RAD ONC ARIA SESSION SUMMARY
Course Elapsed Days: 10
Plan Fractions Treated to Date: 9
Plan Prescribed Dose Per Fraction: 2.7 Gy
Plan Total Fractions Prescribed: 20
Plan Total Prescribed Dose: 54 Gy
Reference Point Dosage Given to Date: 24.3 Gy
Reference Point Session Dosage Given: 2.7 Gy
Session Number: 9

## 2022-08-09 ENCOUNTER — Ambulatory Visit: Payer: Medicare Other

## 2022-08-09 ENCOUNTER — Other Ambulatory Visit: Payer: Self-pay

## 2022-08-09 ENCOUNTER — Ambulatory Visit
Admission: RE | Admit: 2022-08-09 | Discharge: 2022-08-09 | Disposition: A | Discharge status: Home / Self Care | Payer: Medicare Other | Source: Ambulatory Visit | Attending: Radiation Oncology | Admitting: Radiation Oncology

## 2022-08-09 DIAGNOSIS — C320 Malignant neoplasm of glottis: Secondary | ICD-10-CM | POA: Diagnosis not present

## 2022-08-09 DIAGNOSIS — Z510 Encounter for antineoplastic radiation therapy: Secondary | ICD-10-CM | POA: Diagnosis not present

## 2022-08-09 LAB — RAD ONC ARIA SESSION SUMMARY
Course Elapsed Days: 11
Plan Fractions Treated to Date: 10
Plan Prescribed Dose Per Fraction: 2.7 Gy
Plan Total Fractions Prescribed: 20
Plan Total Prescribed Dose: 54 Gy
Reference Point Dosage Given to Date: 27 Gy
Reference Point Session Dosage Given: 2.7 Gy
Session Number: 10

## 2022-08-12 ENCOUNTER — Ambulatory Visit
Admission: RE | Admit: 2022-08-12 | Discharge: 2022-08-12 | Disposition: A | Discharge status: Home / Self Care | Payer: Medicare Other | Source: Ambulatory Visit | Attending: Radiation Oncology | Admitting: Radiation Oncology

## 2022-08-12 ENCOUNTER — Ambulatory Visit: Payer: Medicare Other

## 2022-08-12 ENCOUNTER — Other Ambulatory Visit: Payer: Self-pay

## 2022-08-12 DIAGNOSIS — C320 Malignant neoplasm of glottis: Secondary | ICD-10-CM | POA: Diagnosis not present

## 2022-08-12 DIAGNOSIS — Z510 Encounter for antineoplastic radiation therapy: Secondary | ICD-10-CM | POA: Diagnosis not present

## 2022-08-12 LAB — RAD ONC ARIA SESSION SUMMARY
Course Elapsed Days: 14
Plan Fractions Treated to Date: 11
Plan Prescribed Dose Per Fraction: 2.7 Gy
Plan Total Fractions Prescribed: 20
Plan Total Prescribed Dose: 54 Gy
Reference Point Dosage Given to Date: 29.7 Gy
Reference Point Session Dosage Given: 2.7 Gy
Session Number: 11

## 2022-08-13 ENCOUNTER — Ambulatory Visit: Payer: Medicare Other

## 2022-08-13 ENCOUNTER — Ambulatory Visit
Admission: RE | Admit: 2022-08-13 | Discharge: 2022-08-13 | Disposition: A | Discharge status: Home / Self Care | Payer: Medicare Other | Source: Ambulatory Visit | Attending: Radiation Oncology | Admitting: Radiation Oncology

## 2022-08-13 ENCOUNTER — Other Ambulatory Visit: Payer: Self-pay

## 2022-08-13 DIAGNOSIS — C320 Malignant neoplasm of glottis: Secondary | ICD-10-CM | POA: Diagnosis not present

## 2022-08-13 DIAGNOSIS — Z510 Encounter for antineoplastic radiation therapy: Secondary | ICD-10-CM | POA: Diagnosis not present

## 2022-08-13 LAB — RAD ONC ARIA SESSION SUMMARY
Course Elapsed Days: 15
Plan Fractions Treated to Date: 12
Plan Prescribed Dose Per Fraction: 2.7 Gy
Plan Total Fractions Prescribed: 20
Plan Total Prescribed Dose: 54 Gy
Reference Point Dosage Given to Date: 32.4 Gy
Reference Point Session Dosage Given: 2.7 Gy
Session Number: 12

## 2022-08-14 ENCOUNTER — Other Ambulatory Visit: Payer: Self-pay

## 2022-08-14 ENCOUNTER — Ambulatory Visit
Admission: RE | Admit: 2022-08-14 | Discharge: 2022-08-14 | Disposition: A | Discharge status: Home / Self Care | Payer: Medicare Other | Source: Ambulatory Visit | Attending: Radiation Oncology | Admitting: Radiation Oncology

## 2022-08-14 DIAGNOSIS — Z510 Encounter for antineoplastic radiation therapy: Secondary | ICD-10-CM | POA: Diagnosis not present

## 2022-08-14 DIAGNOSIS — C320 Malignant neoplasm of glottis: Secondary | ICD-10-CM | POA: Diagnosis not present

## 2022-08-14 LAB — RAD ONC ARIA SESSION SUMMARY
Course Elapsed Days: 16
Plan Fractions Treated to Date: 13
Plan Prescribed Dose Per Fraction: 2.7 Gy
Plan Total Fractions Prescribed: 20
Plan Total Prescribed Dose: 54 Gy
Reference Point Dosage Given to Date: 35.1 Gy
Reference Point Session Dosage Given: 2.7 Gy
Session Number: 13

## 2022-08-15 ENCOUNTER — Ambulatory Visit
Admission: RE | Admit: 2022-08-15 | Discharge: 2022-08-15 | Disposition: A | Discharge status: Home / Self Care | Payer: Medicare Other | Source: Ambulatory Visit | Attending: Radiation Oncology | Admitting: Radiation Oncology

## 2022-08-15 ENCOUNTER — Other Ambulatory Visit: Payer: Self-pay

## 2022-08-15 DIAGNOSIS — Z510 Encounter for antineoplastic radiation therapy: Secondary | ICD-10-CM | POA: Diagnosis not present

## 2022-08-15 DIAGNOSIS — C320 Malignant neoplasm of glottis: Secondary | ICD-10-CM | POA: Diagnosis not present

## 2022-08-15 LAB — RAD ONC ARIA SESSION SUMMARY
Course Elapsed Days: 17
Plan Fractions Treated to Date: 14
Plan Prescribed Dose Per Fraction: 2.7 Gy
Plan Total Fractions Prescribed: 20
Plan Total Prescribed Dose: 54 Gy
Reference Point Dosage Given to Date: 37.8 Gy
Reference Point Session Dosage Given: 2.7 Gy
Session Number: 14

## 2022-08-16 ENCOUNTER — Ambulatory Visit
Admission: RE | Admit: 2022-08-16 | Discharge: 2022-08-16 | Disposition: A | Discharge status: Home / Self Care | Payer: Medicare Other | Source: Ambulatory Visit | Attending: Radiation Oncology | Admitting: Radiation Oncology

## 2022-08-16 ENCOUNTER — Telehealth: Payer: Self-pay

## 2022-08-16 ENCOUNTER — Other Ambulatory Visit: Payer: Self-pay

## 2022-08-16 DIAGNOSIS — Z510 Encounter for antineoplastic radiation therapy: Secondary | ICD-10-CM | POA: Diagnosis not present

## 2022-08-16 DIAGNOSIS — C320 Malignant neoplasm of glottis: Secondary | ICD-10-CM | POA: Diagnosis not present

## 2022-08-16 LAB — RAD ONC ARIA SESSION SUMMARY
Course Elapsed Days: 18
Plan Fractions Treated to Date: 15
Plan Prescribed Dose Per Fraction: 2.7 Gy
Plan Total Fractions Prescribed: 20
Plan Total Prescribed Dose: 54 Gy
Reference Point Dosage Given to Date: 40.5 Gy
Reference Point Session Dosage Given: 2.7 Gy
Session Number: 15

## 2022-08-16 NOTE — Telephone Encounter (Signed)
Received a call from patient's wife Leda Gauze with an update on patient's behalf. She relayed that patient's voice is more hoarse, he has increased thick saliva/mucus,  and is starting to have some discomfort to the left side of his throat. She reports he doesn't have any trouble swallowing and states he's currently taking 650 mg of Tylenol every 8 hours as needed for the throat discomfort. She does report the back of his throat had a whitish film but now is more reddened in appearance. She states he's diligent about doing his SLP exercises and continues to eat/drink without difficulty. She confirmed that patient already has a prescription for viscous lidocaine, and we reviewed administration should patient need to utilize over the weekend. Reassured Leda Gauze that these side effects all sounded typical/expected given where patient is in his radiation course; and confirmed that patient would be seen by Dr. Isidore Moos this coming Monday after his treatment. Assured Leda Gauze that Dr. Isidore Moos would look inside patient's mouth/throat and assess his status/symptoms, making additional recommendations as she felt warranted. Leda Gauze verbalized understanding and appreciation of conversation, and denied any other needs at this time.

## 2022-08-19 ENCOUNTER — Ambulatory Visit: Payer: Medicare Other

## 2022-08-19 ENCOUNTER — Other Ambulatory Visit: Payer: Self-pay

## 2022-08-19 ENCOUNTER — Ambulatory Visit
Admission: RE | Admit: 2022-08-19 | Discharge: 2022-08-19 | Disposition: A | Discharge status: Home / Self Care | Payer: Medicare Other | Source: Ambulatory Visit | Attending: Radiation Oncology | Admitting: Radiation Oncology

## 2022-08-19 DIAGNOSIS — C320 Malignant neoplasm of glottis: Secondary | ICD-10-CM

## 2022-08-19 DIAGNOSIS — Z510 Encounter for antineoplastic radiation therapy: Secondary | ICD-10-CM | POA: Diagnosis not present

## 2022-08-19 LAB — RAD ONC ARIA SESSION SUMMARY
Course Elapsed Days: 21
Plan Fractions Treated to Date: 16
Plan Prescribed Dose Per Fraction: 2.7 Gy
Plan Total Fractions Prescribed: 20
Plan Total Prescribed Dose: 54 Gy
Reference Point Dosage Given to Date: 43.2 Gy
Reference Point Session Dosage Given: 2.7 Gy
Session Number: 16

## 2022-08-20 ENCOUNTER — Other Ambulatory Visit: Payer: Self-pay

## 2022-08-20 ENCOUNTER — Ambulatory Visit
Admission: RE | Admit: 2022-08-20 | Discharge: 2022-08-20 | Disposition: A | Discharge status: Home / Self Care | Payer: Medicare Other | Source: Ambulatory Visit | Attending: Radiation Oncology | Admitting: Radiation Oncology

## 2022-08-20 DIAGNOSIS — C320 Malignant neoplasm of glottis: Secondary | ICD-10-CM | POA: Diagnosis not present

## 2022-08-20 DIAGNOSIS — Z510 Encounter for antineoplastic radiation therapy: Secondary | ICD-10-CM | POA: Diagnosis not present

## 2022-08-20 LAB — RAD ONC ARIA SESSION SUMMARY
Course Elapsed Days: 22
Plan Fractions Treated to Date: 17
Plan Prescribed Dose Per Fraction: 2.7 Gy
Plan Total Fractions Prescribed: 20
Plan Total Prescribed Dose: 54 Gy
Reference Point Dosage Given to Date: 45.9 Gy
Reference Point Session Dosage Given: 2.7 Gy
Session Number: 17

## 2022-08-21 ENCOUNTER — Other Ambulatory Visit: Payer: Self-pay

## 2022-08-21 ENCOUNTER — Ambulatory Visit
Admission: RE | Admit: 2022-08-21 | Discharge: 2022-08-21 | Disposition: A | Discharge status: Home / Self Care | Payer: Medicare Other | Source: Ambulatory Visit | Attending: Radiation Oncology | Admitting: Radiation Oncology

## 2022-08-21 DIAGNOSIS — C320 Malignant neoplasm of glottis: Secondary | ICD-10-CM | POA: Diagnosis not present

## 2022-08-21 DIAGNOSIS — Z510 Encounter for antineoplastic radiation therapy: Secondary | ICD-10-CM | POA: Diagnosis not present

## 2022-08-21 LAB — RAD ONC ARIA SESSION SUMMARY
Course Elapsed Days: 23
Plan Fractions Treated to Date: 18
Plan Prescribed Dose Per Fraction: 2.7 Gy
Plan Total Fractions Prescribed: 20
Plan Total Prescribed Dose: 54 Gy
Reference Point Dosage Given to Date: 48.6 Gy
Reference Point Session Dosage Given: 2.7 Gy
Session Number: 18

## 2022-08-22 ENCOUNTER — Ambulatory Visit
Admission: RE | Admit: 2022-08-22 | Discharge: 2022-08-22 | Disposition: A | Discharge status: Home / Self Care | Payer: Medicare Other | Source: Ambulatory Visit | Attending: Radiation Oncology | Admitting: Radiation Oncology

## 2022-08-22 ENCOUNTER — Other Ambulatory Visit: Payer: Self-pay

## 2022-08-22 DIAGNOSIS — C320 Malignant neoplasm of glottis: Secondary | ICD-10-CM | POA: Diagnosis not present

## 2022-08-22 DIAGNOSIS — Z510 Encounter for antineoplastic radiation therapy: Secondary | ICD-10-CM | POA: Diagnosis not present

## 2022-08-22 LAB — RAD ONC ARIA SESSION SUMMARY
Course Elapsed Days: 24
Plan Fractions Treated to Date: 19
Plan Prescribed Dose Per Fraction: 2.7 Gy
Plan Total Fractions Prescribed: 20
Plan Total Prescribed Dose: 54 Gy
Reference Point Dosage Given to Date: 51.3 Gy
Reference Point Session Dosage Given: 2.7 Gy
Session Number: 19

## 2022-08-23 ENCOUNTER — Other Ambulatory Visit: Payer: Self-pay

## 2022-08-23 ENCOUNTER — Ambulatory Visit
Admission: RE | Admit: 2022-08-23 | Discharge: 2022-08-23 | Disposition: A | Discharge status: Home / Self Care | Payer: Medicare Other | Source: Ambulatory Visit | Attending: Radiation Oncology | Admitting: Radiation Oncology

## 2022-08-23 ENCOUNTER — Encounter: Payer: Self-pay | Admitting: Radiation Oncology

## 2022-08-23 ENCOUNTER — Telehealth (HOSPITAL_COMMUNITY): Payer: Self-pay

## 2022-08-23 DIAGNOSIS — C320 Malignant neoplasm of glottis: Secondary | ICD-10-CM | POA: Diagnosis not present

## 2022-08-23 DIAGNOSIS — Z510 Encounter for antineoplastic radiation therapy: Secondary | ICD-10-CM | POA: Diagnosis not present

## 2022-08-23 LAB — RAD ONC ARIA SESSION SUMMARY
Course Elapsed Days: 25
Plan Fractions Treated to Date: 20
Plan Prescribed Dose Per Fraction: 2.7 Gy
Plan Total Fractions Prescribed: 20
Plan Total Prescribed Dose: 54 Gy
Reference Point Dosage Given to Date: 54 Gy
Reference Point Session Dosage Given: 2.7 Gy
Session Number: 20

## 2022-08-23 NOTE — Progress Notes (Signed)
Oncology Nurse Navigator Documentation   [name] completed his radiation treatment today. He did well with treatment and will see Dr. Isidore Moos on 09/13/22 for his post treatment follow up appointment. Him and his family know to call me if they have any questions or concerns before then.   Harlow Asa RN, BSN, OCN Head & Neck Oncology Nurse [place] [facility] Phone # [phone]  Fax # [phone]

## 2022-08-23 NOTE — Telephone Encounter (Signed)
Attempted to contact patient to schedule Modified Barium Swallow - left voicemail. 

## 2022-08-26 ENCOUNTER — Other Ambulatory Visit (HOSPITAL_COMMUNITY): Payer: Self-pay | Admitting: *Deleted

## 2022-08-26 DIAGNOSIS — R059 Cough, unspecified: Secondary | ICD-10-CM

## 2022-08-26 DIAGNOSIS — R1310 Dysphagia, unspecified: Secondary | ICD-10-CM

## 2022-08-30 ENCOUNTER — Ambulatory Visit (HOSPITAL_COMMUNITY)
Admission: RE | Admit: 2022-08-30 | Discharge: 2022-08-30 | Disposition: A | Discharge status: Home / Self Care | Payer: Medicare Other | Source: Ambulatory Visit | Attending: Radiation Oncology | Admitting: Radiation Oncology

## 2022-08-30 DIAGNOSIS — R1310 Dysphagia, unspecified: Secondary | ICD-10-CM | POA: Insufficient documentation

## 2022-08-30 DIAGNOSIS — R059 Cough, unspecified: Secondary | ICD-10-CM | POA: Diagnosis not present

## 2022-08-30 DIAGNOSIS — C320 Malignant neoplasm of glottis: Secondary | ICD-10-CM

## 2022-08-30 DIAGNOSIS — C329 Malignant neoplasm of larynx, unspecified: Secondary | ICD-10-CM | POA: Insufficient documentation

## 2022-08-30 NOTE — Progress Notes (Signed)
Modified Barium Swallow Progress Note  Patient Details  Name: Darren Edwards MRN: [id number] Date of Birth: 06-10-1929  Today's Date: 08/30/2022  Modified Barium Swallow completed.  Full report located under Chart Review in the Imaging Section.  Brief recommendations include the following:  Clinical Impression  Pt presents with pharyngoesophageal dysphagia characterized by reduction in tongue base retraction, anterior laryngeal movement, pharyngeal stripping, laryngeal closure, and cricopharyngeal relaxation. Pt demonstrated vallecular residue, pyriform sinus residue, posterior pharyngeal residue and residue at the PES which were improved with secondary swallows and a liquid wash. Pt demonstrated penetration (PAS 3, 5) and aspiration (PAS 7) with consecutive swallows of thin liquids. Aspiration resulted in coughing which was effective in expelling aspirated material that was just below the vocal folds to the laryngeal surface of the epiglottis and occasionally out of the larynx. Aspiration was eliminated with use of individual boluses of thin liquids via cup and with pt's independent use of reduced bolus sizes. Aspiration (PAS 7) of thin liquids was most significant when pt swallowed the 110m barium tablet with thin liquids when the tablet was unable to pass the PES. This resulted in liquid residue in the pyriform sinuses which spilled into the larynx after the swallow. Transport of the barium tablet was not facilitated with repeated boluses of thin liquids or purees; the tablet was ultimately removed with pt's prompted coughing when it moved more superiorly to the hypopharynx. A dysphagia 3 diet with thin liquids is recommended at this time with observance of swallowing precautions and continued SLP dysphagia intervention. Pt was educated regarding the results of study and recommendations; imaging from the study was used to facilitate education and pt verbalized understanding as well as agreement.    Swallow Evaluation Recommendations       SLP Diet Recommendations: Dysphagia 3 (Mech soft) solids;Thin liquid   Liquid Administration via: Cup   Medication Administration: Crushed with puree       Compensations: Slow rate;Small sips/bites;Follow solids with liquid (avoid consecutive swallows)   Postural Changes: Seated upright at 90 degrees          [name] I. PHardin Negus MWorthington Hills CEaglevilleOffice number [phone]  SHorton Marshall11/12/2021,1:21 PM

## 2022-09-04 ENCOUNTER — Ambulatory Visit: Payer: Medicare Other | Attending: Radiation Oncology

## 2022-09-04 DIAGNOSIS — R490 Dysphonia: Secondary | ICD-10-CM | POA: Diagnosis not present

## 2022-09-04 DIAGNOSIS — C320 Malignant neoplasm of glottis: Secondary | ICD-10-CM | POA: Insufficient documentation

## 2022-09-04 DIAGNOSIS — R293 Abnormal posture: Secondary | ICD-10-CM | POA: Insufficient documentation

## 2022-09-04 DIAGNOSIS — R1310 Dysphagia, unspecified: Secondary | ICD-10-CM | POA: Diagnosis not present

## 2022-09-04 NOTE — Therapy (Addendum)
OUTPATIENT SPEECH LANGUAGE PATHOLOGY TREATMENT   Patient Name: Darren Edwards MRN: [id number] DOB:1929/04/26, 88 y.o., male Today's Date: 09/04/2022  PCP: Cathlean Cower, MD REFERRING PROVIDER: [name], MD   End of Session - 09/04/22 1705     Visit Number 2    Number of Visits 7    Date for SLP Re-Evaluation 10/28/22    SLP Start Time 25    SLP Stop Time  8381    SLP Time Calculation (min) 40 min    Activity Tolerance Patient tolerated treatment well              Past Medical History:  Diagnosis Date   Arthritis    Knee   Cancer ([place])    HX SKIN CANCERS / HX PROSTATE CANCER   Family history of cancer of male genital organ    Family history of genetic mutation for hereditary nonpolyposis colorectal cancer (HNPCC)    Family history of lung cancer    Family history of pancreatic cancer    Family history of prostate cancer    Family history of stomach cancer    GERD (gastroesophageal reflux disease)    GERD (gastroesophageal reflux disease) 10/16/2018   Hypercholesteremia    Squamous cell skin cancer 10/16/2018   Vocal cord cancer ([place]) 10/16/2018   Superficially invasive squamous cell    Past Surgical History:  Procedure Laterality Date   COLONOSCOPY  2011   w/Dr. Deatra Ina   MICROLARYNGOSCOPY W/VOCAL CORD INJECTION Right 07/19/2016   Procedure: MICROLARYNGOSCOPY WITH EXCISION OF RIGHT  VOCAL CORD POLYP;  Surgeon: [name], MD;  Location: [facility];  Service: ENT;  Laterality: Right;   MICROLARYNGOSCOPY WITH CO2 LASER AND EXCISION OF VOCAL CORD LESION Right 06/26/2022   Procedure: MICROLARYNGOSCOPY WITH VOCAL CORD [facility] OF RIGHT CORD NODULE;  Surgeon: [name], MD;  Location: [place];  Service: ENT;  Laterality: Right;   PARTIAL KNEE ARTHROPLASTY Left 02/12/2016   Procedure: UNICOMPARTMENTAL LEFT KNEE MEDIALLY;  Surgeon: Paralee Cancel, MD;  Location: [facility];  Service: Orthopedics;  Laterality: Left;   PROSTATECTOMY     SKIN CANCER EXCISION      TONSILLECTOMY     Patient Active Problem List   Diagnosis Date Noted   Actinic keratosis 04/16/2022   Encounter for fitting and adjustment of hearing aid 04/16/2022   Personal history of malignant melanoma of skin 04/16/2022   HLD (hyperlipidemia) 04/16/2022   Ear fullness, right 02/05/2022   Muscle strain of right gluteal region 05/12/2020   Genetic testing 07/28/2019   Family history of genetic mutation for hereditary nonpolyposis colorectal cancer (HNPCC)    Family history of lung cancer    Family history of cancer of male genital organ    Family history of stomach cancer    Family history of pancreatic cancer    Family history of prostate cancer    Dizziness 01/25/2019   Vocal cord cancer ([place]) 10/16/2018   Squamous cell skin cancer 10/16/2018   GERD (gastroesophageal reflux disease) 10/16/2018   Chronic hoarseness 10/16/2018   Hyperglycemia 10/16/2018   S/P left UKR 02/12/2016   Status post unilateral knee replacement 02/12/2016   External hemorrhoid, bleeding 08/11/2014   Trochanteric bursitis or tendonitis 09/28/2013   Vocal cord nodule 09/01/2013   Chondromalacia patellae 05/31/2013   Greater trochanteric bursitis of left hip 12/23/2012   Degenerative tear of medial meniscus 07/01/2012   Prepatellar bursitis 04/01/2012   Left knee pain 01/23/2012   HOARSENESS 06/20/2010   RECTAL BLEEDING 02/05/2010  Allergic rhinitis 10/04/2009   Melanoma of skin ([place]) 12/30/2007   ADENOCARCINOMA, PROSTATE 12/30/2007   CHRONIC GRANULOMATOUS DISEASE 12/30/2007   Essential hypertension 12/30/2007   ASTHMATIC BRONCHITIS, ACUTE 12/30/2007   DIVERTICULOSIS OF COLON 12/30/2007   RENAL CYST 12/30/2007   DEGENERATIVE DISC DISEASE 12/30/2007   HYPERCHOLESTEROLEMIA 12/22/2007    ONSET DATE: August 2023   REFERRING DIAG: Vocal cord cancer  THERAPY DIAG:  Dysphagia, unspecified type  Hoarseness  Rationale for Evaluation and Treatment Rehabilitation  SUBJECTIVE:   SUBJECTIVE  STATEMENT: Pt has mod hoarse voice. Is not eating dense foods, and having more liquid with most foods. Pt accompanied by:  two daughters  PERTINENT HISTORY:  Vocal cord cancer, stage II (T2, N0, M0). 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 Since that time he has been followed by Dr. Wilburn Cornelia every 6 months. 06/05/2022 He followed up with Dr. Wilburn Cornelia and reported several weeks of worsening hoarseness and mild vocal discomfort. Laryngoscopy performed during this visit revealed a nodular raised area in the anterior aspect of the right vocal cord. 06/26/22 he underwent vocal cord biopsies with Dr. Wilburn Cornelia. He was found to have 2 mm broad-based papillomatous mass involving the anterior one third of the right true vocal cord and extending onto the superior surface. Pathology from the procedure revealed invasive well-differentiated squamous cell carcinoma. 07/04/22 CT neck revealed no abnormal soft tissue mass or enhancement along the right true vocal fold to suggest residual disease s/p excision. CT also showed no evidence of pathologic lymphadenopathy in the neck. He will receive 20 fractions of radiation to his Larynx.  He starts on 07/29/22 and will complete on 08/23/22.  PAIN:  Are you having pain? No  FALLS: Has patient fallen in last 6 months?  No  LIVING ENVIRONMENT: Lives with: lives with their spouse Lives in: House/apartment  PLOF:  Level of assistance: Independent with ADLs Employment: Retired   PATIENT GOALS Maintain WNL swallow  OBJECTIVE:   DIAGNOSTIC FINDINGS:  February 11 2019- IMPRESSION: 1. No acute intracranial abnormality. 2. 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 3. Minimal chronic small vessel ischemic disease in the cerebral white matter. 4. Mild-to-moderate cerebral atrophy.   COGNITION: Overall cognitive status: Within functional limits for  tasks assessed   LANGUAGE: Receptive and Expressive language appeared WNL.  ORAL MOTOR EXAMINATION Overall status: WFL  MOTOR SPEECH: Overall motor speech: Appears intact Level of impairment: Word Respiration: thoracic breathing and clavicular breathing Phonation: breathy and hoarse Resonance: WFL Articulation: Appears intact Intelligibility: Intelligible Motor planning: Appears intact   CLINICAL SWALLOW ASSESSMENT:   Current diet: Dysphagia 3 (mechanical soft), thin liquids, and current diet modifications: pt does not eat "dense" foods as often as he used to Objective swallow impairments: needs liquid wash at times for pharyngeal clearance Objective recommended compensations: use water/liquid PRN to moisten bolus to enable swallowing Dentition: adequate natural dentition Patient directly observed with POs: Yes: dysphagia 3 (soft), dysphagia 1 (puree), and thin liquids  Feeding: able to feed self Liquids provided by: straw Oral phase signs and symptoms:  nothing noted Pharyngeal phase signs and symptoms: multiple swallows and complaints of residue   TODAY'S TREATMENT:  09/04/22: Pt arrived today in good spirits appreciative of MBS last week. He has continued performing the HEP throughout his rad tx and afterwards. Franchot Mimes told SLP the reason for his HEP, and completed the HEP with rare min A (hold point for [employer]).  SLP reviewed his MBS with him and wife. Pt followed precautions from [employer] [place] with independence. "I told her I think my chug-a-lug days are over, Glendell Docker," pt stated. SLP told pt if he cont with HEP his chug-a-lug days might be back again.  07/30/22: Research states the risk for dysphagia increases due to radiation and/or chemotherapy treatment due to a variety of factors, so SLP educated the pt about the possibility of reduced/limited ability for PO intake during rad tx. SLP also educated pt regarding possible changes to swallowing musculature after rad tx, and why adherence to  dysphagia HEP provided today and PO consumption was necessary to inhibit muscle fibrosis following rad tx and to mitigate muscle disuse atrophy. SLP informed pt why this would be detrimental to their swallowing status and to their pulmonary health. Pt demonstrated understanding of these things to SLP. SLP encouraged pt to safely eat and drink as deep into their radiation/chemotherapy as possible to provide the best possible long-term swallowing outcome for pt.    SLP then developed an individualized HEP for pt involving oral and pharyngeal and vocal  strengthening and ROM and pt was instructed how to perform these exercises, including SLP demonstration. After SLP demonstration, pt return demonstrated each exercise. SLP ensured pt performance was correct prior to educating pt on next exercise. Pt required rare min-mod cues faded to modified independent to perform HEP. Pt was instructed to complete this program 6-7 days/week, at least 2 times a day until 6 months after his last day of rad tx, and then x2 a week after that, indefinitely.   PATIENT EDUCATION: Education details: late effects head/neck radiation on swallow function, HEP procedure, and modification to HEP when difficulty experienced with swallowing during and after radiation course Person educated: Patient Education method: Explanation, Demonstration, Verbal cues, and Handouts Education comprehension: verbalized understanding, returned demonstration, verbal cues required, and needs further education   ASSESSMENT:  CLINICAL IMPRESSION: Patient is a 88 y.o. male who was seen today for treatment of swallowing as he has completed radiation/chemoradiation therapy. Aphonia exhibited today. Today pt ate items from [place] [place], and drank thin liquids. No overt s/sx oral or pharyngeal difficulty noted with these items however pt was following precautions from MBS. At this time pt swallowing is deemed WNL/WFL with these POs, given that he follows  precautions from MBS as listed on that exam. There are no overt s/s aspiration PNA observed by SLP nor any reported by pt at this time. Data indicate that pt's swallow ability will likely decrease over the course of radiation therapy and could very well decline over time following the conclusion of that therapy due to muscle disuse atrophy and/or muscle fibrosis. Pt will cont to need to be seen by SLP in order to assess safety of PO intake, assess the need for recommending any objective swallow assessment, and ensuring pt is correctly completing the individualized HEP.  OBJECTIVE IMPAIRMENTS include voice disorder and dysphagia. These impairments are limiting patient from effectively communicating at home and in community and safety when swallowing. Factors affecting potential to achieve goals and functional outcome are  none . Patient will benefit from skilled SLP services to address above impairments and improve overall function.  REHAB POTENTIAL: Good   GOALS: Goals reviewed with patient? No  SHORT TERM GOALS: Target:  3 sessions    (4th therapy session)  pt will complete HEP with rare min A  Baseline: Goal status: Met   pt will tell SLP why pt is completing HEP with  modified independence Baseline:  Goal status: Met  pt will describe 3 overt s/s aspiration PNA with modified independence Baseline:  Goal status: Ongoing  Pt will tell SLP how a food journal could hasten return to a more normalized diet Baseline:  Goal status: Deferred - pt "eating what I've eaten for years"  LONG TERM GOALS: Target:  6 sessions   (7th session)  pt will complete HEP with modified independence over two visits Baseline:  Goal status: Ongoing   2.  pt will describe how to modify HEP over time, and the timeline associated with reduction in HEP frequency with modified independence over two sessions Baseline:  Goal status: Ongoing  PLAN: SLP FREQUENCY:  once approx every four weeks  SLP DURATION: 6  months  PLANNED INTERVENTIONS: Aspiration precaution training, Pharyngeal strengthening exercises, Diet toleration management , Environmental controls, Trials of upgraded texture/liquids, Cueing hierachy, SLP instruction and feedback, Compensatory strategies, and Patient/family education    [facility], [place] 09/04/2022, 5:06 PM

## 2022-09-04 NOTE — Progress Notes (Signed)
Darren Edwards is here to be seen for follow up for Squamous cell carcinoma of the glottis.   Vocal cord cancer [facility]) Staging form: Larynx - Glottis, AJCC 8th Edition - Clinical stage from 07/05/2022: Stage II (cT2, cN0, cM0) - Signed by [name], MD on 07/05/2022 He completed treatment on 08-23-22.   Pain issues, if any: throat irritation with excessive whispering Using a feeding tube?: no Weight changes, if any: gaining weight Swallowing issues, if any: no swallowing issues Smoking or chewing tobacco? no Using fluoride trays daily? Crest mouthwash Last ENT visit was on: Sept 6, 2023, right before treatment Other notable issues, if any: when can he resume vitamin c and calcium  Wt Readings from Last 3 Encounters:  09/13/22 130 lb (59 kg)  07/30/22 129 lb (58.5 kg)  07/05/22 126 lb 12.8 oz (57.5 kg)   Vitals:   09/13/22 1354  BP: (!) 168/66  Pulse: 72  Resp: 20  Temp: 97.9 F (36.6 C)  SpO2: 100%

## 2022-09-12 ENCOUNTER — Encounter: Payer: Self-pay | Admitting: Physical Therapy

## 2022-09-12 ENCOUNTER — Ambulatory Visit: Payer: Medicare Other

## 2022-09-12 ENCOUNTER — Ambulatory Visit: Payer: Medicare Other | Admitting: Physical Therapy

## 2022-09-12 DIAGNOSIS — C320 Malignant neoplasm of glottis: Secondary | ICD-10-CM | POA: Diagnosis not present

## 2022-09-12 DIAGNOSIS — R293 Abnormal posture: Secondary | ICD-10-CM | POA: Diagnosis not present

## 2022-09-12 DIAGNOSIS — R1310 Dysphagia, unspecified: Secondary | ICD-10-CM | POA: Diagnosis not present

## 2022-09-12 DIAGNOSIS — R490 Dysphonia: Secondary | ICD-10-CM | POA: Diagnosis not present

## 2022-09-12 NOTE — Therapy (Signed)
OUTPATIENT PHYSICAL THERAPY HEAD AND NECK POST RADIATION FOLLOW UP   Patient Name: Darren Edwards MRN: [id number] DOB:[birth year], 88 y.o., male Today's Date: 09/12/2022  END OF SESSION:  PT End of Session - 09/12/22 1407     Visit Number 2    Number of Visits 2    Date for PT Re-Evaluation 09/24/22    PT Start Time 1324    PT Stop Time 1430    PT Time Calculation (min) 26 min    Activity Tolerance Patient tolerated treatment well    Behavior During Therapy WFL for tasks assessed/performed             Past Medical History:  Diagnosis Date   Arthritis    Knee   Cancer ([place])    HX SKIN CANCERS / HX PROSTATE CANCER   Family history of cancer of male genital organ    Family history of genetic mutation for hereditary nonpolyposis colorectal cancer (HNPCC)    Family history of lung cancer    Family history of pancreatic cancer    Family history of prostate cancer    Family history of stomach cancer    GERD (gastroesophageal reflux disease)    GERD (gastroesophageal reflux disease) 10/16/2018   Hypercholesteremia    Squamous cell skin cancer 10/16/2018   Vocal cord cancer ([place]) 10/16/2018   Superficially invasive squamous cell    Past Surgical History:  Procedure Laterality Date   COLONOSCOPY  2011   w/Dr. Deatra Ina   MICROLARYNGOSCOPY W/VOCAL CORD INJECTION Right 07/19/2016   Procedure: MICROLARYNGOSCOPY WITH EXCISION OF RIGHT  VOCAL CORD POLYP;  Surgeon: [name], MD;  Location: [facility] OR;  Service: ENT;  Laterality: Right;   MICROLARYNGOSCOPY WITH CO2 LASER AND EXCISION OF VOCAL CORD LESION Right 06/26/2022   Procedure: MICROLARYNGOSCOPY WITH VOCAL CORD [place] OF RIGHT CORD NODULE;  Surgeon: [name], MD;  Location: [place];  Service: ENT;  Laterality: Right;   PARTIAL KNEE ARTHROPLASTY Left 02/12/2016   Procedure: UNICOMPARTMENTAL LEFT KNEE MEDIALLY;  Surgeon: Paralee Cancel, MD;  Location: [facility];  Service: Orthopedics;  Laterality: Left;   PROSTATECTOMY      SKIN CANCER EXCISION     TONSILLECTOMY     Patient Active Problem List   Diagnosis Date Noted   Actinic keratosis 04/16/2022   Encounter for fitting and adjustment of hearing aid 04/16/2022   Personal history of malignant melanoma of skin 04/16/2022   HLD (hyperlipidemia) 04/16/2022   Ear fullness, right 02/05/2022   Muscle strain of right gluteal region 05/12/2020   Genetic testing 07/28/2019   Family history of genetic mutation for hereditary nonpolyposis colorectal cancer (HNPCC)    Family history of lung cancer    Family history of cancer of male genital organ    Family history of stomach cancer    Family history of pancreatic cancer    Family history of prostate cancer    Dizziness 01/25/2019   Vocal cord cancer ([place]) 10/16/2018   Squamous cell skin cancer 10/16/2018   GERD (gastroesophageal reflux disease) 10/16/2018   Chronic hoarseness 10/16/2018   Hyperglycemia 10/16/2018   S/P left UKR 02/12/2016   Status post unilateral knee replacement 02/12/2016   External hemorrhoid, bleeding 08/11/2014   Trochanteric bursitis or tendonitis 09/28/2013   Vocal cord nodule 09/01/2013   Chondromalacia patellae 05/31/2013   Greater trochanteric bursitis of left hip 12/23/2012   Degenerative tear of medial meniscus 07/01/2012   Prepatellar bursitis 04/01/2012   Left knee pain 01/23/2012   HOARSENESS  06/20/2010   RECTAL BLEEDING 02/05/2010   Allergic rhinitis 10/04/2009   Melanoma of skin ([place]) 12/30/2007   ADENOCARCINOMA, PROSTATE 12/30/2007   CHRONIC GRANULOMATOUS DISEASE 12/30/2007   Essential hypertension 12/30/2007   ASTHMATIC BRONCHITIS, ACUTE 12/30/2007   DIVERTICULOSIS OF COLON 12/30/2007   RENAL CYST 12/30/2007   DEGENERATIVE [place] DISEASE 12/30/2007   HYPERCHOLESTEROLEMIA 12/22/2007    PCP: [name], MD   REFERRING PROVIDER: [name], MD   REFERRING DIAG: C32.0 (ICD-10-CM) - Vocal cord cancer [facility])   THERAPY DIAG:  Abnormal posture  Malignant  neoplasm of vocal cord [facility])  Rationale for Evaluation and Treatment: Rehabilitation  ONSET DATE: 06/05/22  SUBJECTIVE:                                                                                                                                                                                           SUBJECTIVE STATEMENT: I feel great. Pt continued to do regular work regime the entire time. Pt runs, walks, lifts weights and rides stationary bike.   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 Since that time he has been followed by Dr. Wilburn Cornelia every 6 months. 06/05/2022 He followed up with Dr. Wilburn Cornelia and reported several weeks of worsening hoarseness and mild vocal discomfort. Laryngoscopy performed during this visit revealed a nodular raised area in the anterior aspect of the right vocal cord. 06/26/22 he underwent vocal cord biopsies with Dr. Wilburn Cornelia. He was found to have 2 mm broad-based papillomatous mass involving the anterior one third of the right true vocal cord and extending onto the superior surface. Pathology from the procedure revealed invasive well-differentiated squamous cell carcinoma.  07/04/22 CT neck revealed no abnormal soft tissue mass or enhancement along the right true vocal fold to suggest residual disease s/p excision. CT also showed no evidence of pathologic lymphadenopathy in the neck. He will receive 20 fractions of radiation to his Larynx.  He starts on 07/29/22 and will complete on 08/23/22.    PATIENT GOALS:  Reassess how my recovery is going related to neck ROM, cervical pain, fatigue, and swelling.  PAIN:  Are you having pain? No  PRECAUTIONS: Recent radiation, Head and neck lymphedema risk, Other: partial knee replacement on L    OBJECTIVE:   POSTURE:  Forward head and rounded  shoulders posture  30 SEC SIT TO STAND: 16 reps in 30 sec without use of UEs which is  Excellentfor patient's age. 16 reps without UEs at eval.  SHOULDER AROM:   [facility]  CERVICAL AROM:     Percent limited 09/12/22  Flexion WFL WFL  Extension 25% limited WFL  Right lateral flexion [facility] WFL  Left lateral flexion [facility] WFL  Right rotation [facility] WFL  Left rotation WFL WFL                          (Blank rows=not tested)  LYMPHEDEMA ASSESSMENT:    Circumference in cm  4 cm superior to sternal notch around neck 35  6 cm superior to sternal notch around neck 36  8 cm superior to sternal notch around neck 37  R lateral nostril from base of nose to medial tragus   L lateral nostril from base of nose to medial tragus   R corner of mouth to where ear lobe meets face   L corner of mouth to where ear lobe meets face         (Blank rows=not tested)  CURRENT/PAST TREATMENTS:  Surgery type/date: 06/26/22 - vocal cord biopsies  Chemotherapy: did not require  Radiation:completed  OTHER SYMPTOMS: Pain No Fibrosis No Pitting edema No Infections No Decreased scar mobility No  PATIENT EDUCATION:  Education details: lymphedema and what to look for, posture Person educated: Patient Education method: Explanation and Handouts Education comprehension: verbalized understanding  HOME EXERCISE PROGRAM: Reviewed previously given post op HEP.   ASSESSMENT:  CLINICAL IMPRESSION: Pt returns to PT after undergoing radiation for treatment of vocal cord cancer. He was extremely active prior to diagnosis and was able to maintain that throughout radiation. His neck ROM is still WFL. He did his neck stretches multiple times daily. He does not have any need for skilled PT services at this time. Educated pt and family if he notices any edema to let his doctor know so he can be referred back to PT.   Pt will benefit from skilled therapeutic intervention to improve on the following deficits: decreased  knowledge of condition  PT treatment/interventions: ADL/Self care home management,    GOALS Name Target Date  Goal status  1 Pt will demonstrate a return to baseline cervical ROM measurements and not demonstrate any signs or symptoms of lymphedema. Baseline: 09/12/22  MET  _0 PLAN:  PT FREQUENCY/DURATION: d/c this visit  PLAN FOR NEXT SESSION: d/c this visit      Manus Gunning, PT 09/12/2022, 2:39 PM  PHYSICAL THERAPY DISCHARGE SUMMARY  Visits from Start of Care: 2  Current functional level related to goals / functional outcomes: All goals met   Remaining deficits: None   Education / Equipment: HEP   Patient agrees to discharge. Patient goals were met. Patient is being discharged due to meeting the stated rehab goals.  Allyson Sabal Al[place], [place] 09/12/22 2:39 PM

## 2022-09-13 ENCOUNTER — Encounter: Payer: Self-pay | Admitting: Radiation Oncology

## 2022-09-13 ENCOUNTER — Ambulatory Visit
Admission: RE | Admit: 2022-09-13 | Discharge: 2022-09-13 | Disposition: A | Discharge status: Home / Self Care | Payer: Medicare Other | Source: Ambulatory Visit | Attending: Radiation Oncology | Admitting: Radiation Oncology

## 2022-09-13 VITALS — BP 168/66 | HR 72 | Temp 97.9°F | Resp 20 | Ht 69.0 in | Wt 130.0 lb

## 2022-09-13 DIAGNOSIS — C320 Malignant neoplasm of glottis: Secondary | ICD-10-CM | POA: Diagnosis not present

## 2022-09-13 DIAGNOSIS — Z7982 Long term (current) use of aspirin: Secondary | ICD-10-CM | POA: Insufficient documentation

## 2022-09-13 DIAGNOSIS — R490 Dysphonia: Secondary | ICD-10-CM | POA: Insufficient documentation

## 2022-09-13 DIAGNOSIS — Z923 Personal history of irradiation: Secondary | ICD-10-CM | POA: Insufficient documentation

## 2022-09-13 NOTE — Progress Notes (Signed)
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  [name] is here to be seen for follow up for Squamous cell carcinoma of the glottis.   Vocal cord cancer [facility]) Staging form: Larynx - Glottis, AJCC 8th Edition - Clinical stage from 07/05/2022: Stage II (cT2, cN0, cM0) - Signed by [name], MD on 07/05/2022 He completed treatment on 08-23-22.   Pain issues, if any: throat irritation with excessive whispering Using a feeding tube?: no Weight changes, if any: gaining weight Swallowing issues, if any: no swallowing issues Smoking or chewing tobacco? no Using fluoride trays daily? Crest mouthwash Last ENT visit was on: Sept 6, 2023, right before treatment Other notable issues, if any: when can he resume vitamin c and calcium  Wt Readings from Last 3 Encounters:  09/13/22 130 lb (59 kg)  07/30/22 129 lb (58.5 kg)  07/05/22 126 lb 12.8 oz (57.5 kg)   Vitals:   09/13/22 1354  BP: (!) 168/66  Pulse: 72  Resp: 20  Temp: 97.9 F (36.6 C)  SpO2: 100%                       ALLERGIES:  is allergic to clindamycin and penicillins.  Meds: Current Outpatient Medications  Medication Sig Dispense Refill   aspirin 81 MG chewable tablet Chew 81 mg by mouth daily.     cholecalciferol (VITAMIN D) 1000 units tablet Take 1,000 Units by mouth daily.     Coenzyme Q10 (CO Q 10) 100 MG CAPS Take 100 mg by  mouth daily.  30 capsule 0   Multiple Vitamins-Minerals (ONE-A-DAY MENS 50+ ADVANTAGE) TABS Take 1 tablet by mouth daily after breakfast.     simvastatin (ZOCOR) 20 MG tablet Take 1 tablet (20 mg total) by mouth at bedtime. 90 tablet 3   Zinc 30 MG TABS Take 30 mg by mouth daily.     Ascorbic Acid (VITAMIN C) 1000 MG tablet Take 1,000 mg by mouth daily. (Patient not taking: Reported on 09/13/2022)     lidocaine (XYLOCAINE) 2 % solution Patient: Mix 1part 2% viscous lidocaine, 1part H20. Swallow 65m of diluted mixture, 365m before meals and at bedtime, up to QID (Patient not taking: Reported on 09/13/2022) 200 mL 3   No current facility-administered medications for this encounter.    Physical Findings: The patient is in no acute distress. Patient is alert and oriented. Wt Readings from Last 3 Encounters:  09/13/22 130 lb (59 kg)  07/30/22 129 lb (58.5 kg)  07/05/22 126 lb 12.8 oz (57.5 kg)    height is '5\' 9"'$  (1.753 m) and weight is 130 lb (59 kg). His temperature is 97.9 F (36.6 C). His blood pressure is 168/66 (abnormal) and his pulse is 72. His respiration is 20 and oxygen saturation is 100%. .  General: Alert  and oriented, in no acute distress HEENT: Head is normocephalic. Extraocular movements are intact. Oropharynx is notable for no lesions in the mouth or upper throat Neck: Neck is notable for erythema of skin in treatment fields but the skin is intact and healing nicely. Skin: Skin in treatment fields shows satisfactory healing as above MSK: Well-nourished, well-appearing, ambulatory Psychiatric: Judgment and insight are intact. Affect is appropriate.   Lab Findings: Lab Results  Component Value Date   WBC 5.9 06/26/2022   HGB 14.3 06/26/2022   HCT 43.5 06/26/2022   MCV 98.6 06/26/2022   PLT 207 06/26/2022    Lab Results  Component Value Date   TSH 2.53 04/16/2022    Radiographic Findings: DG SWALLOW FUNC OP MEDICARE SPEECH PATH  Result Date: 08/30/2022 CLINICAL  DATA:  88 year old with dysphagia after completing radiation treatment for laryngeal carcinoma. EXAM: MODIFIED BARIUM SWALLOW TECHNIQUE: Different consistencies of barium were administered orally to the patient by the Speech Pathologist. Imaging of the pharynx was performed in the lateral projection. [name] PA-C was present in the fluoroscopy room during this study, which was supervised and interpreted by [name], MD. FLUOROSCOPY: Radiation Exposure Index (as provided by the fluoroscopic device): 25.4 mGy Kerma COMPARISON:  None Available. FINDINGS: Aspiration: Seen with consecutive swallows of thin liquids as well as with attempts to swallow a barium tablet when additional thin liquids were administered. Other: Prominence of the cricopharyngeus muscle, with a 13 mm barium tablet lodging at this location and not passing despite multiple swallowing attempts. IMPRESSION: Aspiration of thin liquids as above. Please refer to the Speech Pathologists report for complete details and recommendations. Electronically Signed   By: [name] M.D.   On: 08/30/2022 13:54    Impression/Plan:    1) Head and Neck Cancer Status: He is healing from radiation therapy and is still very hoarse.  I let the patient and his family know that it is typical for hoarseness to linger for a few months after treatment.  Recommend resting his voice as able.  He would like to try some aloe juice which is fine and may soothe his pharynx and esophagus but will not help his larynx.  Fortunately he denies significant pain.  It is fine for him to resume his normal vitamins and antioxidant supplements.  We will refer him to back to otolaryngology for a checkup in early January and I will see him back in early April (patient was seen earlier by Dr. Wilburn Cornelia who has retired so we will refer him to another provider)  2) Nutritional Status: Doing well, gaining weight PEG tube: None  3)  Swallowing: Continue following with  speech-language pathology.  He can also ask [facility] about speech therapy as indicated, given his hoarseness  4)  Skin: healing well, use vitamin E twice a day for 2 more months  5) Thyroid function: Check annually here or with PCP Lab Results  Component Value Date   TSH 2.53 04/16/2022     It was wonderful to see the patient and his family today.  They know to call if they have any issues before his next follow-up with me.  On date of service, in total, I spent 30 minutes on this encounter. Patient was seen in person. _____________________________________   [name], MD

## 2022-09-16 NOTE — Progress Notes (Signed)
Oncology Nurse Navigator Documentation   At Dr. Pearlie Oyster request I have scheduled Mr. Magwood with Dr. Constance Holster at [facility] on 11/18/22 at 1:20 pm. I have notified his wife Leda Gauze of the appointment and she is agreeable to the date and time. She knows to call me if she has any questions or concerns.   Harlow Asa RN, BSN, OCN Head & Neck Oncology Nurse [place] at [facility] Phone # [phone]  Fax # [phone]

## 2022-09-17 NOTE — Progress Notes (Signed)
                                                                                                                                                             Patient Name: Darren Edwards MRN: [id number] DOB: 1929-07-07 Referring Physician: Wilburn Cornelia DAVID (Profile Not Attached) Date of Service: 08/23/2022 [facility], [place]                                                        End Of Treatment Note  Diagnoses: C32.0-Malignant neoplasm of glottis  Cancer Staging:  Cancer Staging  Vocal cord cancer [facility]) Staging form: Larynx - Glottis, AJCC 8th Edition - Clinical stage from 07/05/2022: Stage II (cT2, cN0, cM0) - Signed by [name], MD on 07/05/2022  Intent: Curative  Radiation Treatment Dates: 07/29/2022 through 08/23/2022 Site Technique Total Dose (Gy) Dose per Fx (Gy) Completed Fx Beam Energies  Larynx: HN_Larynx 3D 54/54 2.7 20/20 6X   Narrative: The patient tolerated radiation therapy relatively well.   Plan: The patient will follow-up with radiation oncology in 3 wks.  -----------------------------------  [name], MD

## 2022-10-01 ENCOUNTER — Encounter: Payer: Self-pay | Admitting: Physical Therapy

## 2022-10-02 ENCOUNTER — Ambulatory Visit: Payer: Medicare Other | Attending: Radiation Oncology

## 2022-10-02 DIAGNOSIS — R1310 Dysphagia, unspecified: Secondary | ICD-10-CM | POA: Diagnosis not present

## 2022-10-02 NOTE — Patient Instructions (Signed)
   Signs of Aspiration Pneumonia   Chest pain/tightness Fever (can be low grade) Cough  With foul-smelling phlegm (sputum) With sputum containing pus or blood With greenish sputum Fatigue  Shortness of breath  Wheezing   **IF YOU HAVE THESE SIGNS, CONTACT YOUR DOCTOR OR GO TO THE EMERGENCY DEPARTMENT OR URGENT CARE AS SOON AS POSSIBLE**     

## 2022-10-02 NOTE — Therapy (Signed)
OUTPATIENT SPEECH LANGUAGE PATHOLOGY TREATMENT   Patient Name: Darren Edwards MRN: [id number] DOB:1929/02/25, 88 y.o., male Today's Date: 10/02/2022  PCP: Cathlean Cower, MD REFERRING PROVIDER: [name], MD   End of Session - 10/02/22 1547     Visit Number 3    Number of Visits 7    Date for SLP Re-Evaluation 10/28/22    SLP Start Time 1533    SLP Stop Time  4098    SLP Time Calculation (min) 30 min    Activity Tolerance Patient tolerated treatment well               Past Medical History:  Diagnosis Date   Arthritis    Knee   Cancer ([place])    HX SKIN CANCERS / HX PROSTATE CANCER   Family history of cancer of male genital organ    Family history of genetic mutation for hereditary nonpolyposis colorectal cancer (HNPCC)    Family history of lung cancer    Family history of pancreatic cancer    Family history of prostate cancer    Family history of stomach cancer    GERD (gastroesophageal reflux disease)    GERD (gastroesophageal reflux disease) 10/16/2018   Hypercholesteremia    Squamous cell skin cancer 10/16/2018   Vocal cord cancer ([place]) 10/16/2018   Superficially invasive squamous cell    Past Surgical History:  Procedure Laterality Date   COLONOSCOPY  2011   w/Dr. Deatra Ina   MICROLARYNGOSCOPY W/VOCAL CORD INJECTION Right 07/19/2016   Procedure: MICROLARYNGOSCOPY WITH EXCISION OF RIGHT  VOCAL CORD POLYP;  Surgeon: [name], MD;  Location[facility];  Service: ENT;  Laterality: Right;   MICROLARYNGOSCOPY WITH CO2 LASER AND EXCISION OF VOCAL CORD LESION Right 06/26/2022   Procedure: MICROLARYNGOSCOPY WITH VOCAL CORD [facility] OF RIGHT CORD NODULE;  Surgeon: [name], MD;  Location: [place];  Service: ENT;  Laterality: Right;   PARTIAL KNEE ARTHROPLASTY Left 02/12/2016   Procedure: UNICOMPARTMENTAL LEFT KNEE MEDIALLY;  Surgeon: Paralee Cancel, MD;  Location: [facility] ORS;  Service: Orthopedics;  Laterality: Left;   PROSTATECTOMY     SKIN CANCER EXCISION      TONSILLECTOMY     Patient Active Problem List   Diagnosis Date Noted   Actinic keratosis 04/16/2022   Encounter for fitting and adjustment of hearing aid 04/16/2022   Personal history of malignant melanoma of skin 04/16/2022   HLD (hyperlipidemia) 04/16/2022   Ear fullness, right 02/05/2022   Muscle strain of right gluteal region 05/12/2020   Genetic testing 07/28/2019   Family history of genetic mutation for hereditary nonpolyposis colorectal cancer (HNPCC)    Family history of lung cancer    Family history of cancer of male genital organ    Family history of stomach cancer    Family history of pancreatic cancer    Family history of prostate cancer    Dizziness 01/25/2019   Vocal cord cancer ([place]) 10/16/2018   Squamous cell skin cancer 10/16/2018   GERD (gastroesophageal reflux disease) 10/16/2018   Chronic hoarseness 10/16/2018   Hyperglycemia 10/16/2018   S/P left UKR 02/12/2016   Status post unilateral knee replacement 02/12/2016   External hemorrhoid, bleeding 08/11/2014   Trochanteric bursitis or tendonitis 09/28/2013   Vocal cord nodule 09/01/2013   Chondromalacia patellae 05/31/2013   Greater trochanteric bursitis of left hip 12/23/2012   Degenerative tear of medial meniscus 07/01/2012   Prepatellar bursitis 04/01/2012   Left knee pain 01/23/2012   HOARSENESS 06/20/2010   RECTAL BLEEDING 02/05/2010  Allergic rhinitis 10/04/2009   Melanoma of skin ([place]) 12/30/2007   ADENOCARCINOMA, PROSTATE 12/30/2007   CHRONIC GRANULOMATOUS DISEASE 12/30/2007   Essential hypertension 12/30/2007   ASTHMATIC BRONCHITIS, ACUTE 12/30/2007   DIVERTICULOSIS OF COLON 12/30/2007   RENAL CYST 12/30/2007   DEGENERATIVE DISC DISEASE 12/30/2007   HYPERCHOLESTEROLEMIA 12/22/2007    ONSET DATE: August 2023   REFERRING DIAG: Vocal cord cancer  THERAPY DIAG:  Dysphagia, unspecified type  Rationale for Evaluation and Treatment Rehabilitation  SUBJECTIVE:   SUBJECTIVE STATEMENT: Pt  has mod hoarse voice. Is not eating dense foods, and having more liquid with most foods. Pt accompanied by:  two daughters  PERTINENT HISTORY:  Vocal cord cancer, stage II (T2, N0, M0). 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 Since that time he has been followed by Dr. Wilburn Cornelia every 6 months. 06/05/2022 He followed up with Dr. Wilburn Cornelia and reported several weeks of worsening hoarseness and mild vocal discomfort. Laryngoscopy performed during this visit revealed a nodular raised area in the anterior aspect of the right vocal cord. 06/26/22 he underwent vocal cord biopsies with Dr. Wilburn Cornelia. He was found to have 2 mm broad-based papillomatous mass involving the anterior one third of the right true vocal cord and extending onto the superior surface. Pathology from the procedure revealed invasive well-differentiated squamous cell carcinoma. 07/04/22 CT neck revealed no abnormal soft tissue mass or enhancement along the right true vocal fold to suggest residual disease s/p excision. CT also showed no evidence of pathologic lymphadenopathy in the neck. He will receive 20 fractions of radiation to his Larynx.  He starts on 07/29/22 and will complete on 08/23/22.  PAIN:  Are you having pain? No  FALLS: Has patient fallen in last 6 months?  No  LIVING ENVIRONMENT: Lives with: lives with their spouse Lives in: House/apartment  PLOF:  Level of assistance: Independent with ADLs Employment: Retired   PATIENT GOALS Maintain WNL swallow  OBJECTIVE:   DIAGNOSTIC FINDINGS:  February 11 2019- IMPRESSION: 1. No acute intracranial abnormality. 2. 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 3. Minimal chronic small vessel ischemic disease in the cerebral white matter. 4. Mild-to-moderate cerebral atrophy.   COGNITION: Overall cognitive status: Within functional limits for tasks assessed    LANGUAGE: Receptive and Expressive language appeared WNL.  ORAL MOTOR EXAMINATION Overall status: WFL  MOTOR SPEECH: Overall motor speech: Appears intact Level of impairment: Word Respiration: thoracic breathing and clavicular breathing Phonation: breathy and hoarse Resonance: WFL Articulation: Appears intact Intelligibility: Intelligible Motor planning: Appears intact   CLINICAL SWALLOW ASSESSMENT:   Current diet: Dysphagia 3 (mechanical soft), thin liquids, and current diet modifications: pt does not eat "dense" foods as often as he used to Objective swallow impairments: needs liquid wash at times for pharyngeal clearance Objective recommended compensations: use water/liquid PRN to moisten bolus to enable swallowing Dentition: adequate natural dentition Patient directly observed with POs: Yes: dysphagia 3 (soft), dysphagia 1 (puree), and thin liquids  Feeding: able to feed self Liquids provided by: straw Oral phase signs and symptoms:  nothing noted Pharyngeal phase signs and symptoms: multiple swallows and complaints of residue   TODAY'S TREATMENT:  10/02/22: Pt reports his swallowing is improved. Peanut butter sandwich, applesauce, lentil soup, chicken casserole, green beans, cold cereal with fruit. No coughing reported by pt nor Id'd by wife with POs. Today pt ate cereal bar and drank water without any overt s/sx of oral  or pharyngeal deficits. He used water for HEP (independent with this) and had no overt s/sx oral or pharyngeal deficits. Pt also told SLP when he can reduce frequency of HEP. SLP provided pt with overt s/sx aspiration PNA and he told SLP 3 of these with modified indpendence. SLP and pt agreed that due to WNL HEP and no overt s/sx dysphagia that he could be seen again in 8 weeks. SLP also mentioned some ways to encourage vocal health/hygiene.   09/04/22: Pt arrived today in good spirits appreciative of MBS last week. He has continued performing the HEP throughout  his rad tx and afterwards. Franchot Mimes told SLP the reason for his HEP, and completed the HEP with rare min A (hold point for [facility]). SLP reviewed his MBS with him and wife. Pt followed precautions from [place] with independence. "I told her I think my chug-a-lug days are over, Glendell Docker," pt stated. SLP told pt if he cont with HEP his chug-a-lug days might be back again.  07/30/22: Research states the risk for dysphagia increases due to radiation and/or chemotherapy treatment due to a variety of factors, so SLP educated the pt about the possibility of reduced/limited ability for PO intake during rad tx. SLP also educated pt regarding possible changes to swallowing musculature after rad tx, and why adherence to dysphagia HEP provided today and PO consumption was necessary to inhibit muscle fibrosis following rad tx and to mitigate muscle disuse atrophy. SLP informed pt why this would be detrimental to their swallowing status and to their pulmonary health. Pt demonstrated understanding of these things to SLP. SLP encouraged pt to safely eat and drink as deep into their radiation/chemotherapy as possible to provide the best possible long-term swallowing outcome for pt.    SLP then developed an individualized HEP for pt involving oral and pharyngeal and vocal  strengthening and ROM and pt was instructed how to perform these exercises, including SLP demonstration. After SLP demonstration, pt return demonstrated each exercise. SLP ensured pt performance was correct prior to educating pt on next exercise. Pt required rare min-mod cues faded to modified independent to perform HEP. Pt was instructed to complete this program 6-7 days/week, at least 2 times a day until 6 months after his last day of rad tx, and then x2 a week after that, indefinitely.   PATIENT EDUCATION: Education details:  overt s/sx aspiration PNA , vocal health/hygiene Person educated: Patient and Spouse Education method: Explanation and Handouts Education  comprehension: verbalized understanding and returned demonstration   ASSESSMENT:  CLINICAL IMPRESSION: Patient is a 88 y.o. male who was seen today for treatment of swallowing as he has completed radiation/chemoradiation therapy. Min-mod hoarseness exhibited today. Today pt ate items from [facility], and drank thin liquids. No overt s/sx oral or pharyngeal difficulty noted with these items. At this time pt swallowing is deemed WNL/WFL with these POs. There are no overt s/s aspiration PNA observed by SLP nor any reported by pt at this time. Data indicate that pt's swallow ability could very well decline over time following the conclusion of that therapy due to muscle disuse atrophy and/or muscle fibrosis. Pt will cont to need to be seen by SLP in order to assess safety of PO intake, assess the need for recommending any objective swallow assessment, and ensuring pt is correctly completing the individualized HEP.  OBJECTIVE IMPAIRMENTS include voice disorder and dysphagia. These impairments are limiting patient from effectively communicating at home and in community and safety when swallowing. Factors affecting potential  to achieve goals and functional outcome are  none . Patient will benefit from skilled SLP services to address above impairments and improve overall function.  REHAB POTENTIAL: Good   GOALS: Goals reviewed with patient? No  SHORT TERM GOALS: Target:  3 sessions    (4th therapy session)  pt will complete HEP with rare min A  Baseline: Goal status: Met   pt will tell SLP why pt is completing HEP with modified independence Baseline:  Goal status: Met  pt will describe 3 overt s/s aspiration PNA with modified independence Baseline:  Goal status: Met  Pt will tell SLP how a food journal could hasten return to a more normalized diet Baseline:  Goal status: Deferred - pt "eating what I've eaten for years"  LONG TERM GOALS: Target:  6 sessions   (7th session)  pt will complete  HEP with modified independence over two visits Baseline: 10-02-22 Goal status: Ongoing   2.  pt will describe how to modify HEP over time, and the timeline associated with reduction in HEP frequency with modified independence over two sessions Baseline: 10-02-22 Goal status: Ongoing  PLAN: SLP FREQUENCY:  once approx every eight weeks  SLP DURATION: 6 months  PLANNED INTERVENTIONS: Aspiration precaution training, Pharyngeal strengthening exercises, Diet toleration management , Environmental controls, Trials of upgraded texture/liquids, Cueing hierachy, SLP instruction and feedback, Compensatory strategies, and Patient/family education    [facility], [place] 10/02/2022, 4:24 PM

## 2022-10-25 DIAGNOSIS — Z85828 Personal history of other malignant neoplasm of skin: Secondary | ICD-10-CM | POA: Diagnosis not present

## 2022-10-25 DIAGNOSIS — L820 Inflamed seborrheic keratosis: Secondary | ICD-10-CM | POA: Diagnosis not present

## 2022-10-25 DIAGNOSIS — L570 Actinic keratosis: Secondary | ICD-10-CM | POA: Diagnosis not present

## 2022-10-25 DIAGNOSIS — D485 Neoplasm of uncertain behavior of skin: Secondary | ICD-10-CM | POA: Diagnosis not present

## 2022-11-14 ENCOUNTER — Encounter: Payer: Self-pay | Admitting: Physical Therapy

## 2022-11-19 DIAGNOSIS — C320 Malignant neoplasm of glottis: Secondary | ICD-10-CM | POA: Diagnosis not present

## 2022-11-19 DIAGNOSIS — Z923 Personal history of irradiation: Secondary | ICD-10-CM | POA: Diagnosis not present

## 2022-12-03 ENCOUNTER — Ambulatory Visit: Payer: Medicare Other

## 2022-12-03 ENCOUNTER — Ambulatory Visit: Payer: Medicare Other | Attending: Radiation Oncology

## 2022-12-03 DIAGNOSIS — R1310 Dysphagia, unspecified: Secondary | ICD-10-CM | POA: Diagnosis not present

## 2022-12-03 NOTE — Addendum Note (Signed)
Addended by: Garald Balding B on: 12/03/2022 04:14 PM   Modules accepted: Orders

## 2022-12-03 NOTE — Therapy (Signed)
OUTPATIENT SPEECH LANGUAGE PATHOLOGY TREATMENT/renewal-discharge summary   Patient Name: Darren Edwards MRN: [id number] DOB:Sep 23, 1929, 89 y.o., male Today's Date: 12/03/2022  PCP: Cathlean Cower, MD REFERRING PROVIDER: [name], MD   End of Session - 12/03/22 1538     Visit Number 4    Number of Visits 7    Date for SLP Re-Evaluation 12/03/22    SLP Start Time 57    SLP Stop Time  45    SLP Time Calculation (min) 35 min    Activity Tolerance Patient tolerated treatment well               Past Medical History:  Diagnosis Date   Arthritis    Knee   Cancer ([place])    HX SKIN CANCERS / HX PROSTATE CANCER   Family history of cancer of male genital organ    Family history of genetic mutation for hereditary nonpolyposis colorectal cancer (HNPCC)    Family history of lung cancer    Family history of pancreatic cancer    Family history of prostate cancer    Family history of stomach cancer    GERD (gastroesophageal reflux disease)    GERD (gastroesophageal reflux disease) 10/16/2018   Hypercholesteremia    Squamous cell skin cancer 10/16/2018   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 Deatra Ina   MICROLARYNGOSCOPY W/VOCAL CORD INJECTION Right 07/19/2016   Procedure: MICROLARYNGOSCOPY WITH EXCISION OF RIGHT  VOCAL CORD POLYP;  Surgeon: [name], MD;  Location: [facility];  Service: ENT;  Laterality: Right;   MICROLARYNGOSCOPY WITH CO2 LASER AND EXCISION OF VOCAL CORD LESION Right 06/26/2022   Procedure: MICROLARYNGOSCOPY WITH VOCAL CORD [facility] OF RIGHT CORD NODULE;  Surgeon: [name], MD;  Location: [place];  Service: ENT;  Laterality: Right;   PARTIAL KNEE ARTHROPLASTY Left 02/12/2016   Procedure: UNICOMPARTMENTAL LEFT KNEE MEDIALLY;  Surgeon: Paralee Cancel, MD;  Location: [facility];  Service: Orthopedics;  Laterality: Left;   PROSTATECTOMY     SKIN  CANCER EXCISION     TONSILLECTOMY     Patient Active Problem List   Diagnosis Date Noted   Actinic keratosis 04/16/2022   Encounter for fitting and adjustment of hearing aid 04/16/2022   Personal history of malignant melanoma of skin 04/16/2022   HLD (hyperlipidemia) 04/16/2022   Ear fullness, right 02/05/2022   Muscle strain of right gluteal region 05/12/2020   Genetic testing 07/28/2019   Family history of genetic mutation for hereditary nonpolyposis colorectal cancer (HNPCC)    Family history of lung cancer    Family history of cancer of male genital organ    Family history of stomach cancer    Family history of pancreatic cancer    Family history of prostate cancer    Dizziness 01/25/2019   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   Squamous cell skin cancer 10/16/2018   GERD (gastroesophageal reflux disease) 10/16/2018   Chronic hoarseness 10/16/2018   Hyperglycemia 10/16/2018   S/P left UKR 02/12/2016   Status post unilateral knee replacement 02/12/2016   External hemorrhoid, bleeding 08/11/2014   Trochanteric bursitis or tendonitis 09/28/2013   Vocal cord nodule 09/01/2013   Chondromalacia patellae 05/31/2013   Greater trochanteric bursitis of left hip 12/23/2012   Degenerative tear of medial meniscus 07/01/2012   Prepatellar bursitis 04/01/2012   Left knee pain 01/23/2012   HOARSENESS 06/20/2010   RECTAL BLEEDING  02/05/2010   Allergic rhinitis 10/04/2009   Melanoma of skin ([place]) 12/30/2007   ADENOCARCINOMA, PROSTATE 12/30/2007   CHRONIC GRANULOMATOUS DISEASE 12/30/2007   Essential hypertension 12/30/2007   ASTHMATIC BRONCHITIS, ACUTE 12/30/2007   DIVERTICULOSIS OF COLON 12/30/2007   RENAL CYST 12/30/2007   DEGENERATIVE [place] DISEASE 12/30/2007   HYPERCHOLESTEROLEMIA 12/22/2007   SPEECH THERAPY RENEWAL-DISCHARGE SUMMARY  Visits from Start of Care: 4  Current functional level related to goals / functional outcomes: See below. Pt will be seen today with LTGs  enforced and then be discharged.   Remaining deficits: None for swallowing. Mild hoarseness.    Education / Equipment: See below.   Patient agrees to discharge. Patient goals were met. Patient is being discharged due to meeting the stated rehab goals..      ONSET DATE: August 2023   REFERRING DIAG: Vocal cord cancer  THERAPY DIAG:  Dysphagia, unspecified type  Rationale for Evaluation and Treatment Rehabilitation  SUBJECTIVE:   SUBJECTIVE STATEMENT: Cereal and fruit, whole wheat toast with peanut butter, poached egg, chicken, veggies, cod in past 3-4 days. Pt accompanied by: self  PERTINENT HISTORY:  Vocal cord cancer, stage II (T2, N0, M0). 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 Since that time he has been followed by Dr. Wilburn Cornelia every 6 months. 06/05/2022 He followed up with Dr. Wilburn Cornelia and reported several weeks of worsening hoarseness and mild vocal discomfort. Laryngoscopy performed during this visit revealed a nodular raised area in the anterior aspect of the right vocal cord. 06/26/22 he underwent vocal cord biopsies with Dr. Wilburn Cornelia. He was found to have 2 mm broad-based papillomatous mass involving the anterior one third of the right true vocal cord and extending onto the superior surface. Pathology from the procedure revealed invasive well-differentiated squamous cell carcinoma. 07/04/22 CT neck revealed no abnormal soft tissue mass or enhancement along the right true vocal fold to suggest residual disease s/p excision. CT also showed no evidence of pathologic lymphadenopathy in the neck. He will receive 20 fractions of radiation to his Larynx.  He starts on 07/29/22 and will complete on 08/23/22.  PAIN:  Are you having pain? No   PATIENT GOALS Maintain WNL swallow  OBJECTIVE:   DIAGNOSTIC FINDINGS:  February 11 2019- IMPRESSION: 1. No acute  intracranial abnormality. 2. 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 3. Minimal chronic small vessel ischemic disease in the cerebral white matter. 4. Mild-to-moderate cerebral atrophy.   TODAY'S TREATMENT:  12/03/22: SLP reiterated overt s/sx that indicate swallowing difficulty, and that with these signs/sx pt should contact referring MD or his ENT. Pt demonstrated understanding. Today Franchot Mimes ate a cereal bar and drank water without any overt s/sx of oral or pharyngeal deficits. Again,  he used water for HEP and was independent with this. Pt told SLP when he can reduce frequency of HEP; pt said he would likely cont to complete it x1/day. Pt thanked SLP for his care.  10/02/22: Pt reports his swallowing is improved. Peanut butter sandwich, applesauce, lentil soup, chicken casserole, green beans, cold cereal with fruit. No coughing reported by pt nor Id'd by wife with POs. Today pt ate cereal bar and drank water without any overt s/sx of oral or pharyngeal deficits. He used water for HEP (independent with this) and had no overt s/sx oral or pharyngeal deficits. Pt also told SLP when he can reduce frequency of HEP. SLP provided pt with overt  s/sx aspiration PNA and he told SLP 3 of these with modified indpendence. SLP and pt agreed that due to WNL HEP and no overt s/sx dysphagia that he could be seen again in 8 weeks. SLP also mentioned some ways to encourage vocal health/hygiene.   09/04/22: Pt arrived today in good spirits appreciative of MBS last week. He has continued performing the HEP throughout his rad tx and afterwards. Franchot Mimes told SLP the reason for his HEP, and completed the HEP with rare min A (hold point for [facility]). SLP reviewed his MBS with him and wife. Pt followed precautions from [place] with independence. "I told her I think my chug-a-lug days are over, Glendell Docker," pt stated. SLP told pt if he cont with HEP his chug-a-lug days might be back again.  07/30/22: Research states the risk for  dysphagia increases due to radiation and/or chemotherapy treatment due to a variety of factors, so SLP educated the pt about the possibility of reduced/limited ability for PO intake during rad tx. SLP also educated pt regarding possible changes to swallowing musculature after rad tx, and why adherence to dysphagia HEP provided today and PO consumption was necessary to inhibit muscle fibrosis following rad tx and to mitigate muscle disuse atrophy. SLP informed pt why this would be detrimental to their swallowing status and to their pulmonary health. Pt demonstrated understanding of these things to SLP. SLP encouraged pt to safely eat and drink as deep into their radiation/chemotherapy as possible to provide the best possible long-term swallowing outcome for pt.    SLP then developed an individualized HEP for pt involving oral and pharyngeal and vocal  strengthening and ROM and pt was instructed how to perform these exercises, including SLP demonstration. After SLP demonstration, pt return demonstrated each exercise. SLP ensured pt performance was correct prior to educating pt on next exercise. Pt required rare min-mod cues faded to modified independent to perform HEP. Pt was instructed to complete this program 6-7 days/week, at least 2 times a day until 6 months after his last day of rad tx, and then x2 a week after that, indefinitely.   PATIENT EDUCATION: Education details: See above in "today's treatment" Person educated: Patient Education method: Explanation Education comprehension: verbalized understanding   ASSESSMENT:  CLINICAL IMPRESSION: Patient is a 89 y.o. male who was seen today for treatment of swallowing as he has completed radiation/chemoradiation therapy. Mild hoarseness exhibited today. Today pt ate items from [place], and drank thin liquids. No overt s/sx oral or pharyngeal difficulty noted with these items. At this time pt swallowing is deemed WNL/WFL with these POs. There are no overt  s/s aspiration PNA observed by SLP nor any reported by pt at this time. Today after session pt will be discharged.   OBJECTIVE IMPAIRMENTS include voice disorder and dysphagia. These impairments are limiting patient from effectively communicating at home and in community and safety when swallowing. Factors affecting potential to achieve goals and functional outcome are  none . Patient will benefit from skilled SLP services to address above impairments and improve overall function.  REHAB POTENTIAL: Good   GOALS: Goals reviewed with patient? No  SHORT TERM GOALS: Target:  3 sessions    (4th therapy session)  pt will complete HEP with rare min A  Baseline: Goal status: Met   pt will tell SLP why pt is completing HEP with modified independence Baseline:  Goal status: Met  pt will describe 3 overt s/s aspiration PNA with modified independence Baseline:  Goal status:  Met  Pt will tell SLP how a food journal could hasten return to a more normalized diet Baseline:  Goal status: Deferred - pt "eating what I've eaten for years"  LONG TERM GOALS: Target:  6 sessions   (7th session)  pt will complete HEP with modified independence over two visits Baseline: 10-02-22 Goal status: Met   2.  pt will describe how to modify HEP over time, and the timeline associated with reduction in HEP frequency with modified independence over two sessions Baseline: 10-02-22 Goal status: Met  PLAN: Discharge after session today.  PLANNED INTERVENTIONS: Aspiration precaution training, Pharyngeal strengthening exercises, Diet toleration management , Environmental controls, Trials of upgraded texture/liquids, Cueing hierachy, SLP instruction and feedback, Compensatory strategies, and Patient/family education    [facility], [place] 12/03/2022, 4:09 PM

## 2023-01-17 ENCOUNTER — Encounter: Payer: Self-pay | Admitting: Family Medicine

## 2023-01-17 ENCOUNTER — Ambulatory Visit (INDEPENDENT_AMBULATORY_CARE_PROVIDER_SITE_OTHER): Payer: Medicare Other

## 2023-01-17 ENCOUNTER — Ambulatory Visit (INDEPENDENT_AMBULATORY_CARE_PROVIDER_SITE_OTHER): Payer: Medicare Other | Admitting: Family Medicine

## 2023-01-17 VITALS — BP 140/78 | HR 75 | Temp 97.3°F | Ht 69.0 in | Wt 130.0 lb

## 2023-01-17 DIAGNOSIS — J309 Allergic rhinitis, unspecified: Secondary | ICD-10-CM

## 2023-01-17 DIAGNOSIS — R5383 Other fatigue: Secondary | ICD-10-CM

## 2023-01-17 DIAGNOSIS — R059 Cough, unspecified: Secondary | ICD-10-CM | POA: Diagnosis not present

## 2023-01-17 LAB — COMPREHENSIVE METABOLIC PANEL
ALT: 14 U/L (ref 0–53)
AST: 20 U/L (ref 0–37)
Albumin: 4.2 g/dL (ref 3.5–5.2)
Alkaline Phosphatase: 48 U/L (ref 39–117)
BUN: 25 mg/dL — ABNORMAL HIGH (ref 6–23)
CO2: 32 mEq/L (ref 19–32)
Calcium: 9.3 mg/dL (ref 8.4–10.5)
Chloride: 104 mEq/L (ref 96–112)
Creatinine, Ser: 0.92 mg/dL (ref 0.40–1.50)
GFR: 72.7 mL/min (ref >60.00)
Glucose, Bld: 98 mg/dL (ref 70–99)
Potassium: 4.6 mEq/L (ref 3.5–5.1)
Sodium: 141 mEq/L (ref 135–145)
Total Bilirubin: 0.4 mg/dL (ref 0.2–1.2)
Total Protein: 7 g/dL (ref 6.0–8.3)

## 2023-01-17 LAB — CBC WITH DIFFERENTIAL/PLATELET
Basophils Absolute: 0 10*3/uL (ref 0.0–0.1)
Basophils Relative: 0.9 % (ref 0.0–3.0)
Eosinophils Absolute: 0.1 10*3/uL (ref 0.0–0.7)
Eosinophils Relative: 1.6 % (ref 0.0–5.0)
HCT: 40 % (ref 39.0–52.0)
Hemoglobin: 13.6 g/dL (ref 13.0–17.0)
Lymphocytes Relative: 27.4 % (ref 12.0–46.0)
Lymphs Abs: 1.5 10*3/uL (ref 0.7–4.0)
MCHC: 34 g/dL (ref 30.0–36.0)
MCV: 95.8 fl (ref 78.0–100.0)
Monocytes Absolute: 0.5 10*3/uL (ref 0.1–1.0)
Monocytes Relative: 8.4 % (ref 3.0–12.0)
Neutro Abs: 3.3 10*3/uL (ref 1.4–7.7)
Neutrophils Relative %: 61.7 % (ref 43.0–77.0)
Platelets: 267 10*3/uL (ref 150.0–400.0)
RBC: 4.17 Mil/uL — ABNORMAL LOW (ref 4.22–5.81)
RDW: 13.4 % (ref 11.5–15.5)
WBC: 5.4 10*3/uL (ref 4.0–10.5)

## 2023-01-17 LAB — TSH: TSH: 2.57 u[IU]/mL (ref 0.35–5.50)

## 2023-01-17 NOTE — Patient Instructions (Signed)
Please go downstairs for labs and a chest X ray before you leave.   Your current symptoms may be related to allergies. Monitor for any new or worsening symptoms.   We will be in touch with your results.

## 2023-01-17 NOTE — Progress Notes (Unsigned)
Subjective:  Darren Edwards is a 89 y.o. male who presents for fatigue x one week. Taking 15 minute walks. Normally runs and takes long walks.   White patches and oncologist did not think it was related to radiation.   Cancer on vocal cord cancer. Had surgery in October.  Having radiation.  Currently having speech therapy.   No ST or neck pain.  Denies fever, chills, dizziness, chest pain, palpitations, shortness of breath, cough, abdominal pain, N/V/D, urinary symptoms, LE edema.   No other aggravating or relieving factors.  No other c/o.  ROS as in subjective.   Objective: Vitals:   01/17/23 1255  BP: (!) 140/78  Pulse: 75  Temp: (!) 97.3 F (36.3 C)  SpO2: 98%    General appearance: Alert, WD/WN, no distress                             Skin: warm, no rash                           Head: no sinus tenderness                            Eyes: conjunctiva normal, corneas clear, PERRLA                            Ears: pearly TMs, external ear canals normal                          Nose: septum midline, turbinates swollen, with erythema and clear discharge             Mouth/throat: MMM, tongue normal, mild pharyngeal erythema                           Neck: supple, no adenopathy, no thyromegaly, nontender                          Heart: RRR                         Lungs: CTA bilaterally, no wheezes, rales, or rhonchi      Assessment: Fatigue, unspecified type - Plan: CBC with Differential/Platelet, Comprehensive metabolic panel, TSH, DG Chest 2 View, TSH, Comprehensive metabolic panel, CBC with Differential/Platelet  Allergic rhinitis, unspecified seasonality, unspecified trigger   Plan: No acute distress.  Check labs and CXR. Recommend close monitoring for any new or worsening symptoms. Treat allergies prn.   Suggested symptomatic OTC remedies. Follow up if any new or worsening symptoms.

## 2023-01-27 DIAGNOSIS — L578 Other skin changes due to chronic exposure to nonionizing radiation: Secondary | ICD-10-CM | POA: Diagnosis not present

## 2023-01-27 DIAGNOSIS — L720 Epidermal cyst: Secondary | ICD-10-CM | POA: Diagnosis not present

## 2023-01-27 DIAGNOSIS — D1801 Hemangioma of skin and subcutaneous tissue: Secondary | ICD-10-CM | POA: Diagnosis not present

## 2023-01-27 DIAGNOSIS — L821 Other seborrheic keratosis: Secondary | ICD-10-CM | POA: Diagnosis not present

## 2023-01-27 DIAGNOSIS — L570 Actinic keratosis: Secondary | ICD-10-CM | POA: Diagnosis not present

## 2023-01-27 DIAGNOSIS — Z85828 Personal history of other malignant neoplasm of skin: Secondary | ICD-10-CM | POA: Diagnosis not present

## 2023-01-27 NOTE — Progress Notes (Signed)
Darren Edwards is here to be seen for follow up for Squamous cell carcinoma of the glottis. He completed treatment on 08-23-22.  Pain issues, if any: Denies any mouth or throat pain Using a feeding tube?: N/A Weight changes, if any:  Wt Readings from Last 3 Encounters:  02/07/23 133 lb (60.3 kg)  01/17/23 130 lb (59 kg)  09/13/22 130 lb (59 kg)   Swallowing issues, if any: Denies--reports he can eat and drink a wide variety  Smoking or chewing tobacco? None Using fluoride trays daily? Uses flouride toothpaste and sees his community dentist regularly  Last ENT visit was on: Saw Dr. Serena Colonel on 11/19/2022:  --Physical Exam:  Healthy-appearing elderly gentleman in no distress. Voice is a little bit hoarse but his breathing is clear. Nasal exam is unremarkable. Oral cavity and pharynx are healthy and clear. Tonsils are absent. Indirect exam of the larynx and hypopharynx reveals slight thickening of the right cord compared to the left. No mucosal irregularities or lesions identified. Cords move normally. Hypopharynx is clear. No palpable adenopathy in the neck.  --Impression & Plans: Stable, no evidence of recurrent disease. Follow-up 3 months or sooner if needed.  Other notable issues, if any: Continues to deal with dry mouth (but reports that is more related to his statin medication). Denies any signs or concerns for swelling or new bumps under his chin/down his neck. Overall reports he feels good and is doing well.

## 2023-02-07 ENCOUNTER — Ambulatory Visit
Admission: RE | Admit: 2023-02-07 | Discharge: 2023-02-07 | Disposition: A | Discharge status: Home / Self Care | Payer: Medicare Other | Source: Ambulatory Visit | Attending: Radiation Oncology | Admitting: Radiation Oncology

## 2023-02-07 VITALS — BP 161/69 | HR 82 | Temp 97.1°F | Resp 18 | Ht 69.0 in | Wt 133.0 lb

## 2023-02-07 DIAGNOSIS — C320 Malignant neoplasm of glottis: Secondary | ICD-10-CM | POA: Insufficient documentation

## 2023-02-07 MED ORDER — OXYMETAZOLINE HCL 0.05 % NA SOLN
2.0000 | Freq: Once | NASAL | Status: AC
  Administered 2023-02-07: 2 via NASAL
  Filled 2023-02-07: qty 30

## 2023-02-11 ENCOUNTER — Encounter: Payer: Self-pay | Admitting: Radiation Oncology

## 2023-02-11 NOTE — Progress Notes (Signed)
Radiation Oncology         [phone] ________________________________  Name: Darren Edwards MRN: [id number]  Date: 02/07/2023  DOB: July 24, 1929  Follow-Up Visit Note  CC: Darren Levins, MD  Darren Coho, MD  Diagnosis and Prior Radiotherapy:       ICD-10-CM   1. Vocal cord cancer  C32.0 oxymetazoline (AFRIN) 0.05 % nasal spray 2 spray    Fiberoptic laryngoscopy     Cancer Staging  Vocal cord cancer Staging form: Larynx - Glottis, AJCC 8th Edition - Clinical stage from 07/05/2022: Stage II (cT2, cN0, cM0) - Signed by Darren Peak, MD on 07/05/2022   Radiation Treatment Dates: 07/29/2022 through 08/23/2022 Site Technique Total Dose (Gy) Dose per Fx (Gy) Completed Fx Beam Energies  Larynx: HN_Larynx 3D 54/54 2.7 20/20 6X    CHIEF COMPLAINT:  Here for follow-up and surveillance of laryngeal cancer  Narrative:   Darren Edwards is here to be seen for follow up for Squamous cell carcinoma of the glottis. He completed treatment on 08-23-22.  Pain issues, if any: Denies any mouth or throat pain Using a feeding tube?: N/A Weight changes, if any:  Wt Readings from Last 3 Encounters:  02/07/23 133 lb (60.3 kg)  01/17/23 130 lb (59 kg)  09/13/22 130 lb (59 kg)   Swallowing issues, if any: Denies--reports he can eat and drink a wide variety  Smoking or chewing tobacco? None Using fluoride trays daily? Uses flouride toothpaste and sees his community dentist regularly  Last ENT visit was on: Saw Dr. Serena Edwards on 11/19/2022:  --Physical Exam:  Healthy-appearing elderly gentleman in no distress. Voice is a little bit hoarse but his breathing is clear. Nasal exam is unremarkable. Oral cavity and pharynx are healthy and clear. Tonsils are absent. Indirect exam of the larynx and hypopharynx reveals slight thickening of the right cord compared to the left. No mucosal irregularities or lesions identified. Cords move normally. Hypopharynx is clear. No palpable adenopathy in the neck.   --Impression & Plans: Stable, no evidence of recurrent disease. Follow-up 3 months or sooner if needed.  Other notable issues, if any: Continues to deal with dry mouth (but reports that is more related to his statin medication). Denies any signs or concerns for swelling or new bumps under his chin/down his neck. Overall reports he feels good and is doing well.                    ALLERGIES:  is allergic to clindamycin and penicillins.  Meds: Current Outpatient Medications  Medication Sig Dispense Refill   Ascorbic Acid (VITAMIN C) 1000 MG tablet Take 1,000 mg by mouth daily. (Patient not taking: Reported on 09/13/2022)     aspirin 81 MG chewable tablet Chew 81 mg by mouth daily.     cholecalciferol (VITAMIN D) 1000 units tablet Take 1,000 Units by mouth daily.     Coenzyme Q10 (CO Q 10) 100 MG CAPS Take 100 mg by mouth daily.  30 capsule 0   Multiple Vitamins-Minerals (ONE-A-DAY MENS 50+ ADVANTAGE) TABS Take 1 tablet by mouth daily after breakfast.     simvastatin (ZOCOR) 20 MG tablet Take 1 tablet (20 mg total) by mouth at bedtime. 90 tablet 3   Zinc 30 MG TABS Take 30 mg by mouth daily.     No current facility-administered medications for this encounter.    Physical Findings: The patient is in no acute distress. Patient is alert and oriented. Wt Readings from Last 3  Encounters:  02/07/23 133 lb (60.3 kg)  01/17/23 130 lb (59 kg)  09/13/22 130 lb (59 kg)    height is 5\' 9"  (1.753 m) and weight is 133 lb (60.3 kg). His temporal temperature is 97.1 F (36.2 C) (abnormal). His blood pressure is 161/69 (abnormal) and his pulse is 82. His respiration is 18 and oxygen saturation is 98%. .  General: Alert and oriented, in no acute distress HEENT: Head is normocephalic. Extraocular movements are intact. Oropharynx is notable for no lesions in the mouth or upper throat Neck: No palpable masses or significant lymphedema Skin: Skin in treatment fields shows satisfactory healing as  above MSK: Well-nourished, well-appearing, ambulatory Psychiatric: Judgment and insight are intact. Affect is appropriate.  PROCEDURE NOTE: After obtaining consent and spraying nasal cavity with topical oxymetazoline, the flexible endoscope was coated with lidocaine gel and introduced and passed through the nasal cavity.  The nasopharynx, oropharynx, hypopharynx, and larynx  were then examined. No lesions appreciated in the mucosal axis. The true cords were symmetrically mobile.  Tolerated the procedure well  Lab Findings: Lab Results  Component Value Date   WBC 5.4 01/17/2023   HGB 13.6 01/17/2023   HCT 40.0 01/17/2023   MCV 95.8 01/17/2023   PLT 267.0 01/17/2023    Lab Results  Component Value Date   TSH 2.57 01/17/2023    Radiographic Findings: DG Chest 2 View  Result Date: 01/17/2023 CLINICAL DATA:  Cough. EXAM: CHEST - 2 VIEW COMPARISON:  None Available. FINDINGS: The heart size and mediastinal contours are within normal limits. Both lungs are clear. The visualized skeletal structures are unremarkable. IMPRESSION: No active cardiopulmonary disease. Electronically Signed   By: Lupita Raider M.D.   On: 01/17/2023 13:57    Impression/Plan:    1) Head and Neck Cancer Status: No evidence of disease  2) Nutritional Status: Doing well, no issues PEG tube: None  3)  Swallowing: Continue exercises as instructed by speech-language pathology.    4)   Thyroid function: Check annually here or with PCP -within normal limits Lab Results  Component Value Date   TSH 2.57 01/17/2023     It was wonderful to see the patient and his family today.  They know to call if they have any issues before his next follow-up with me.  I will see him back in 6 months.  He he and his wife will move back his appointment with ENT for 3 months from now.  On date of service, in total, I spent 30 minutes on this encounter. Patient was seen in person.  Note was signed after date of service and minutes are  pertaining to date of service only. _____________________________________   Darren Peak, MD

## 2023-04-16 DIAGNOSIS — I8312 Varicose veins of left lower extremity with inflammation: Secondary | ICD-10-CM | POA: Diagnosis not present

## 2023-04-16 DIAGNOSIS — Z85828 Personal history of other malignant neoplasm of skin: Secondary | ICD-10-CM | POA: Diagnosis not present

## 2023-04-16 DIAGNOSIS — I8311 Varicose veins of right lower extremity with inflammation: Secondary | ICD-10-CM | POA: Diagnosis not present

## 2023-04-16 DIAGNOSIS — I872 Venous insufficiency (chronic) (peripheral): Secondary | ICD-10-CM | POA: Diagnosis not present

## 2023-04-29 DIAGNOSIS — Z923 Personal history of irradiation: Secondary | ICD-10-CM | POA: Diagnosis not present

## 2023-04-29 DIAGNOSIS — C320 Malignant neoplasm of glottis: Secondary | ICD-10-CM | POA: Diagnosis not present

## 2023-05-06 ENCOUNTER — Other Ambulatory Visit: Payer: Self-pay

## 2023-05-06 ENCOUNTER — Encounter: Payer: Self-pay | Admitting: Sports Medicine

## 2023-05-06 ENCOUNTER — Ambulatory Visit: Payer: Medicare Other | Admitting: Sports Medicine

## 2023-05-06 VITALS — BP 138/62 | Ht 69.0 in | Wt 133.0 lb

## 2023-05-06 DIAGNOSIS — M25571 Pain in right ankle and joints of right foot: Secondary | ICD-10-CM

## 2023-05-06 DIAGNOSIS — M25471 Effusion, right ankle: Secondary | ICD-10-CM | POA: Insufficient documentation

## 2023-05-06 NOTE — Progress Notes (Signed)
Established Patient Office Visit  Subjective   Patient ID: Darren Edwards, male    DOB: 1929/03/19  Age: 89 y.o. MRN: [id number]  Chief Complaint  Patient presents with   Joint Swelling    Right ankle    HPI  Darren Edwards presents today for acute R ankle swelling that occurred approximately 2 weeks ago after having a tick removed from his RLE by dermatology. He was treated w/ Doxycycline prophylactically for 10 days and denies any fevers, chills, joint pain, palpitations or rashes. He wore a compression stocking for 10 days which also resolved most of the edema but he is concerned about his achilles tendon after reading up on the condition.   ROS Negative except for above   Objective:     BP 138/62   Ht 5\' 9"  (1.753 m)   Wt 133 lb (60.3 kg)   BMI 19.64 kg/m  BP Readings from Last 3 Encounters:  05/06/23 138/62  02/07/23 (!) 161/69  01/17/23 (!) 140/78   Wt Readings from Last 3 Encounters:  05/06/23 133 lb (60.3 kg)  02/07/23 133 lb (60.3 kg)  01/17/23 130 lb (59 kg)      Physical Exam  Gen: NAD, not ill appearing Skin: no rashes or jaundice, small 2mm healing scab over the distal lateral/anterior LE above the lateral malleolus Ankle: No visible erythema, trace edema grossly of R compared to L Range of motion is full in all directions. Strength is 5/5 in all directions. No pain w/ resisted motion.  Good subtalar motion Anterior drawer test normal Stable lateral and medial ligaments; squeeze test unremarkable; Talar dome nontender; No pain at base of 5th MT; No tenderness over cuboid; No tenderness over N spot or navicular prominence No tenderness on posterior aspects of lateral and medial malleolus No sign of peroneal tendon subluxations; Able to walk 4 steps.  [place] R Ankle Trace soft tissue edema of the ankle joint but no frank effusion.  Fibularis longus and brevis visualized w/out tears in transfers and longitudinal views. Trace edema surrounding the fibularis  brevis just distal to the malleolus and at the fibularis tubercle Tibialis posterior, flexor digitorum longus, and flexor hallucis longus in tact w/out tearing or edema in the transverse and long views Achilles in tact w/ no thickening in AP or lateral, no tears or edema AT measures at 0.45 cm  Impression: Unremarkable ankle ultrasound with the exception of some soft tissue swelling  Ultrasound and interpretation by Sibyl Parr. Fields, MD     No results found for any visits on 05/06/23.  The ASCVD Risk score (Arnett DK, et al., 2019) failed to calculate for the following reasons:   The 2019 ASCVD risk score is only valid for ages 77 to 4    Assessment & Plan:   Problem List Items Addressed This Visit       Other   Edema of right ankle    Darren. Edwards was seen by dermatology who removed a tick from his distal R lower extremity. He was treated w/ prophylactic Doxycycline and had subsequent edema of the R LE and ankle. His edema has markedly improved sp compression stocking use and he has no pain. He continues to run/walk his normal distances w/out difficulty and no signs of ongoing infectious etiology.  On R ankle [place] he has some trace fluid surrounding the fibularis brevis distal to the malleolus and at the peroneal tubercle as well as some trace soft tissue edema in the ankle joint but  largely normal exam w/ minimal arthritic changes, no tenderness and good ROM.  - Cont normal activity and start daily ABC exercises for ROM and strength - Ice ankle nightly and after strenuous activity - Expect any remaining edema will resolve without further intervention - We will follow up as needed      Other Visit Diagnoses     Right ankle pain, unspecified chronicity    -  Primary   Relevant Orders   [place] LIMITED JOINT SPACE STRUCTURES LOW RIGHT       No follow-ups on file.   Rica Mote MD [facility] Sports Medicine Fellow  I observed and examined the patient with the [facility] resident and  agree with assessment and plan.  Note reviewed and modified by me.  Sterling Big, MD

## 2023-05-06 NOTE — Assessment & Plan Note (Signed)
Darren Edwards was seen by dermatology who removed a tick from his distal R lower extremity. He was treated w/ prophylactic Doxycycline and had subsequent edema of the R LE and ankle. His edema has markedly improved sp compression stocking use and he has no pain. He continues to run/walk his normal distances w/out difficulty and no signs of ongoing infectious etiology.  On R ankle [place] he has some trace fluid surrounding the fibularis brevis distal to the malleolus and at the peroneal tubercle as well as some trace soft tissue edema in the ankle joint but largely normal exam w/ minimal arthritic changes, no tenderness and good ROM.  - Cont normal activity and start daily ABC exercises for ROM and strength - Ice ankle nightly and after strenuous activity - Expect any remaining edema will resolve without further intervention - We will follow up as needed

## 2023-05-09 ENCOUNTER — Ambulatory Visit: Payer: Medicare Other | Admitting: Family Medicine

## 2023-05-29 DIAGNOSIS — L821 Other seborrheic keratosis: Secondary | ICD-10-CM | POA: Diagnosis not present

## 2023-05-29 DIAGNOSIS — L570 Actinic keratosis: Secondary | ICD-10-CM | POA: Diagnosis not present

## 2023-05-29 DIAGNOSIS — Z85828 Personal history of other malignant neoplasm of skin: Secondary | ICD-10-CM | POA: Diagnosis not present

## 2023-06-09 ENCOUNTER — Ambulatory Visit (INDEPENDENT_AMBULATORY_CARE_PROVIDER_SITE_OTHER): Payer: Medicare Other

## 2023-06-09 VITALS — Ht 69.0 in | Wt 133.0 lb

## 2023-06-09 DIAGNOSIS — Z0000 Encounter for general adult medical examination without abnormal findings: Secondary | ICD-10-CM | POA: Diagnosis not present

## 2023-06-09 NOTE — Patient Instructions (Addendum)
Darren Edwards , Thank you for taking time to come for your Medicare Wellness Visit. I appreciate your ongoing commitment to your health goals. Please review the following plan we discussed and let me know if I can assist you in the future.   Referrals/Orders/Follow-Ups/Clinician Recommendations: No  This is a list of the screening recommended for you and due dates:  Health Maintenance  Topic Date Due   DTaP/Tdap/Td vaccine (2 - Td or Tdap) 09/28/2019   COVID-19 Vaccine (8 - 2023-24 season) 09/18/2022   Flu Shot  05/29/2023   Medicare Annual Wellness Visit  06/08/2024   Pneumonia Vaccine  Completed   Zoster (Shingles) Vaccine  Completed   HPV Vaccine  Aged Out    Advanced directives: (Copy Requested) Please bring a copy of your health care power of attorney and living will to the office to be added to your chart at your convenience.  Next Medicare Annual Wellness Visit scheduled for next year: Yes  Preventive Care 87 Years and Older, Male  Preventive care refers to lifestyle choices and visits with your health care provider that can promote health and wellness. What does preventive care include? A yearly physical exam. This is also called an annual well check. Dental exams once or twice a year. Routine eye exams. Ask your health care provider how often you should have your eyes checked. Personal lifestyle choices, including: Daily care of your teeth and gums. Regular physical activity. Eating a healthy diet. Avoiding tobacco and drug use. Limiting alcohol use. Practicing safe sex. Taking low doses of aspirin every day. Taking vitamin and mineral supplements as recommended by your health care provider. What happens during an annual well check? The services and screenings done by your health care provider during your annual well check will depend on your age, overall health, lifestyle risk factors, and family history of disease. Counseling  Your health care provider may ask you  questions about your: Alcohol use. Tobacco use. Drug use. Emotional well-being. Home and relationship well-being. Sexual activity. Eating habits. History of falls. Memory and ability to understand (cognition). Work and work Astronomer. Screening  You may have the following tests or measurements: Height, weight, and BMI. Blood pressure. Lipid and cholesterol levels. These may be checked every 5 years, or more frequently if you are over 35 years old. Skin check. Lung cancer screening. You may have this screening every year starting at age 35 if you have a 30-pack-year history of smoking and currently smoke or have quit within the past 15 years. Fecal occult blood test (FOBT) of the stool. You may have this test every year starting at age 57. Flexible sigmoidoscopy or colonoscopy. You may have a sigmoidoscopy every 5 years or a colonoscopy every 10 years starting at age 72. Prostate cancer screening. Recommendations will vary depending on your family history and other risks. Hepatitis C blood test. Hepatitis B blood test. Sexually transmitted disease (STD) testing. Diabetes screening. This is done by checking your blood sugar (glucose) after you have not eaten for a while (fasting). You may have this done every 1-3 years. Abdominal aortic aneurysm (AAA) screening. You may need this if you are a current or former smoker. Osteoporosis. You may be screened starting at age 55 if you are at high risk. Talk with your health care provider about your test results, treatment options, and if necessary, the need for more tests. Vaccines  Your health care provider may recommend certain vaccines, such as: Influenza vaccine. This is recommended every year.  Tetanus, diphtheria, and acellular pertussis (Tdap, Td) vaccine. You may need a Td booster every 10 years. Zoster vaccine. You may need this after age 7. Pneumococcal 13-valent conjugate (PCV13) vaccine. One dose is recommended after age  87. Pneumococcal polysaccharide (PPSV23) vaccine. One dose is recommended after age 74. Talk to your health care provider about which screenings and vaccines you need and how often you need them. This information is not intended to replace advice given to you by your health care provider. Make sure you discuss any questions you have with your health care provider. Document Released: 11/10/2015 Document Revised: 07/03/2016 Document Reviewed: 08/15/2015 Elsevier Interactive Patient Education  2017 [employer].  Fall Prevention in the Home Falls can cause injuries. They can happen to people of all ages. There are many things you can do to make your home safe and to help prevent falls. What can I do on the outside of my home? Regularly fix the edges of walkways and driveways and fix any cracks. Remove anything that might make you trip as you walk through a door, such as a raised step or threshold. Trim any bushes or trees on the path to your home. Use bright outdoor lighting. Clear any walking paths of anything that might make someone trip, such as rocks or tools. Regularly check to see if handrails are loose or broken. Make sure that both sides of any steps have handrails. Any raised decks and porches should have guardrails on the edges. Have any leaves, snow, or ice cleared regularly. Use sand or salt on walking paths during winter. Clean up any spills in your garage right away. This includes oil or grease spills. What can I do in the bathroom? Use night lights. Install grab bars by the toilet and in the tub and shower. Do not use towel bars as grab bars. Use non-skid mats or decals in the tub or shower. If you need to sit down in the shower, use a plastic, non-slip stool. Keep the floor dry. Clean up any water that spills on the floor as soon as it happens. Remove soap buildup in the tub or shower regularly. Attach bath mats securely with double-sided non-slip rug tape. Do not have throw  rugs and other things on the floor that can make you trip. What can I do in the bedroom? Use night lights. Make sure that you have a light by your bed that is easy to reach. Do not use any sheets or blankets that are too big for your bed. They should not hang down onto the floor. Have a firm chair that has side arms. You can use this for support while you get dressed. Do not have throw rugs and other things on the floor that can make you trip. What can I do in the kitchen? Clean up any spills right away. Avoid walking on wet floors. Keep items that you use a lot in easy-to-reach places. If you need to reach something above you, use a strong step stool that has a grab bar. Keep electrical cords out of the way. Do not use floor polish or wax that makes floors slippery. If you must use wax, use non-skid floor wax. Do not have throw rugs and other things on the floor that can make you trip. What can I do with my stairs? Do not leave any items on the stairs. Make sure that there are handrails on both sides of the stairs and use them. Fix handrails that are broken or loose.  Make sure that handrails are as long as the stairways. Check any carpeting to make sure that it is firmly attached to the stairs. Fix any carpet that is loose or worn. Avoid having throw rugs at the top or bottom of the stairs. If you do have throw rugs, attach them to the floor with carpet tape. Make sure that you have a light switch at the top of the stairs and the bottom of the stairs. If you do not have them, ask someone to add them for you. What else can I do to help prevent falls? Wear shoes that: Do not have high heels. Have rubber bottoms. Are comfortable and fit you well. Are closed at the toe. Do not wear sandals. If you use a stepladder: Make sure that it is fully opened. Do not climb a closed stepladder. Make sure that both sides of the stepladder are locked into place. Ask someone to hold it for you, if  possible. Clearly mark and make sure that you can see: Any grab bars or handrails. First and last steps. Where the edge of each step is. Use tools that help you move around (mobility aids) if they are needed. These include: Canes. Walkers. Scooters. Crutches. Turn on the lights when you go into a dark area. Replace any light bulbs as soon as they burn out. Set up your furniture so you have a clear path. Avoid moving your furniture around. If any of your floors are uneven, fix them. If there are any pets around you, be aware of where they are. Review your medicines with your doctor. Some medicines can make you feel dizzy. This can increase your chance of falling. Ask your doctor what other things that you can do to help prevent falls. This information is not intended to replace advice given to you by your health care provider. Make sure you discuss any questions you have with your health care provider. Document Released: 08/10/2009 Document Revised: 03/21/2016 Document Reviewed: 11/18/2014 Elsevier Interactive Patient Education  2017 [employer].

## 2023-06-09 NOTE — Progress Notes (Signed)
Subjective:   Darren Edwards is a 89 y.o. male who presents for an Initial Medicare Annual Wellness Visit.  Visit Complete: Virtual  I connected with  Darren Edwards on 06/09/23 by a audio enabled telemedicine application and verified that I am speaking with the correct person using two identifiers.  Patient Location: Home  Provider Location: Office/Clinic  I discussed the limitations of evaluation and management by telemedicine. The patient expressed understanding and agreed to proceed.  Vital Signs: Unable to obtain new vitals due to this being a telehealth visit.  Review of Systems     Cardiac Risk Factors include: advanced age (>92men, >50 women);dyslipidemia;male gender     Objective:    Today's Vitals   06/09/23 0901  Weight: 133 lb (60.3 kg)  Height: 5\' 9"  (1.753 m)  PainSc: 0-No pain   Body mass index is 19.64 kg/m.     06/09/2023    9:03 AM 02/07/2023    2:04 PM 09/13/2022    2:12 PM 07/30/2022    3:06 PM 07/30/2022    2:11 PM 07/05/2022   10:54 AM 06/26/2022   10:44 AM  Advanced Directives  Does Patient Have a Medical Advance Directive? Yes Yes Yes Yes Yes Yes Yes  Type of Estate agent of [place];Living will Healthcare Power of [place];Living will Living will Healthcare Power of [place];Living will Healthcare Power of [place];Living will Healthcare Power of [place];Living will Healthcare Power of [place];Living will  Does patient want to make changes to medical advance directive?  No - Patient declined  No - Patient declined No - Guardian declined No - Patient declined No - Patient declined  Copy of Healthcare Power of Attorney in Chart? No - copy requested No - copy requested Yes - validated most recent copy scanned in chart (See row information) No - copy requested No - copy requested No - copy requested No - copy requested    Current Medications (verified) Outpatient Encounter Medications as of 06/09/2023  Medication Sig    Ascorbic Acid (VITAMIN C) 1000 MG tablet Take 1,000 mg by mouth daily. (Patient not taking: Reported on 09/13/2022)   aspirin 81 MG chewable tablet Chew 81 mg by mouth daily.   cholecalciferol (VITAMIN D) 1000 units tablet Take 1,000 Units by mouth daily.   Coenzyme Q10 (CO Q 10) 100 MG CAPS Take 100 mg by mouth daily.    Multiple Vitamins-Minerals (ONE-A-DAY MENS 50+ ADVANTAGE) TABS Take 1 tablet by mouth daily after breakfast.   simvastatin (ZOCOR) 20 MG tablet Take 1 tablet (20 mg total) by mouth at bedtime.   Zinc 30 MG TABS Take 30 mg by mouth daily.   No facility-administered encounter medications on file as of 06/09/2023.    Allergies (verified) Clindamycin and Penicillins   History: Past Medical History:  Diagnosis Date   Arthritis    Knee   Cancer (HCC)    HX SKIN CANCERS / HX PROSTATE CANCER   Family history of cancer of male genital organ    Family history of genetic mutation for hereditary nonpolyposis colorectal cancer (HNPCC)    Family history of lung cancer    Family history of pancreatic cancer    Family history of prostate cancer    Family history of stomach cancer    GERD (gastroesophageal reflux disease)    GERD (gastroesophageal reflux disease) 10/16/2018   Hypercholesteremia    Squamous cell skin cancer 10/16/2018   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 Arlyce Dice   MICROLARYNGOSCOPY W/VOCAL CORD INJECTION Right 07/19/2016   Procedure: MICROLARYNGOSCOPY WITH EXCISION OF RIGHT  VOCAL CORD POLYP;  Surgeon: Osborn Coho, MD;  Location: [facility] OR;  Service: ENT;  Laterality: Right;   MICROLARYNGOSCOPY WITH CO2 LASER AND EXCISION OF VOCAL CORD LESION Right 06/26/2022   Procedure: MICROLARYNGOSCOPY WITH VOCAL CORD [facility] OF RIGHT CORD NODULE;  Surgeon: Osborn Coho, MD;  Location[facility] OR;  Service: ENT;  Laterality: Right;   PARTIAL KNEE  ARTHROPLASTY Left 02/12/2016   Procedure: UNICOMPARTMENTAL LEFT KNEE MEDIALLY;  Surgeon: Durene Romans, MD;  Location: [facility] ORS;  Service: Orthopedics;  Laterality: Left;   PROSTATECTOMY     SKIN CANCER EXCISION     TONSILLECTOMY     Family History  Problem Relation Age of Onset   Cancer Mother 71       "male" cancer   Lung cancer Father        smoker   Lung cancer Sister        smoker, diagnosed mid-60s   Stomach cancer Maternal Uncle        diagnosed early 26s   Cancer Paternal Aunt        liver or pancreatic cancer, diagnosed mid 60s   Lung cancer Paternal Uncle 66   Cancer Paternal Uncle        unknown type, diagnosed late 62s   Prostate cancer Cousin        diagnosed 46s   Pancreatic cancer Cousin        diagnosed 44s   Colon cancer Neg Hx    Colon polyps Neg Hx    Diabetes Neg Hx    Esophageal cancer Neg Hx    Social History   Socioeconomic History   Marital status: Married    Spouse name: Not on file   Number of children: 5   Years of education: Not on file   Highest education level: Not on file  Occupational History   Occupation: Retired  Tobacco Use   Smoking status: Some Days    Types: Cigars   Smokeless tobacco: Never   Tobacco comments:    rare- not in a long time  Vaping Use   Vaping status: Never Used  Substance and Sexual Activity   Alcohol use: Not Currently    Comment: social    Drug use: No   Sexual activity: Not Currently  Other Topics Concern   Not on file  Social History Narrative   Not on file   Social Determinants of Health   Financial Resource Strain: Low Risk  (06/09/2023)   Overall Financial Resource Strain (CARDIA)    Difficulty of Paying Living Expenses: Not hard at all  Food Insecurity: No Food Insecurity (06/09/2023)   Hunger Vital Sign    Worried About Running Out of Food in the Last Year: Never true    Ran Out of Food in the Last Year: Never true  Transportation Needs: No Transportation Needs (06/09/2023)   PRAPARE -  Administrator, Civil Service (Medical): No    Lack of Transportation (Non-Medical): No  Physical Activity: Sufficiently Active (06/09/2023)   Exercise Vital Sign    Days of Exercise per Week: 5 days    Minutes of Exercise per Session: 30 min  Stress: No Stress Concern Present (06/09/2023)   [employer] of Occupational Health - Occupational Stress Questionnaire    Feeling of Stress :  Not at all  Social Connections: Unknown (06/09/2023)   Social Connection and Isolation Panel [NHANES]    Frequency of Communication with Friends and Family: More than three times a week    Frequency of Social Gatherings with Friends and Family: More than three times a week    Attends Religious Services: Not on Marketing executive or Organizations: Yes    Attends Engineer, structural: More than 4 times per year    Marital Status: Married    Tobacco Counseling Ready to quit: Not Answered Counseling given: Not Answered Tobacco comments: rare- not in a long time   Clinical Intake:  Pre-visit preparation completed: Yes  Pain : No/denies pain Pain Score: 0-No pain     BMI - recorded: 19.64 Nutritional Status: BMI of 19-24  Normal Nutritional Risks: None Diabetes: No  How often do you need to have someone help you when you read instructions, pamphlets, or other written materials from your doctor or pharmacy?: 1 - Never What is the last grade level you completed in school?: 3 years of college  Interpreter Needed?: No  Information entered by ::  N. , LPN.   Activities of Daily Living    06/09/2023    9:07 AM 06/26/2022   11:12 AM  In your present state of health, do you have any difficulty performing the following activities:  Hearing? 1 0  Vision? 0 0  Difficulty concentrating or making decisions? 0 0  Walking or climbing stairs? 0 0  Dressing or bathing? 0 0  Doing errands, shopping? 0   Preparing Food and eating ? N   Using the Toilet?  N   In the past six months, have you accidently leaked urine? N   Do you have problems with loss of bowel control? N   Managing your Medications? N   Managing your Finances? N   Housekeeping or managing your Housekeeping? N     Patient Care Team: Corwin Levins, MD as PCP - General (Internal Medicine) Malmfelt, Lise Auer, RN as Oncology Nurse Navigator Lonie Peak, MD as Consulting Physician (Radiation Oncology) Osborn Coho, MD (Inactive) as Consulting Physician (Otolaryngology) [facility] as Consulting Physician (Ophthalmology)  Indicate any recent Medical Services you may have received from other than [facility] providers in the past year (date may be approximate).     Assessment:   This is a routine wellness examination for Jaque.  Hearing/Vision screen Hearing Screening - Comments:: Patient wears hearing aids. Vision Screening - Comments:: Patient wears otc eyeglasses. Patient up to date with annual eye exam with [facility].  Dietary issues and exercise activities discussed:     Goals Addressed   None   Depression Screen    06/09/2023    9:06 AM 04/16/2022    3:44 PM 04/16/2022    3:26 PM 10/16/2018    3:39 PM 09/01/2013   12:00 PM  PHQ 2/9 Scores  PHQ - 2 Score 0 0 0 0 0  PHQ- 9 Score 0  0      Fall Risk    06/09/2023    9:05 AM 01/17/2023    1:06 PM 04/16/2022    3:43 PM 04/16/2022    3:26 PM 10/16/2018    3:39 PM  Fall Risk   Falls in the past year? 0 0 0 0 0  Number falls in past yr: 0 0 0 0 0  Injury with Fall? 0 0 0 0 0  Risk for fall due to : No Fall Risks No Fall Risks     Follow up Falls prevention discussed Falls evaluation completed       MEDICARE RISK AT HOME:  Medicare Risk at Home - 06/09/23 0903     Any stairs in or around the home? Yes    If so, are there any without handrails? No    Home free of loose throw rugs in walkways, pet beds, electrical cords, etc? Yes    Adequate lighting in your home to reduce risk of  falls? Yes    Life alert? No    Use of a cane, walker or w/c? No    Grab bars in the bathroom? Yes    Shower chair or bench in shower? Yes    Elevated toilet seat or a handicapped toilet? No             TIMED UP AND GO:  Was the test performed? No    Cognitive Function:        06/09/2023    9:09 AM  6CIT Screen  What Year? 0 points  What month? 0 points  What time? 0 points  Count back from 20 0 points  Months in reverse 0 points  Repeat phrase 0 points  Total Score 0 points    Immunizations Immunization History  Administered Date(s) Administered   COVID-19, mRNA, vaccine(Comirnaty)12 years and older 07/19/2022   Covid-19, Mrna,Vaccine(Spikevax)40yrs and older 07/24/2022   Fluad Quad(high Dose 65+) 07/24/2022   Influenza Split 08/09/2013   Influenza Whole 08/03/2008, 08/04/2009, 08/07/2010, 06/28/2012   Influenza, High Dose Seasonal PF 07/28/2013, 06/28/2016, 07/23/2016, 07/28/2017, 08/21/2018, 07/27/2019   Influenza,inj,Quad PF,6+ Mos 08/23/2015   Influenza,inj,Quad PF,6-35 Mos 08/11/2014   Influenza-Unspecified 08/28/2000, 09/21/2004, 09/27/2005, 08/28/2006, 11/28/2006, 07/29/2007, 09/03/2008, 08/05/2009, 06/28/2010, 06/29/2011, 06/28/2012, 07/27/2019, 07/19/2022   PFIZER Comirnaty(Gray Top)Covid-19 Tri-Sucrose Vaccine 01/29/2021   PFIZER(Purple Top)SARS-COV-2 Vaccination 11/16/2019, 12/07/2019, 07/22/2020, 01/29/2021, 07/19/2021   Pfizer Covid-19 Vaccine Bivalent Booster 98yrs & up 07/19/2021   Pneumococcal Conjugate-13 02/24/2015   Pneumococcal Polysaccharide-23 10/16/2018   Pneumococcal-Unspecified 09/27/1997   Tdap 09/27/2009   Zoster Recombinant(Shingrix) 02/25/2017, 05/28/2017   Zoster, Live 06/28/2010    TDAP status: Due, Education has been provided regarding the importance of this vaccine. Advised may receive this vaccine at local pharmacy or Health Dept. Aware to provide a copy of the vaccination record if obtained from local pharmacy or Health Dept.  Verbalized acceptance and understanding.  Flu Vaccine status: Up to date  Pneumococcal vaccine status: Up to date  Covid-19 vaccine status: Completed vaccines  Qualifies for Shingles Vaccine? Yes   Zostavax completed Yes   Shingrix Completed?: Yes  Screening Tests Health Maintenance  Topic Date Due   DTaP/Tdap/Td (2 - Td or Tdap) 09/28/2019   COVID-19 Vaccine (8 - 2023-24 season) 09/18/2022   INFLUENZA VACCINE  05/29/2023   Medicare Annual Wellness (AWV)  06/08/2024   Pneumonia Vaccine 34+ Years old  Completed   Zoster Vaccines- Shingrix  Completed   HPV VACCINES  Aged Out    Health Maintenance  Health Maintenance Due  Topic Date Due   DTaP/Tdap/Td (2 - Td or Tdap) 09/28/2019   COVID-19 Vaccine (8 - 2023-24 season) 09/18/2022   INFLUENZA VACCINE  05/29/2023    Colorectal cancer screening: No longer required.   Lung Cancer Screening: (Low Dose CT Chest recommended if Age 28-80 years, 20 pack-year currently smoking OR have quit w/in 15years.) does not qualify.   Lung Cancer Screening Referral: no  Additional  Screening:  Hepatitis C Screening: does not qualify; Completed: no  Vision Screening: Recommended annual ophthalmology exams for early detection of glaucoma and other disorders of the eye. Is the patient up to date with their annual eye exam?  Yes  Who is the provider or what is the name of the office in which the patient attends annual eye exams? [facility] If pt is not established with a provider, would they like to be referred to a provider to establish care? No .   Dental Screening: Recommended annual dental exams for proper oral hygiene  Diabetic Foot Exam: N/A  Community Resource Referral / Chronic Care Management: CRR required this visit?  No   CCM required this visit?  No    Plan:     I have personally reviewed and noted the following in the patient's chart:   Medical and social history Use of alcohol, tobacco or illicit drugs  Current  medications and supplements including opioid prescriptions. Patient is not currently taking opioid prescriptions. Functional ability and status Nutritional status Physical activity Advanced directives List of other physicians Hospitalizations, surgeries, and ER visits in previous 12 months Vitals Screenings to include cognitive, depression, and falls Referrals and appointments  In addition, I have reviewed and discussed with patient certain preventive protocols, quality metrics, and best practice recommendations. A written personalized care plan for preventive services as well as general preventive health recommendations were provided to patient.     Mickeal Needy, LPN   1/61/0960   After Visit Summary: (Mail) Due to this being a telephonic visit, the after visit summary with patients personalized plan was offered to patient via mail   Nurse Notes: Normal cognitive status assessed by direct observation via telephone conversation by this Nurse Health Advisor. No abnormalities found.

## 2023-08-01 NOTE — Progress Notes (Signed)
[name] is here to be seen for follow up for Squamous cell carcinoma of the glottis. He completed treatment on 08-23-22.   Pain issues, if any: denies pain Using a feeding tube?: never had feeding tube Weight changes, if any:  Wt Readings from Last 3 Encounters:  08/15/23 130 lb (59 kg)  06/09/23 133 lb (60.3 kg)  05/06/23 133 lb (60.3 kg)    Swallowing issues, if any: no concerns Smoking or chewing tobacco? Does not use Using fluoride toothpaste daily? none Last ENT visit was on: Dr. Pollyann Kennedy 04-29-23 Other notable issues, if any: Pt doing well with no major concerns.   Vitals:   08/15/23 1355  BP: (!) 168/74  Pulse: 73  Resp: 20  Temp: (!) 97.3 F (36.3 C)  SpO2: 100%     04-29-23 Susy Frizzle, MD   Physical Exam:   Healthy-appearing elderly gentleman in no distress. Breathing is clear. Voice is slightly rough. Intranasal exam is unremarkable. Oral cavity and pharynx are healthy and clear. Indirect laryngoscopy reveals healthy mobile cords. No lesions identified. No palpable adenopathy.  Independent Review of Additional Tests or Records:  none  Procedures:  none  Impression & Plans:  No evidence of recurrent disease. Recheck 3 months or sooner as needed.

## 2023-08-14 DIAGNOSIS — Z85828 Personal history of other malignant neoplasm of skin: Secondary | ICD-10-CM | POA: Diagnosis not present

## 2023-08-14 DIAGNOSIS — C44729 Squamous cell carcinoma of skin of left lower limb, including hip: Secondary | ICD-10-CM | POA: Diagnosis not present

## 2023-08-15 ENCOUNTER — Ambulatory Visit
Admission: RE | Admit: 2023-08-15 | Discharge: 2023-08-15 | Disposition: A | Discharge status: Home / Self Care | Payer: Medicare Other | Source: Ambulatory Visit | Attending: Radiation Oncology | Admitting: Radiation Oncology

## 2023-08-15 ENCOUNTER — Encounter: Payer: Self-pay | Admitting: Radiation Oncology

## 2023-08-15 VITALS — BP 162/70 | HR 73 | Temp 97.3°F | Resp 20 | Ht 69.0 in | Wt 130.0 lb

## 2023-08-15 DIAGNOSIS — Z7982 Long term (current) use of aspirin: Secondary | ICD-10-CM | POA: Diagnosis not present

## 2023-08-15 DIAGNOSIS — Z79899 Other long term (current) drug therapy: Secondary | ICD-10-CM | POA: Diagnosis not present

## 2023-08-15 DIAGNOSIS — C320 Malignant neoplasm of glottis: Secondary | ICD-10-CM | POA: Insufficient documentation

## 2023-08-15 DIAGNOSIS — Z923 Personal history of irradiation: Secondary | ICD-10-CM | POA: Insufficient documentation

## 2023-08-15 MED ORDER — OXYMETAZOLINE HCL 0.05 % NA SOLN
1.0000 | Freq: Two times a day (BID) | NASAL | Status: DC
  Administered 2023-08-15: 1 via NASAL

## 2023-08-15 MED ORDER — OXYMETAZOLINE HCL 0.05 % NA SOLN
1.0000 | Freq: Once | NASAL | Status: AC
  Administered 2023-08-15: 1 via NASAL
  Filled 2023-08-15: qty 30

## 2023-08-15 NOTE — Progress Notes (Signed)
Radiation Oncology         [phone] ________________________________  Name: Darren Edwards MRN: [id number]  Date: 08/15/2023  DOB: Mar 14, 1929  Follow-Up Visit Note  CC: Darren Levins, MD  Darren Coho, MD  Diagnosis and Prior Radiotherapy:       ICD-10-CM   1. Vocal cord cancer (HCC)  C32.0 oxymetazoline (AFRIN) 0.05 % nasal spray 1 spray    DISCONTINUED: oxymetazoline (AFRIN) 0.05 % nasal spray 1 spray     Cancer Staging  Vocal cord cancer (HCC) Staging form: Larynx - Glottis, AJCC 8th Edition - Clinical stage from 07/05/2022: Stage II (cT2, cN0, cM0) - Signed by Lonie Peak, MD on 07/05/2022   Radiation Treatment Dates: 07/29/2022 through 08/23/2022 Site Technique Total Dose (Gy) Dose per Fx (Gy) Completed Fx Beam Energies  Larynx: HN_Larynx 3D 54/54 2.7 20/20 6X    CHIEF COMPLAINT:  Here for follow-up and surveillance of laryngeal cancer  Narrative:    Darren Edwards is here to be seen for follow up for Squamous cell carcinoma of the glottis. He completed treatment on 08-23-22.   Pain issues, if any: denies pain Using a feeding tube?: never had feeding tube Weight changes, if any:  Wt Readings from Last 3 Encounters:  08/15/23 130 lb (59 kg)  06/09/23 133 lb (60.3 kg)  05/06/23 133 lb (60.3 kg)    Swallowing issues, if any: no concerns Smoking or chewing tobacco? Does not use Using fluoride toothpaste daily? none Last ENT visit was on: Dr. Pollyann Edwards 04-29-23 Other notable issues, if any: Pt doing well with no major concerns.   Vitals:   08/15/23 1355 08/15/23 1412  BP: (!) 168/74 (!) 162/70  Pulse: 73   Resp: 20   Temp: (!) 97.3 F (36.3 C)   SpO2: 100%      04-29-23 Darren Frizzle, MD   Physical Exam:   Healthy-appearing elderly gentleman in no distress. Breathing is clear. Voice is slightly rough. Intranasal exam is unremarkable. Oral cavity and pharynx are healthy and clear. Indirect laryngoscopy reveals healthy mobile cords. No lesions identified. No  palpable adenopathy.  Independent Review of Additional Tests or Records:  none  Procedures:  none  Impression & Plans:  No evidence of recurrent disease. Recheck 3 months or sooner as needed.   ALLERGIES:  is allergic to clindamycin and penicillins.  Meds: Current Outpatient Medications  Medication Sig Dispense Refill   Ascorbic Acid (VITAMIN C) 1000 MG tablet Take 1,000 mg by mouth daily.     aspirin 81 MG chewable tablet Chew 81 mg by mouth daily.     cholecalciferol (VITAMIN D) 1000 units tablet Take 1,000 Units by mouth daily.     Coenzyme Q10 (CO Q 10) 100 MG CAPS Take 100 mg by mouth daily.  30 capsule 0   Multiple Vitamins-Minerals (ONE-A-DAY MENS 50+ ADVANTAGE) TABS Take 1 tablet by mouth daily after breakfast.     simvastatin (ZOCOR) 20 MG tablet Take 1 tablet (20 mg total) by mouth at bedtime. 90 tablet 3   Zinc 30 MG TABS Take 30 mg by mouth daily.     No current facility-administered medications for this encounter.    Physical Findings: The patient is in no acute distress. Patient is alert and oriented. Wt Readings from Last 3 Encounters:  08/15/23 130 lb (59 kg)  06/09/23 133 lb (60.3 kg)  05/06/23 133 lb (60.3 kg)    height is 5\' 9"  (1.753 m) and weight is 130 lb (59 kg).  His temporal temperature is 97.3 F (36.3 C) (abnormal). His blood pressure is 162/70 (abnormal) and his pulse is 73. His respiration is 20 and oxygen saturation is 100%. .  General: Alert and oriented, in no acute distress HEENT: Head is normocephalic. Extraocular movements are intact. Oropharynx is notable for no lesions in the mouth or upper throat Heart RRR Chest CTAB Abd: soft NT ND Neck: No palpable masses or significant lymphedema Skin: Skin in treatment fields shows satisfactory healing as above MSK: Well-nourished, well-appearing, ambulatory Psychiatric: Judgment and insight are intact. Affect is appropriate.  PROCEDURE NOTE: After obtaining consent and spraying nasal cavity with  topical oxymetazoline, the flexible endoscope was coated with lidocaine gel and introduced and passed through the nasal cavity.  The nasopharynx, oropharynx, hypopharynx, and larynx  were then examined. No lesions appreciated in the mucosal axis. The true cords were symmetrically mobile.  Tolerated the procedure well  Lab Findings: Lab Results  Component Value Date   WBC 5.4 01/17/2023   HGB 13.6 01/17/2023   HCT 40.0 01/17/2023   MCV 95.8 01/17/2023   PLT 267.0 01/17/2023    Lab Results  Component Value Date   TSH 2.57 01/17/2023    Radiographic Findings: No results found.  Impression/Plan:    1) Head and Neck Cancer Status: No evidence of disease  2) Nutritional Status: Doing well, no issues PEG tube: None  3)  Swallowing: Continue exercises as instructed by speech-language pathology.    4)   Thyroid function: Checking annually with PCP -within normal limits Lab Results  Component Value Date   TSH 2.57 01/17/2023     It was wonderful to see the patient and his family today.  They know to call if they have any issues before his next follow-up with me.  I will see him back in 6 months.    On date of service, in total, I spent 30 minutes on this encounter. Patient was seen in person.   _____________________________________   Lonie Peak, MD

## 2023-08-19 DIAGNOSIS — Z23 Encounter for immunization: Secondary | ICD-10-CM | POA: Diagnosis not present

## 2023-09-16 ENCOUNTER — Encounter: Payer: Self-pay | Admitting: Internal Medicine

## 2023-09-16 ENCOUNTER — Ambulatory Visit (INDEPENDENT_AMBULATORY_CARE_PROVIDER_SITE_OTHER): Payer: Medicare Other | Admitting: Internal Medicine

## 2023-09-16 VITALS — BP 130/68 | HR 77 | Temp 98.2°F | Ht 69.0 in | Wt 131.0 lb

## 2023-09-16 DIAGNOSIS — J029 Acute pharyngitis, unspecified: Secondary | ICD-10-CM | POA: Diagnosis not present

## 2023-09-16 LAB — POCT RAPID STREP A (OFFICE): Rapid Strep A Screen: NEGATIVE

## 2023-09-16 MED ORDER — CEFDINIR 300 MG PO CAPS: 300.0000 mg | ORAL_CAPSULE | Freq: Two times a day (BID) | ORAL | 0 refills | Status: AC

## 2023-09-16 NOTE — Patient Instructions (Addendum)
      You do not have strep.    Medications changes include :   antibiotic twice daily for 10 days      Return if symptoms worsen or fail to improve.

## 2023-09-16 NOTE — Progress Notes (Signed)
Subjective:    Patient ID: Darren Edwards, male    DOB: Nov 20, 1928, 89 y.o.   MRN: [id number]      HPI Jazir is here for  Chief Complaint  Patient presents with   Fatigue    Wife noted he had white patches in his throat; Occasional cough with mucus    He is here for an acute visit for cold symptoms. Wife has been sick.  She is on a zpak.    His symptoms started 5 days ago   He is experiencing significant fatigue, possible low-grade fever, nasal congestion, very sore throat-his wife saw some white spots in the back of his throat at 1 point, hoarseness.  He denies any cough, wheeze or shortness of breath.  He denies sinus pressure.  He has tried taking OTC cold medications and gargling with salt water without much improvement   Covid neg today.  Strep neg.      Medications and allergies reviewed with patient and updated if appropriate.  Current Outpatient Medications on File Prior to Visit  Medication Sig Dispense Refill   Ascorbic Acid (VITAMIN C) 1000 MG tablet Take 1,000 mg by mouth daily.     aspirin 81 MG chewable tablet Chew 81 mg by mouth daily.     cholecalciferol (VITAMIN D) 1000 units tablet Take 1,000 Units by mouth daily.     Coenzyme Q10 (CO Q 10) 100 MG CAPS Take 100 mg by mouth daily.  30 capsule 0   Multiple Vitamins-Minerals (ONE-A-DAY MENS 50+ ADVANTAGE) TABS Take 1 tablet by mouth daily after breakfast.     simvastatin (ZOCOR) 20 MG tablet Take 1 tablet (20 mg total) by mouth at bedtime. 90 tablet 3   Zinc 30 MG TABS Take 30 mg by mouth daily.     No current facility-administered medications on file prior to visit.    Review of Systems  Constitutional:  Positive for fatigue. Negative for fever.  HENT:  Positive for congestion, sore throat (white patches seen by wife) and voice change. Negative for ear pain, postnasal drip, sinus pressure and sinus pain.   Respiratory:  Negative for cough, shortness of breath and wheezing.   Gastrointestinal:   Negative for diarrhea and nausea.  Endocrine: Positive for polyphagia.  Musculoskeletal:  Negative for myalgias.  Neurological:  Positive for light-headedness (chronic). Negative for headaches.       Objective:   Vitals:   09/16/23 1558  BP: 130/68  Pulse: 77  Temp: 98.2 F (36.8 C)  SpO2: 95%   BP Readings from Last 3 Encounters:  09/16/23 130/68  08/15/23 (!) 162/70  05/06/23 138/62   Wt Readings from Last 3 Encounters:  09/16/23 131 lb (59.4 kg)  08/15/23 130 lb (59 kg)  06/09/23 133 lb (60.3 kg)   Body mass index is 19.35 kg/m.    Physical Exam Constitutional:      General: He is not in acute distress.    Appearance: Normal appearance. He is not ill-appearing.  HENT:     Head: Normocephalic.     Right Ear: Tympanic membrane, ear canal and external ear normal. There is no impacted cerumen.     Left Ear: Tympanic membrane, ear canal and external ear normal. There is no impacted cerumen.     Mouth/Throat:     Mouth: Mucous membranes are moist.     Pharynx: Posterior oropharyngeal erythema present. No oropharyngeal exudate.  Eyes:     Conjunctiva/sclera: Conjunctivae normal.  Cardiovascular:  Rate and Rhythm: Normal rate and regular rhythm.  Pulmonary:     Effort: Pulmonary effort is normal. No respiratory distress.     Breath sounds: Normal breath sounds. No wheezing or rales.  Musculoskeletal:     Cervical back: Neck supple. No tenderness.  Lymphadenopathy:     Cervical: No cervical adenopathy.  Skin:    General: Skin is warm and dry.     Findings: No rash.  Neurological:     Mental Status: He is alert.            Assessment & Plan:    Sore throat: Acute His wife is sick and currently on an antibiotic Has significant fatigue, low-grade fever and sore throat COVID test negative at home Rapid strep negative here Concern for bacterial cause so I will go ahead and start antibiotic-Omnicef 300 mg twice daily x 10 days Over-the-counter cold  medications, rest, fluids Advised to call or return if not feeling better

## 2023-09-18 ENCOUNTER — Encounter: Payer: Self-pay | Admitting: Internal Medicine

## 2023-09-19 MED ORDER — NYSTATIN 100000 UNIT/ML MT SUSP: 5.0000 mL | Freq: Four times a day (QID) | OROMUCOSAL | 0 refills | Status: DC

## 2023-10-03 DIAGNOSIS — Z923 Personal history of irradiation: Secondary | ICD-10-CM | POA: Diagnosis not present

## 2023-10-03 DIAGNOSIS — Z8521 Personal history of malignant neoplasm of larynx: Secondary | ICD-10-CM | POA: Diagnosis not present

## 2023-10-03 DIAGNOSIS — H938X2 Other specified disorders of left ear: Secondary | ICD-10-CM | POA: Diagnosis not present

## 2023-11-05 DIAGNOSIS — H02882 Meibomian gland dysfunction right lower eyelid: Secondary | ICD-10-CM | POA: Diagnosis not present

## 2023-11-14 DIAGNOSIS — H00022 Hordeolum internum right lower eyelid: Secondary | ICD-10-CM | POA: Diagnosis not present

## 2023-11-14 DIAGNOSIS — H43811 Vitreous degeneration, right eye: Secondary | ICD-10-CM | POA: Diagnosis not present

## 2023-11-14 DIAGNOSIS — H02834 Dermatochalasis of left upper eyelid: Secondary | ICD-10-CM | POA: Diagnosis not present

## 2023-11-14 DIAGNOSIS — H0102A Squamous blepharitis right eye, upper and lower eyelids: Secondary | ICD-10-CM | POA: Diagnosis not present

## 2023-11-14 DIAGNOSIS — H2513 Age-related nuclear cataract, bilateral: Secondary | ICD-10-CM | POA: Diagnosis not present

## 2023-11-14 DIAGNOSIS — H02831 Dermatochalasis of right upper eyelid: Secondary | ICD-10-CM | POA: Diagnosis not present

## 2023-11-14 DIAGNOSIS — H0102B Squamous blepharitis left eye, upper and lower eyelids: Secondary | ICD-10-CM | POA: Diagnosis not present

## 2023-11-18 DIAGNOSIS — Z85828 Personal history of other malignant neoplasm of skin: Secondary | ICD-10-CM | POA: Diagnosis not present

## 2023-11-18 DIAGNOSIS — C44519 Basal cell carcinoma of skin of other part of trunk: Secondary | ICD-10-CM | POA: Diagnosis not present

## 2023-11-18 DIAGNOSIS — D485 Neoplasm of uncertain behavior of skin: Secondary | ICD-10-CM | POA: Diagnosis not present

## 2023-11-18 DIAGNOSIS — D692 Other nonthrombocytopenic purpura: Secondary | ICD-10-CM | POA: Diagnosis not present

## 2023-11-18 DIAGNOSIS — B0089 Other herpesviral infection: Secondary | ICD-10-CM | POA: Diagnosis not present

## 2023-11-18 DIAGNOSIS — C44612 Basal cell carcinoma of skin of right upper limb, including shoulder: Secondary | ICD-10-CM | POA: Diagnosis not present

## 2023-11-18 DIAGNOSIS — L570 Actinic keratosis: Secondary | ICD-10-CM | POA: Diagnosis not present

## 2023-11-18 DIAGNOSIS — L111 Transient acantholytic dermatosis [Grover]: Secondary | ICD-10-CM | POA: Diagnosis not present

## 2023-11-18 DIAGNOSIS — L821 Other seborrheic keratosis: Secondary | ICD-10-CM | POA: Diagnosis not present

## 2023-11-24 DIAGNOSIS — H02834 Dermatochalasis of left upper eyelid: Secondary | ICD-10-CM | POA: Diagnosis not present

## 2023-11-24 DIAGNOSIS — H0102A Squamous blepharitis right eye, upper and lower eyelids: Secondary | ICD-10-CM | POA: Diagnosis not present

## 2023-11-24 DIAGNOSIS — H2513 Age-related nuclear cataract, bilateral: Secondary | ICD-10-CM | POA: Diagnosis not present

## 2023-11-24 DIAGNOSIS — H0102B Squamous blepharitis left eye, upper and lower eyelids: Secondary | ICD-10-CM | POA: Diagnosis not present

## 2023-11-24 DIAGNOSIS — H43811 Vitreous degeneration, right eye: Secondary | ICD-10-CM | POA: Diagnosis not present

## 2023-11-24 DIAGNOSIS — H00022 Hordeolum internum right lower eyelid: Secondary | ICD-10-CM | POA: Diagnosis not present

## 2023-11-24 DIAGNOSIS — H02831 Dermatochalasis of right upper eyelid: Secondary | ICD-10-CM | POA: Diagnosis not present

## 2024-02-13 NOTE — Progress Notes (Signed)
 Mr. Hoose presents for a follow up appointment with Dr. Lurena Sally. He completed radiation therapy on   Pain issues, if any: Patient denies Using a feeding tube?: Patient denies  Weight changes, if any: Patients weight has stayed the same Swallowing issues, if any: Patient denies Smoking or chewing tobacco? Patient denies Using fluoride toothpaste daily? Patient denies Last ENT visit was on: January 2025 Other notable issues, if any:  None  BP (!) 159/76 (BP Location: Left Arm, Patient Position: Sitting)   Pulse 72   Temp (!) 97.3 F (36.3 C) (Temporal)   Resp 18   Ht 5\' 9"  (1.753 m)   Wt 129 lb 6 oz (58.7 kg)   SpO2 100%   BMI 19.11 kg/m   Wt Readings from Last 3 Encounters:  02/20/24 129 lb 6 oz (58.7 kg)  09/16/23 131 lb (59.4 kg)  08/15/23 130 lb (59 kg)

## 2024-02-20 ENCOUNTER — Ambulatory Visit
Admission: RE | Admit: 2024-02-20 | Discharge: 2024-02-20 | Disposition: A | Discharge status: Home / Self Care | Payer: Self-pay | Source: Ambulatory Visit | Attending: Radiation Oncology | Admitting: Radiation Oncology

## 2024-02-20 VITALS — BP 159/76 | HR 72 | Temp 97.3°F | Resp 18 | Ht 69.0 in | Wt 129.4 lb

## 2024-02-20 DIAGNOSIS — Z923 Personal history of irradiation: Secondary | ICD-10-CM | POA: Diagnosis not present

## 2024-02-20 DIAGNOSIS — Z79899 Other long term (current) drug therapy: Secondary | ICD-10-CM | POA: Diagnosis not present

## 2024-02-20 DIAGNOSIS — Z7982 Long term (current) use of aspirin: Secondary | ICD-10-CM | POA: Insufficient documentation

## 2024-02-20 DIAGNOSIS — C320 Malignant neoplasm of glottis: Secondary | ICD-10-CM | POA: Diagnosis not present

## 2024-02-20 NOTE — Progress Notes (Signed)
 Oncology Nurse Navigator Documentation   I met with Darren Edwards and his wife during his follow up with Dr. Lurena Sally today. He continues to recover well from his treatment for his head and neck cancer.   I sent a fax to [facility] ENT Scheduling with request Mr. Kalla be contacted and scheduled for routine post-RT follow-up with Dr. Donalee Fruits in 4 months.  Notification of successful fax transmission received.   He will see Ellie PA in 8 months for continued evaluation by radiation oncology.  He and his wife know to call me if they have any questions or concerns as needed beforehand.   Lynetta Saran RN, BSN, OCN Head & Neck Oncology Nurse Navigator [facility] Phone # [phone]  Fax # [phone]

## 2024-02-20 NOTE — Progress Notes (Signed)
 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 Agosto presents for a follow up appointment with Dr. Lurena Sally. He completed radiation therapy on   Pain issues, if any: Patient denies Using a feeding tube?: Patient denies  Weight changes, if any: Patients weight has stayed the same Swallowing issues, if any: Patient denies Smoking or chewing tobacco? Patient denies Using fluoride toothpaste daily? Patient denies Last ENT visit was on: January 2025 Other notable issues, if any:  None  BP (!) 159/76 (BP Location: Left Arm, Patient Position: Sitting)   Pulse 72   Temp (!) 97.3 F (36.3 C) (Temporal)   Resp 18   Ht 5\' 9"  (1.753 m)   Wt 129 lb 6 oz (58.7 kg)   SpO2 100%   BMI 19.11 kg/m   Wt Readings from Last 3 Encounters:  02/20/24 129 lb 6 oz (58.7 kg)  09/16/23 131 lb (59.4 kg)  08/15/23 130 lb (59 kg)         ALLERGIES:  is allergic to clindamycin and penicillins.  Meds: Current Outpatient Medications  Medication Sig Dispense Refill   Ascorbic Acid (VITAMIN C) 1000 MG tablet Take 1,000 mg by mouth daily.     aspirin  81 MG chewable tablet Chew 81 mg by mouth daily.     cefdinir  (OMNICEF ) 300 MG capsule Take 1 capsule (300 mg total) by mouth 2 (two)  times daily. 20 capsule 0   cholecalciferol (VITAMIN D ) 1000 units tablet Take 1,000 Units by mouth daily.     Coenzyme Q10 (CO Q 10) 100 MG CAPS Take 100 mg by mouth daily.  30 capsule 0   Multiple Vitamins-Minerals (ONE-A-DAY MENS 50+ ADVANTAGE) TABS Take 1 tablet by mouth daily after breakfast.     nystatin  (MYCOSTATIN ) 100000 UNIT/ML suspension Take 5 mLs (500,000 Units total) by mouth 4 (four) times daily. Use for 10 days - swish and spit 200 mL 0   simvastatin  (ZOCOR ) 20 MG tablet Take 1 tablet (20 mg total) by mouth at bedtime. 90 tablet 3   Zinc 30 MG TABS Take 30 mg by mouth daily.     No current facility-administered medications for this encounter.    Physical Findings: The patient is in no acute distress. Patient is alert and oriented. Wt Readings from Last 3 Encounters:  02/20/24 129 lb 6 oz (58.7 kg)  09/16/23 131 lb (59.4 kg)  08/15/23 130 lb (59 kg)    height is 5\' 9"  (1.753 m) and weight is 129 lb 6 oz (58.7 kg). His temporal temperature is 97.3 F (36.3 C) (abnormal). His blood pressure is 159/76 (abnormal) and his pulse is 72. His respiration is 18 and oxygen saturation is  100%. .  General: Alert and oriented, in no acute distress HEENT: Head is normocephalic. Extraocular movements are intact. Oropharynx is notable for no lesions in the mouth or upper throat Neck: No palpable masses or significant lymphedema Skin: Skin in treatment fields shows satisfactory healing as above MSK: Well-nourished, well-appearing, ambulatory Psychiatric: Judgment and insight are intact. Affect is appropriate.  PROCEDURE NOTE: After obtaining consent and spraying nasal cavity with topical lidocaine  and oxymetazoline , the flexible endoscope was coated with lidocaine  gel and introduced and passed through the nasal cavity.  The nasopharynx, oropharynx, hypopharynx, and larynx  were then examined. No lesions appreciated in the mucosal axis. The true cords were symmetrically mobile.  Right cord is  mildly more erythematous than left which is not a clinical concern.  No nodules.  Tolerated the procedure well  Lab Findings: Lab Results  Component Value Date   WBC 5.4 01/17/2023   HGB 13.6 01/17/2023   HCT 40.0 01/17/2023   MCV 95.8 01/17/2023   PLT 267.0 01/17/2023    Lab Results  Component Value Date   TSH 2.57 01/17/2023    Radiographic Findings: No results found.  Impression/Plan:    1) Head and Neck Cancer Status: No evidence of disease  2) Nutritional Status: Doing well, no issues PEG tube: None  3)  Swallowing: Continue exercises as instructed by speech-language pathology.    4)   Thyroid  function: Checking annually with [facility] PCP -within normal limits as of Feb 2025   It was wonderful to see the patient and his family today.  They know to call if they have any issues before his next follow-up with me.  I will see him back in 8 months.  Recommend ENT f/u in 30mo.  On date of service, in total, I spent 25 minutes on this encounter. Patient was seen in person.   _____________________________________   Colie Dawes, MD

## 2024-03-18 DIAGNOSIS — L570 Actinic keratosis: Secondary | ICD-10-CM | POA: Diagnosis not present

## 2024-03-18 DIAGNOSIS — C44722 Squamous cell carcinoma of skin of right lower limb, including hip: Secondary | ICD-10-CM | POA: Diagnosis not present

## 2024-03-18 DIAGNOSIS — D3615 Benign neoplasm of peripheral nerves and autonomic nervous system of abdomen: Secondary | ICD-10-CM | POA: Diagnosis not present

## 2024-03-18 DIAGNOSIS — L821 Other seborrheic keratosis: Secondary | ICD-10-CM | POA: Diagnosis not present

## 2024-03-18 DIAGNOSIS — D0472 Carcinoma in situ of skin of left lower limb, including hip: Secondary | ICD-10-CM | POA: Diagnosis not present

## 2024-03-18 DIAGNOSIS — D485 Neoplasm of uncertain behavior of skin: Secondary | ICD-10-CM | POA: Diagnosis not present

## 2024-03-18 DIAGNOSIS — L718 Other rosacea: Secondary | ICD-10-CM | POA: Diagnosis not present

## 2024-03-18 DIAGNOSIS — Z85828 Personal history of other malignant neoplasm of skin: Secondary | ICD-10-CM | POA: Diagnosis not present

## 2024-03-29 ENCOUNTER — Other Ambulatory Visit: Payer: Self-pay

## 2024-03-29 ENCOUNTER — Ambulatory Visit (INDEPENDENT_AMBULATORY_CARE_PROVIDER_SITE_OTHER): Admitting: Family Medicine

## 2024-03-29 ENCOUNTER — Ambulatory Visit
Admission: RE | Admit: 2024-03-29 | Discharge: 2024-03-29 | Disposition: A | Discharge status: Home / Self Care | Source: Ambulatory Visit | Attending: Family Medicine | Admitting: Family Medicine

## 2024-03-29 VITALS — BP 147/57 | Ht 69.0 in | Wt 129.0 lb

## 2024-03-29 DIAGNOSIS — M25551 Pain in right hip: Secondary | ICD-10-CM

## 2024-03-29 NOTE — Patient Instructions (Signed)
 Get x-rays after you leave today - we will call you with the results. Activities as tolerated unless we tell you otherwise. Icing, tylenol  if needed.

## 2024-03-29 NOTE — Progress Notes (Signed)
 PCP: Roslyn Coombe, MD  Subjective:   HPI: Patient is a 89 y.o. male here for right hip pain after a fall.  Darren Edwards states that on 5/31 he was playing basketball when he lost his balance and fell on his right hip. He was able to stand afterwards and did not feel any popping or crunching in the joint. Since then he has had pain in his lateral hip that is made worse by walking up stairs and laying on his right side. He does not have any pain at rest and does not endorse any radicular symptoms to his leg or groin. He has used ice and tylenol  to control the pain. He states that this morning he was able to do his regular exercise routine of [employer] and weight training without having any pain. He does not have any history of osteoporosis and has never had trauma to that hip.  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     aspirin  81 MG chewable tablet Chew 81 mg by mouth daily.     cefdinir  (OMNICEF ) 300 MG capsule Take 1 capsule (300 mg total) by mouth 2 (two) times daily. 20 capsule 0   cholecalciferol (VITAMIN D ) 1000 units tablet Take 1,000 Units by mouth daily.     Coenzyme Q10 (CO Q 10) 100 MG CAPS Take 100 mg by mouth daily.  30 capsule 0    Multiple Vitamins-Minerals (ONE-A-DAY MENS 50+ ADVANTAGE) TABS Take 1 tablet by mouth daily after breakfast.     nystatin  (MYCOSTATIN ) 100000 UNIT/ML suspension Take 5 mLs (500,000 Units total) by mouth 4 (four) times daily. Use for 10 days - swish and spit 200 mL 0   simvastatin  (ZOCOR ) 20 MG tablet Take 1 tablet (20 mg total) by mouth at bedtime. 90 tablet 3   Zinc 30 MG TABS Take 30 mg by mouth daily.     No current facility-administered medications on file prior to visit.    Past Surgical History:  Procedure Laterality Date   COLONOSCOPY  2011   w/Dr. Arvie Latus   MICROLARYNGOSCOPY W/VOCAL CORD INJECTION Right 07/19/2016   Procedure: MICROLARYNGOSCOPY WITH EXCISION OF RIGHT  VOCAL CORD POLYP;  Surgeon: Ammon Bales, MD;  Location: [facility] OR;  Service: ENT;  Laterality: Right;   MICROLARYNGOSCOPY WITH CO2 LASER AND EXCISION OF VOCAL CORD LESION Right 06/26/2022   Procedure: MICROLARYNGOSCOPY WITH VOCAL CORD [facility] OF RIGHT CORD NODULE;  Surgeon: Ammon Bales, MD;  Location: [facility] OR;  Service: ENT;  Laterality: Right;   PARTIAL KNEE ARTHROPLASTY Left 02/12/2016   Procedure: UNICOMPARTMENTAL LEFT KNEE MEDIALLY;  Surgeon: Claiborne Crew, MD;  Location: [facility] ORS;  Service: Orthopedics;  Laterality: Left;   PROSTATECTOMY     SKIN CANCER EXCISION     TONSILLECTOMY      Allergies  Allergen Reactions   Clindamycin     Dizziness (intolerance)   Penicillins Rash    Has patient had a PCN reaction causing immediate rash, facial/tongue/throat swelling, SOB or lightheadedness with hypotension: Yes Has patient had a PCN reaction causing severe rash involving mucus membranes or skin necrosis: No Has patient had a PCN reaction that required hospitalization: No Has patient had a PCN reaction occurring within the last 10 years: No If all of the above answers are "NO", then may proceed with Cephalosporin use.    BP (!) 147/57 (BP Location: Left Arm, Patient Position: Sitting, Cuff Size: Normal)   Ht 5'  9" (1.753 m)   Wt 129 lb (58.5 kg)   BMI 19.05 kg/m       No data to display              No data to display              Objective:  Physical Exam:  Gen: NAD, comfortable in exam room  MSK: Inspection: no bruises, swelling, erythema of the hip Palpation: tenderness of the greater trochanter, no tenderness at glutes, anterior hip ROM: normal to flexion, extension, IR/ER Strength: 5/5 to hip flexion, extension, abduction, adduction Gait: no limp, leg weakness, or imbalance noted Special Tests: Log roll negative, Scour negative, FABER negative   Assessment & Plan:  Patient is a 89 y.o. male here for right hip pain after a fall 4/31/2025. A greater trochanter contusion seems most likely given his pain is only with side-lying and stairs, he is not having any pain at rest, and he was able to exercise this morning without any concerns. Suspicion for a fracture of low but given his age and the mechanism of the fall we will order an XR to rule out.    Greater Trochanter Contusion - XR Hip to rule out fracture - Tylenol , heat, ice as needed for pain - Resume activity as tolerated  Elnoria Hails, MS4 [facility]

## 2024-04-05 ENCOUNTER — Ambulatory Visit: Payer: Self-pay | Admitting: Family Medicine

## 2024-04-13 DIAGNOSIS — H02831 Dermatochalasis of right upper eyelid: Secondary | ICD-10-CM | POA: Diagnosis not present

## 2024-04-13 DIAGNOSIS — H02834 Dermatochalasis of left upper eyelid: Secondary | ICD-10-CM | POA: Diagnosis not present

## 2024-04-13 DIAGNOSIS — H43811 Vitreous degeneration, right eye: Secondary | ICD-10-CM | POA: Diagnosis not present

## 2024-04-13 DIAGNOSIS — H2513 Age-related nuclear cataract, bilateral: Secondary | ICD-10-CM | POA: Diagnosis not present

## 2024-04-13 DIAGNOSIS — H0102B Squamous blepharitis left eye, upper and lower eyelids: Secondary | ICD-10-CM | POA: Diagnosis not present

## 2024-04-13 DIAGNOSIS — H0102A Squamous blepharitis right eye, upper and lower eyelids: Secondary | ICD-10-CM | POA: Diagnosis not present

## 2024-04-19 DIAGNOSIS — Z85828 Personal history of other malignant neoplasm of skin: Secondary | ICD-10-CM | POA: Diagnosis not present

## 2024-04-19 DIAGNOSIS — C44722 Squamous cell carcinoma of skin of right lower limb, including hip: Secondary | ICD-10-CM | POA: Diagnosis not present

## 2024-04-19 DIAGNOSIS — D485 Neoplasm of uncertain behavior of skin: Secondary | ICD-10-CM | POA: Diagnosis not present

## 2024-04-19 DIAGNOSIS — L570 Actinic keratosis: Secondary | ICD-10-CM | POA: Diagnosis not present

## 2024-05-31 DIAGNOSIS — C44722 Squamous cell carcinoma of skin of right lower limb, including hip: Secondary | ICD-10-CM | POA: Diagnosis not present

## 2024-06-04 ENCOUNTER — Encounter: Payer: Self-pay | Admitting: Internal Medicine

## 2024-06-04 ENCOUNTER — Ambulatory Visit (INDEPENDENT_AMBULATORY_CARE_PROVIDER_SITE_OTHER): Admitting: Internal Medicine

## 2024-06-04 ENCOUNTER — Ambulatory Visit: Payer: Self-pay

## 2024-06-04 VITALS — BP 160/72 | HR 67 | Temp 97.8°F | Ht 69.0 in | Wt 128.4 lb

## 2024-06-04 DIAGNOSIS — R42 Dizziness and giddiness: Secondary | ICD-10-CM

## 2024-06-04 DIAGNOSIS — E538 Deficiency of other specified B group vitamins: Secondary | ICD-10-CM | POA: Diagnosis not present

## 2024-06-04 DIAGNOSIS — I10 Essential (primary) hypertension: Secondary | ICD-10-CM | POA: Diagnosis not present

## 2024-06-04 DIAGNOSIS — E559 Vitamin D deficiency, unspecified: Secondary | ICD-10-CM | POA: Diagnosis not present

## 2024-06-04 DIAGNOSIS — C4492 Squamous cell carcinoma of skin, unspecified: Secondary | ICD-10-CM | POA: Diagnosis not present

## 2024-06-04 DIAGNOSIS — R739 Hyperglycemia, unspecified: Secondary | ICD-10-CM | POA: Diagnosis not present

## 2024-06-04 DIAGNOSIS — E7800 Pure hypercholesterolemia, unspecified: Secondary | ICD-10-CM | POA: Diagnosis not present

## 2024-06-04 LAB — CBC WITH DIFFERENTIAL/PLATELET
Basophils Absolute: 0.1 K/uL (ref 0.0–0.1)
Basophils Relative: 1 % (ref 0.0–3.0)
Eosinophils Absolute: 0.1 K/uL (ref 0.0–0.7)
Eosinophils Relative: 1.2 % (ref 0.0–5.0)
HCT: 40 % (ref 39.0–52.0)
Hemoglobin: 13.5 g/dL (ref 13.0–17.0)
Lymphocytes Relative: 26.2 % (ref 12.0–46.0)
Lymphs Abs: 1.3 K/uL (ref 0.7–4.0)
MCHC: 33.7 g/dL (ref 30.0–36.0)
MCV: 96.2 fl (ref 78.0–100.0)
Monocytes Absolute: 0.5 K/uL (ref 0.1–1.0)
Monocytes Relative: 9.6 % (ref 3.0–12.0)
Neutro Abs: 3.1 K/uL (ref 1.4–7.7)
Neutrophils Relative %: 62 % (ref 43.0–77.0)
Platelets: 241 K/uL (ref 150.0–400.0)
RBC: 4.16 Mil/uL — ABNORMAL LOW (ref 4.22–5.81)
RDW: 13.2 % (ref 11.5–15.5)
WBC: 5.1 K/uL (ref 4.0–10.5)

## 2024-06-04 LAB — BASIC METABOLIC PANEL WITH GFR
BUN: 24 mg/dL — ABNORMAL HIGH (ref 6–23)
CO2: 30 meq/L (ref 19–32)
Calcium: 9 mg/dL (ref 8.4–10.5)
Chloride: 103 meq/L (ref 96–112)
Creatinine, Ser: 0.84 mg/dL (ref 0.40–1.50)
GFR: 75.48 mL/min (ref >60.00)
Glucose, Bld: 99 mg/dL (ref 70–99)
Potassium: 4.4 meq/L (ref 3.5–5.1)
Sodium: 142 meq/L (ref 135–145)

## 2024-06-04 LAB — HEPATIC FUNCTION PANEL
ALT: 13 U/L (ref 0–53)
AST: 17 U/L (ref 0–37)
Albumin: 4.1 g/dL (ref 3.5–5.2)
Alkaline Phosphatase: 44 U/L (ref 39–117)
Bilirubin, Direct: 0.1 mg/dL (ref 0.0–0.3)
Total Bilirubin: 0.5 mg/dL (ref 0.2–1.2)
Total Protein: 6.8 g/dL (ref 6.0–8.3)

## 2024-06-04 LAB — URINALYSIS, ROUTINE W REFLEX MICROSCOPIC
Bilirubin Urine: NEGATIVE
Ketones, ur: NEGATIVE
Leukocytes,Ua: NEGATIVE
Nitrite: NEGATIVE
Specific Gravity, Urine: 1.02 (ref 1.000–1.030)
Total Protein, Urine: NEGATIVE
Urine Glucose: NEGATIVE
Urobilinogen, UA: 0.2 (ref 0.0–1.0)
pH: 6 (ref 5.0–8.0)

## 2024-06-04 LAB — LIPID PANEL
Cholesterol: 173 mg/dL (ref 0–200)
HDL: 56.1 mg/dL (ref >39.00)
LDL Cholesterol: 103 mg/dL — ABNORMAL HIGH (ref 0–99)
NonHDL: 116.44
Total CHOL/HDL Ratio: 3
Triglycerides: 69 mg/dL (ref 0.0–149.0)
VLDL: 13.8 mg/dL (ref 0.0–40.0)

## 2024-06-04 LAB — TSH: TSH: 3.61 u[IU]/mL (ref 0.35–5.50)

## 2024-06-04 LAB — HEMOGLOBIN A1C: Hgb A1c MFr Bld: 6.4 % (ref 4.6–6.5)

## 2024-06-04 NOTE — Assessment & Plan Note (Signed)
 Lab Results  Component Value Date   HGBA1C 6.2 04/16/2022   Stable, pt to continue current medical treatment  - for diet, wt control, for f/u lab today

## 2024-06-04 NOTE — Progress Notes (Signed)
 Patient ID: Darren Edwards, male   DOB: 1929-09-24, 89 y.o.   MRN: [id number]        Chief Complaint: follow up dizziness, htn flare, hyperglycemia       HPI:  Darren Edwards is a 89 y.o. male here with c/o acute symptoms beginning soon after multiple injections of kenalog/lidocaine /5FU per dermatology 4 days ago (5 to leg, 1 to left wrist) with hx of multiple squamous cell CA, primarily just feeling off , but also dizziness lightheaded today that was severe intermittent, and elevated SBP to 180s that today was starting to settle down to overall 150.         Wt Readings from Last 3 Encounters:  06/04/24 128 lb 6 oz (58.2 kg)  03/29/24 129 lb (58.5 kg)  02/20/24 129 lb 6 oz (58.7 kg)   BP Readings from Last 3 Encounters:  06/04/24 (!) 160/72  03/29/24 (!) 147/57  02/20/24 (!) 159/76         Past Medical History:  Diagnosis Date   Arthritis    Knee   Cancer (HCC)    HX SKIN CANCERS / HX PROSTATE CANCER   Family history of cancer of male genital organ    Family history of genetic mutation for hereditary nonpolyposis colorectal cancer (HNPCC)    Family history of lung cancer    Family history of pancreatic cancer    Family history of prostate cancer    Family history of stomach cancer    GERD (gastroesophageal reflux disease)    GERD (gastroesophageal reflux disease) 10/16/2018   Hypercholesteremia    Squamous cell skin cancer 10/16/2018   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 Debrah   MICROLARYNGOSCOPY W/VOCAL CORD INJECTION Right 07/19/2016   Procedure: MICROLARYNGOSCOPY WITH EXCISION OF RIGHT  VOCAL CORD POLYP;  Surgeon: Alm Bouche, MD;  Location: [facility] OR;  Service: ENT;  Laterality: Right;   MICROLARYNGOSCOPY WITH CO2 LASER AND EXCISION OF VOCAL CORD LESION Right 06/26/2022   Procedure: MICROLARYNGOSCOPY WITH VOCAL CORD STIPPING OF RIGHT CORD NODULE;   Surgeon: Bouche Alm, MD;  Location: [facility] OR;  Service: ENT;  Laterality: Right;   PARTIAL KNEE ARTHROPLASTY Left 02/12/2016   Procedure: UNICOMPARTMENTAL LEFT KNEE MEDIALLY;  Surgeon: Donnice Car, MD;  Location: [facility] ORS;  Service: Orthopedics;  Laterality: Left;   PROSTATECTOMY     SKIN CANCER EXCISION     TONSILLECTOMY      reports that he has been smoking cigars. He has never used smokeless tobacco. He reports that he does not currently use alcohol. He reports that he does not use drugs. family history includes Cancer in his paternal aunt and paternal uncle; Cancer (age of onset: 52) in his mother; Lung cancer in his father and sister; Lung cancer (age of onset: 16) in his paternal uncle; Pancreatic cancer in his cousin; Prostate cancer in his cousin; Stomach cancer in his maternal uncle. Allergies  Allergen Reactions   Clindamycin     Dizziness (intolerance)   Penicillins Rash    Has patient had a PCN reaction causing immediate rash, facial/tongue/throat swelling, SOB or lightheadedness with hypotension: Yes Has patient had a PCN reaction causing severe rash involving mucus membranes or skin necrosis: No Has patient had a PCN reaction that required hospitalization: No Has patient had a PCN reaction occurring within the last 10 years: No If all of the  above answers are NO, then may proceed with Cephalosporin use.   Current Outpatient Medications on File Prior to Visit  Medication Sig Dispense Refill   Ascorbic Acid (VITAMIN C) 1000 MG tablet Take 1,000 mg by mouth daily.     aspirin  81 MG chewable tablet Chew 81 mg by mouth daily.     cefdinir  (OMNICEF ) 300 MG capsule Take 1 capsule (300 mg total) by mouth 2 (two) times daily. 20 capsule 0   cholecalciferol (VITAMIN D ) 1000 units tablet Take 1,000 Units by mouth daily.     Coenzyme Q10 (CO Q 10) 100 MG CAPS Take 100 mg by mouth daily.  30 capsule 0   Multiple Vitamins-Minerals (ONE-A-DAY MENS 50+ ADVANTAGE) TABS Take 1 tablet by  mouth daily after breakfast.     simvastatin  (ZOCOR ) 20 MG tablet Take 1 tablet (20 mg total) by mouth at bedtime. 90 tablet 3   Zinc 30 MG TABS Take 30 mg by mouth daily.     nystatin  (MYCOSTATIN ) 100000 UNIT/ML suspension Take 5 mLs (500,000 Units total) by mouth 4 (four) times daily. Use for 10 days - swish and spit (Patient not taking: Reported on 06/04/2024) 200 mL 0   No current facility-administered medications on file prior to visit.        ROS:  All others reviewed and negative.  Objective        PE:  BP (!) 160/72   Pulse 67   Temp 97.8 F (36.6 C) (Temporal)   Ht 5' 9 (1.753 m)   Wt 128 lb 6 oz (58.2 kg)   SpO2 98%   BMI 18.96 kg/m                 Constitutional: Pt appears in NAD               HENT: Head: NCAT.                Right Ear: External ear normal.                 Left Ear: External ear normal.                Eyes: . Pupils are equal, round, and reactive to light. Conjunctivae and EOM are normal               Nose: without d/c or deformity               Neck: Neck supple. Gross normal ROM               Cardiovascular: Normal rate and regular rhythm.                 Pulmonary/Chest: Effort normal and breath sounds without rales or wheezing.                Abd:  Soft, NT, ND, + BS, no organomegaly               Neurological: Pt is alert. At baseline orientation, motor grossly intact               Skin: Skin is warm. No rashes, no other new lesions, LE edema - none               Psychiatric: Pt behavior is normal without agitation   Micro: none  Cardiac tracings I have personally interpreted today:  none  Pertinent Radiological findings (summarize): none   Lab Results  Component Value Date  WBC 5.4 01/17/2023   HGB 13.6 01/17/2023   HCT 40.0 01/17/2023   PLT 267.0 01/17/2023   GLUCOSE 98 01/17/2023   CHOL 162 04/16/2022   TRIG 86.0 04/16/2022   HDL 56.80 04/16/2022   LDLCALC 88 04/16/2022   ALT 14 01/17/2023   AST 20 01/17/2023   NA 141  01/17/2023   K 4.6 01/17/2023   CL 104 01/17/2023   CREATININE 0.92 01/17/2023   BUN 25 (H) 01/17/2023   CO2 32 01/17/2023   TSH 2.57 01/17/2023   PSA 0.00 (L) 01/03/2016   INR 1.04 02/02/2016   HGBA1C 6.2 04/16/2022   Assessment/Plan:  ADITHYA DIFRANCESCO is a 89 y.o. White or Caucasian [1] male with  has a past medical history of Arthritis, Cancer (HCC), Family history of cancer of male genital organ, Family history of genetic mutation for hereditary nonpolyposis colorectal cancer (HNPCC), Family history of lung cancer, Family history of pancreatic cancer, Family history of prostate cancer, Family history of stomach cancer, GERD (gastroesophageal reflux disease), GERD (gastroesophageal reflux disease) (10/16/2018), Hypercholesteremia, Squamous cell skin cancer (10/16/2018), and Vocal cord cancer (HCC) (10/16/2018).  Squamous cell skin cancer Pt to f/u with dermatology, I suggested he refrain from further injections if possible  Dizziness Not orthostatic and drinking plenty of fluids, but more likely vasovagal vs 5FU side effect related, pt now for labs r/o anemia or hyperglycemia worsening as ordered, continue hydration  Hyperglycemia Lab Results  Component Value Date   HGBA1C 6.2 04/16/2022   Stable, pt to continue current medical treatment  - for diet, wt control, for f/u lab today   Essential hypertension BP Readings from Last 3 Encounters:  06/04/24 (!) 160/72  03/29/24 (!) 147/57  02/20/24 (!) 159/76   Uncontrolled but likely reactive, pt to continue diet and wt control for now, continue to monitor over the weekend and report persistent BP's > 140/90   HLD (hyperlipidemia) Lab Results  Component Value Date   LDLCALC 88 04/16/2022   Stable, pt to continue current statin zocor  20 mg every day, and f/u lab today  Followup: Return in about 6 months (around 12/05/2024).  Lynwood Rush, MD 06/04/2024 8:36 PM [facility]

## 2024-06-04 NOTE — Patient Instructions (Signed)
 Please work on hydration as you do, and avoid climbing stairs (up or down)  Please continue all other medications as before, and refills have been done if requested.  Please have the pharmacy call with any other refills you may need.  Please continue your efforts at being more active, low cholesterol diet, and weight control.  Please keep your appointments with your specialists as you may have planned  Please go to the LAB at the blood drawing area for the tests to be done  You will be contacted by phone if any changes need to be made immediately.  Otherwise, you will receive a letter about your results with an explanation, but please check with MyChart first.  Please make an Appointment to return in 6 months, or sooner if needed

## 2024-06-04 NOTE — Assessment & Plan Note (Signed)
 Pt to f/u with dermatology, I suggested he refrain from further injections if possible

## 2024-06-04 NOTE — Assessment & Plan Note (Signed)
 Not orthostatic and drinking plenty of fluids, but more likely vasovagal vs 5FU side effect related, pt now for labs r/o anemia or hyperglycemia worsening as ordered, continue hydration

## 2024-06-04 NOTE — Assessment & Plan Note (Signed)
 Lab Results  Component Value Date   LDLCALC 88 04/16/2022   Stable, pt to continue current statin zocor  20 mg every day, and f/u lab today

## 2024-06-04 NOTE — Telephone Encounter (Signed)
 Appointment made for today 06/04/2024 at 3:20 PM with patient's PCP Dr Lynwood Rush  FYI Only or Action Required?: FYI only for provider.  Patient was last seen in primary care on 09/16/2023 by Geofm Glade PARAS, MD.  Called Nurse Triage reporting Dizziness.  Symptoms began 4 days ago after injections at dermatology office.  Interventions attempted: Rest, hydration, or home remedies.  Symptoms are: gradually worsening.  Triage Disposition: See Physician Within 24 Hours, See PCP When Office is Open (Within 3 Days)  Patient/caregiver understands and will follow disposition?: Yes                  Copied from CRM [id number]. Topic: Clinical - Red Word Triage >> Jun 04, 2024 11:25 AM [name] wrote: [facility] that prompted transfer to Nurse Triage: Wife states patient had 6 shots by Dermatologist on Monday, having extreme dizziness and high blood pressure since then 181/101 Reason for Disposition  Systolic BP >= 160 OR Diastolic >= 100  [8] MODERATE dizziness (e.g., interferes with normal activities) AND [2] has NOT been evaluated by doctor (or NP/PA) for this  (Exception: Dizziness caused by heat exposure, sudden standing, or poor fluid intake.)  Answer Assessment - Initial Assessment Questions 6 injections at dermatology on Monday (4 days ago) -- Kenalog and Lidocaine , Fluoroucil 5 Dermatology stated it could make blood pressure to go up, make him dizzy, and a drop in hemoglobin  Patient has been more dizzy than he normally is at baseline   The patient went to the [place] on Tuesday---hypertension was noted at that appointment Recent blood pressures: 183/101 167/100  153/85 during Triage 11:39 AM 178/90 with a heart rate of 71  They spoke with dermatology office and they advised for the patient to call his PCP to be seen today. Patient denies any numbness, weakness, tingling, difficulty speaking  This RN scheduled the patient to see his PCP today Patient and his wife are  advised that if anything worsens to go to the Emergency Room immediately or call 911 for an ambulance They verbalized understanding.    1. DESCRIPTION: Describe your dizziness.     very 2. LIGHTHEADED: Do you feel lightheaded? (e.g., somewhat faint, woozy, weak upon standing)     ---- 3. VERTIGO: Do you feel like either you or the room is spinning or tilting? (i.e., vertigo)     ----- 4. SEVERITY: How bad is it?  Do you feel like you are going to faint? Can you stand and walk?     ---- 5. ONSET:  When did the dizziness begin?     The past few days since injections at dermatology 6. AGGRAVATING FACTORS: Does anything make it worse? (e.g., standing, change in head position)     Standing and walking 7. HEART RATE: Can you tell me your heart rate? How many beats in 15 seconds?  (Note: Not all patients can do this.)       71 8. CAUSE: What do you think is causing the dizziness? (e.g., decreased fluids or food, diarrhea, emotional distress, heat exposure, new medicine, sudden standing, vomiting; unknown)     Injections at dermatology 9. RECURRENT SYMPTOM: Have you had dizziness before? If Yes, ask: When was the last time? What happened that time?     ----- 10. OTHER SYMPTOMS: Do you have any other symptoms? (e.g., fever, chest pain, vomiting, diarrhea, bleeding)       Elevated blood pressure  Answer Assessment - Initial Assessment Questions 1. BLOOD PRESSURE: What  is your blood pressure? Did you take at least two measurements 5 minutes apart?     153/85 during Triage  11:39 AM 178/90 with a heart rate of 71   2. ONSET: When did you take your blood pressure?     Prior to triage and also twice on the phone with this RN  3. HOW: How did you take your blood pressure? (e.g., automatic home BP monitor, visiting nurse)    Automatic bp cuff at home  Protocols used: Dizziness - Lightheadedness-A-AH, Blood Pressure - High-A-AH

## 2024-06-04 NOTE — Assessment & Plan Note (Signed)
 BP Readings from Last 3 Encounters:  06/04/24 (!) 160/72  03/29/24 (!) 147/57  02/20/24 (!) 159/76   Uncontrolled but likely reactive, pt to continue diet and wt control for now, continue to monitor over the weekend and report persistent BP's > 140/90

## 2024-06-07 ENCOUNTER — Other Ambulatory Visit: Payer: Self-pay | Admitting: Internal Medicine

## 2024-06-07 ENCOUNTER — Ambulatory Visit: Payer: Self-pay | Admitting: Internal Medicine

## 2024-06-07 DIAGNOSIS — R3129 Other microscopic hematuria: Secondary | ICD-10-CM

## 2024-06-07 LAB — VITAMIN B12: Vitamin B-12: 511 pg/mL (ref 211–911)

## 2024-06-07 LAB — VITAMIN D 25 HYDROXY (VIT D DEFICIENCY, FRACTURES): VITD: 46.21 ng/mL (ref 30.00–100.00)

## 2024-06-08 ENCOUNTER — Encounter: Payer: Self-pay | Admitting: Internal Medicine

## 2024-06-08 DIAGNOSIS — C61 Malignant neoplasm of prostate: Secondary | ICD-10-CM

## 2024-06-09 ENCOUNTER — Ambulatory Visit: Payer: Medicare Other

## 2024-06-09 VITALS — Ht 68.0 in | Wt 128.0 lb

## 2024-06-09 DIAGNOSIS — Z0000 Encounter for general adult medical examination without abnormal findings: Secondary | ICD-10-CM

## 2024-06-09 NOTE — Progress Notes (Signed)
 Subjective:   Darren Edwards is a 89 y.o. who presents for a Medicare Wellness preventive visit.  As a reminder, Annual Wellness Visits don't include a physical exam, and some assessments may be limited, especially if this visit is performed virtually. We may recommend an in-person follow-up visit with your provider if needed.  Visit Complete: Virtual I connected with  Ryan KATHEE Perry on 06/09/24 by a audio enabled telemedicine application and verified that I am speaking with the correct person using two identifiers.  Patient Location: Home  Provider Location: Office/Clinic  I discussed the limitations of evaluation and management by telemedicine. The patient expressed understanding and agreed to proceed.  Vital Signs: Because this visit was a virtual/telehealth visit, some criteria may be missing or patient reported. Any vitals not documented were not able to be obtained and vitals that have been documented are patient reported.  VideoDeclined- This patient declined Librarian, academic. Therefore the visit was completed with audio only.  Persons Participating in Visit: Patient assisted by Daughter, Mliss Modest.  AWV Questionnaire: No: Patient Medicare AWV questionnaire was not completed prior to this visit.  Cardiac Risk Factors include: advanced age (>58men, >20 women);dyslipidemia;hypertension;male gender     Objective:    Today's Vitals   06/09/24 0851  Weight: 128 lb (58.1 kg)  Height: 5' 8 (1.727 m)   Body mass index is 19.46 kg/m.     06/09/2024    8:53 AM 08/15/2023    1:58 PM 06/09/2023    9:03 AM 02/07/2023    2:04 PM 09/13/2022    2:12 PM 07/30/2022    3:06 PM 07/30/2022    2:11 PM  Advanced Directives  Does Patient Have a Medical Advance Directive? Yes Yes Yes Yes Yes Yes Yes  Type of Estate agent of [place];Living will  Healthcare Power of [place];Living will Healthcare Power of [place];Living will Living  will Healthcare Power of [place];Living will Healthcare Power of [place];Living will  Does patient want to make changes to medical advance directive?    No - Patient declined  No - Patient declined No - Guardian declined  Copy of Healthcare Power of Attorney in Chart? No - copy requested  No - copy requested No - copy requested Yes - validated most recent copy scanned in chart (See row information) No - copy requested No - copy requested    Current Medications (verified) Outpatient Encounter Medications as of 06/09/2024  Medication Sig   Ascorbic Acid (VITAMIN C) 1000 MG tablet Take 1,000 mg by mouth daily.   aspirin  81 MG chewable tablet Chew 81 mg by mouth daily.   cefdinir  (OMNICEF ) 300 MG capsule Take 1 capsule (300 mg total) by mouth 2 (two) times daily.   cholecalciferol (VITAMIN D ) 1000 units tablet Take 1,000 Units by mouth daily.   Coenzyme Q10 (CO Q 10) 100 MG CAPS Take 100 mg by mouth daily.    Multiple Vitamins-Minerals (ONE-A-DAY MENS 50+ ADVANTAGE) TABS Take 1 tablet by mouth daily after breakfast.   simvastatin  (ZOCOR ) 20 MG tablet Take 1 tablet (20 mg total) by mouth at bedtime.   Zinc 30 MG TABS Take 30 mg by mouth daily.   No facility-administered encounter medications on file as of 06/09/2024.    Allergies (verified) Clindamycin and Penicillins   History: Past Medical History:  Diagnosis Date   Arthritis    Knee   Cancer (HCC)    HX SKIN CANCERS / HX PROSTATE CANCER   Family history  of cancer of male genital organ    Family history of genetic mutation for hereditary nonpolyposis colorectal cancer (HNPCC)    Family history of lung cancer    Family history of pancreatic cancer    Family history of prostate cancer    Family history of stomach cancer    GERD (gastroesophageal reflux disease)    GERD (gastroesophageal reflux disease) 10/16/2018   Hypercholesteremia    Squamous cell skin cancer 10/16/2018   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 Debrah   MICROLARYNGOSCOPY W/VOCAL CORD INJECTION Right 07/19/2016   Procedure: MICROLARYNGOSCOPY WITH EXCISION OF RIGHT  VOCAL CORD POLYP;  Surgeon: Alm Bouche, MD;  Location: [facility] OR;  Service: ENT;  Laterality: Right;   MICROLARYNGOSCOPY WITH CO2 LASER AND EXCISION OF VOCAL CORD LESION Right 06/26/2022   Procedure: MICROLARYNGOSCOPY WITH VOCAL CORD [facility] OF RIGHT CORD NODULE;  Surgeon: Bouche Alm, MD;  Location: [facility] OR;  Service: ENT;  Laterality: Right;   PARTIAL KNEE ARTHROPLASTY Left 02/12/2016   Procedure: UNICOMPARTMENTAL LEFT KNEE MEDIALLY;  Surgeon: Donnice Car, MD;  Location: [facility] ORS;  Service: Orthopedics;  Laterality: Left;   PROSTATECTOMY     SKIN CANCER EXCISION     TONSILLECTOMY     Family History  Problem Relation Age of Onset   Cancer Mother 50       male cancer   Lung cancer Father        smoker   Lung cancer Sister        smoker, diagnosed mid-60s   Stomach cancer Maternal Uncle        diagnosed early 45s   Cancer Paternal Aunt        liver or pancreatic cancer, diagnosed mid 44s   Lung cancer Paternal Uncle 64   Cancer Paternal Uncle        unknown type, diagnosed late 72s   Prostate cancer Cousin        diagnosed 40s   Pancreatic cancer Cousin        diagnosed 57s   Colon cancer Neg Hx    Colon polyps Neg Hx    Diabetes Neg Hx    Esophageal cancer Neg Hx    Social History   Socioeconomic History   Marital status: Married    Spouse name: Not on file   Number of children: 5   Years of education: Not on file   Highest education level: Not on file  Occupational History   Occupation: Retired  Tobacco Use   Smoking status: Former    Types: Cigars   Smokeless tobacco: Never   Tobacco comments:    Has not had a cigar in >30 yrs  Vaping Use   Vaping status: Never Used  Substance and Sexual Activity   Alcohol use: Not Currently     Alcohol/week: 3.0 standard drinks of alcohol    Types: 1 Glasses of wine, 1 Cans of beer, 1 Shots of liquor per week    Comment: social    Drug use: No   Sexual activity: Not Currently  Other Topics Concern   Not on file  Social History Narrative   Married   Social Drivers of Health   Financial Resource Strain: Low Risk  (06/09/2024)   Overall Financial Resource Strain (CARDIA)    Difficulty of Paying Living Expenses: Not hard at all  Food Insecurity: No  Food Insecurity (06/09/2024)   Hunger Vital Sign    Worried About Running Out of Food in the Last Year: Never true    Ran Out of Food in the Last Year: Never true  Transportation Needs: No Transportation Needs (06/09/2024)   PRAPARE - Administrator, Civil Service (Medical): No    Lack of Transportation (Non-Medical): No  Physical Activity: Insufficiently Active (06/09/2024)   Exercise Vital Sign    Days of Exercise per Week: 7 days    Minutes of Exercise per Session: 20 min  Stress: No Stress Concern Present (06/09/2024)   [employer] of Occupational Health - Occupational Stress Questionnaire    Feeling of Stress: Not at all  Social Connections: Moderately Integrated (06/09/2024)   Social Connection and Isolation Panel    Frequency of Communication with Friends and Family: More than three times a week    Frequency of Social Gatherings with Friends and Family: More than three times a week    Attends Religious Services: Never    Database administrator or Organizations: Yes    Attends Engineer, structural: More than 4 times per year    Marital Status: Married    Tobacco Counseling Counseling given: Yes Tobacco comments: Has not had a cigar in >30 yrs    Clinical Intake:  Pre-visit preparation completed: Yes  Pain : No/denies pain     BMI - recorded: 19.46 Nutritional Status: BMI of 19-24  Normal Nutritional Risks: None Diabetes: No  Lab Results  Component Value Date   HGBA1C 6.4  06/04/2024   HGBA1C 6.2 04/16/2022   HGBA1C 6.0 10/16/2018     How often do you need to have someone help you when you read instructions, pamphlets, or other written materials from your doctor or pharmacy?: 1 - Never  Interpreter Needed?: No  Information entered by :: Verdie Saba, CMA   Activities of Daily Living     06/09/2024    8:59 AM  In your present state of health, do you have any difficulty performing the following activities:  Hearing? 0  Comment wears hearing aids  Vision? 0  Difficulty concentrating or making decisions? 0  Walking or climbing stairs? 0  Dressing or bathing? 0  Doing errands, shopping? 0  Preparing Food and eating ? N  Using the Toilet? N  In the past six months, have you accidently leaked urine? Y  Comment wears a depend  Do you have problems with loss of bowel control? N  Managing your Medications? N  Managing your Finances? N  Housekeeping or managing your Housekeeping? N    Patient Care Team: Norleen Lynwood ORN, MD as PCP - General (Internal Medicine) Malmfelt, Delon CROME, RN as Oncology Nurse Navigator Izell Domino, MD as Consulting Physician (Radiation Oncology) Mable Lenis, MD (Inactive) as Consulting Physician (Otolaryngology) [facility] as Consulting Physician (Ophthalmology)  I have updated your Care Teams any recent Medical Services you may have received from other providers in the past year.     Assessment:   This is a routine wellness examination for Laterrian.  Hearing/Vision screen Hearing Screening - Comments:: Wears hearing aids Vision Screening - Comments:: Wears rx glasses - up to date with routine eye exams with Dr Tobie and Dr Medford Gaudy   Goals Addressed               This Visit's Progress     Patient Stated (pt-stated)        Patient  stated he plans to stay active       Depression Screen     06/09/2024    8:59 AM 06/09/2023    9:06 AM 04/16/2022    3:44 PM 04/16/2022    3:26 PM  10/16/2018    3:39 PM 09/01/2013   12:00 PM  PHQ 2/9 Scores  PHQ - 2 Score 0 0 0 0 0 0  PHQ- 9 Score 1 0  0      Fall Risk     06/09/2024    8:59 AM 06/09/2023    9:05 AM 01/17/2023    1:06 PM 04/16/2022    3:43 PM 04/16/2022    3:26 PM  Fall Risk   Falls in the past year? 0 0 0 0 0  Number falls in past yr: 0 0 0 0 0  Injury with Fall? 0 0 0 0 0  Risk for fall due to : No Fall Risks No Fall Risks No Fall Risks    Follow up Falls evaluation completed;Falls prevention discussed Falls prevention discussed Falls evaluation completed      MEDICARE RISK AT HOME:  Medicare Risk at Home Any stairs in or around the home?: Yes If so, are there any without handrails?: No Home free of loose throw rugs in walkways, pet beds, electrical cords, etc?: Yes Adequate lighting in your home to reduce risk of falls?: Yes Life alert?: No Use of a cane, walker or w/c?: No Grab bars in the bathroom?: Yes Shower chair or bench in shower?: No Elevated toilet seat or a handicapped toilet?: No  TIMED UP AND GO:  Was the test performed?  No  Cognitive Function: 6CIT completed        06/09/2024    9:11 AM 06/09/2023    9:09 AM  6CIT Screen  What Year? 0 points 0 points  What month? 0 points 0 points  What time? 0 points 0 points  Count back from 20 0 points 0 points  Months in reverse 0 points 0 points  Repeat phrase 0 points 0 points  Total Score 0 points 0 points    Immunizations Immunization History  Administered Date(s) Administered   Fluad Quad(high Dose 65+) 07/24/2022   Influenza Split 08/09/2013   Influenza Whole 08/03/2008, 08/04/2009, 08/07/2010, 06/28/2012   Influenza, High Dose Seasonal PF 07/28/2013, 06/28/2016, 07/23/2016, 07/28/2017, 08/21/2018, 07/27/2019   Influenza,inj,Quad PF,6+ Mos 08/23/2015   Influenza,inj,Quad PF,6-35 Mos 08/11/2014   Influenza-Unspecified 08/28/2000, 09/21/2004, 09/27/2005, 08/28/2006, 11/28/2006, 07/29/2007, 09/03/2008, 08/05/2009, 06/28/2010,  06/29/2011, 06/28/2012, 07/27/2019, 07/19/2022, 08/19/2023   Moderna Covid-19 Fall Seasonal Vaccine 14yrs & older 07/24/2022   PFIZER Comirnaty(Gray Top)Covid-19 Tri-Sucrose Vaccine 01/29/2021   PFIZER(Purple Top)SARS-COV-2 Vaccination 11/16/2019, 12/07/2019, 07/22/2020, 01/29/2021, 07/19/2021   Pfizer Covid-19 Vaccine Bivalent Booster 62yrs & up 07/19/2021   Pfizer(Comirnaty)Fall Seasonal Vaccine 12 years and older 07/19/2022   Pneumococcal Conjugate-13 02/24/2015   Pneumococcal Polysaccharide-23 10/16/2018   Pneumococcal-Unspecified 09/27/1997   Tdap 09/27/2009   Unspecified SARS-COV-2 Vaccination 08/19/2023   Zoster Recombinant(Shingrix) 02/25/2017, 05/28/2017   Zoster, Live 06/28/2010    Screening Tests Health Maintenance  Topic Date Due   DTaP/Tdap/Td (2 - Td or Tdap) 09/28/2019   COVID-19 Vaccine (9 - Pfizer risk 2024-25 season) 02/17/2024   INFLUENZA VACCINE  05/28/2024   Medicare Annual Wellness (AWV)  06/09/2025   Pneumococcal Vaccine: 50+ Years  Completed   Zoster Vaccines- Shingrix  Completed   Hepatitis B Vaccines  Aged Out   HPV VACCINES  Aged Out   Meningococcal B  Vaccine  Aged Out    Health Maintenance  Health Maintenance Due  Topic Date Due   DTaP/Tdap/Td (2 - Td or Tdap) 09/28/2019   COVID-19 Vaccine (9 - Pfizer risk 2024-25 season) 02/17/2024   INFLUENZA VACCINE  05/28/2024   Health Maintenance Items Addressed: 06/09/2024   Additional Screening:  Vision Screening: Recommended annual ophthalmology exams for early detection of glaucoma and other disorders of the eye. Would you like a referral to an eye doctor? No  Patient is currently seeing Dr Tobie.  Dental Screening: Recommended annual dental exams for proper oral hygiene  Community Resource Referral / Chronic Care Management: CRR required this visit?  No   CCM required this visit?  No   Plan:    I have personally reviewed and noted the following in the patient's chart:   Medical and social  history Use of alcohol, tobacco or illicit drugs  Current medications and supplements including opioid prescriptions. Patient is not currently taking opioid prescriptions. Functional ability and status Nutritional status Physical activity Advanced directives List of other physicians Hospitalizations, surgeries, and ER visits in previous 12 months Vitals Screenings to include cognitive, depression, and falls Referrals and appointments  In addition, I have reviewed and discussed with patient certain preventive protocols, quality metrics, and best practice recommendations. A written personalized care plan for preventive services as well as general preventive health recommendations were provided to patient.   Verdie CHRISTELLA Saba, CMA   06/09/2024   After Visit Summary: (MyChart) Due to this being a telephonic visit, the after visit summary with patients personalized plan was offered to patient via MyChart   Notes: Scheduled a 1-yr Physical with PCP for 07/13/2024.

## 2024-06-09 NOTE — Patient Instructions (Signed)
 Mr. Dirr , Thank you for taking time out of your busy schedule to complete your Annual Wellness Visit with me. I enjoyed our conversation and look forward to speaking with you again next year. I, as well as your care team,  appreciate your ongoing commitment to your health goals. Please review the following plan we discussed and let me know if I can assist you in the future. Your Game plan/ To Do List    Referrals: If you haven't heard from the office you've been referred to, please reach out to them at the phone provided.   Follow up Visits: We will see or speak with you next year for your Next Medicare AWV with our clinical staff Have you seen your provider in the last 6 months (3 months if uncontrolled diabetes)? Yes  Clinician Recommendations:  Aim for 30 minutes of exercise or brisk walking, 6-8 glasses of water, and 5 servings of fruits and vegetables each day. Educated and advised on getting a Tdap vaccine at [facility].      This is a list of the screenings recommended for you:  Health Maintenance  Topic Date Due   DTaP/Tdap/Td vaccine (2 - Td or Tdap) 09/28/2019   COVID-19 Vaccine (9 - Pfizer risk 2024-25 season) 02/17/2024   Flu Shot  05/28/2024   Medicare Annual Wellness Visit  06/09/2025   Pneumococcal Vaccine for age over 14  Completed   Zoster (Shingles) Vaccine  Completed   Hepatitis B Vaccine  Aged Out   HPV Vaccine  Aged Out   Meningitis B Vaccine  Aged Out    Advanced directives: (Copy Requested) Please bring a copy of your health care power of attorney and living will to the office to be added to your chart at your convenience. You can mail to [facility] [address] [place], [place] [address] or email to [email or web address] Advance Care Planning is important because it:  [x]  Makes sure you receive the medical care that is consistent with your values, goals, and preferences  [x]  It provides guidance to your family and loved ones and  reduces their decisional burden about whether or not they are making the right decisions based on your wishes.  Follow the link provided in your after visit summary or read over the paperwork we have mailed to you to help you started getting your Advance Directives in place. If you need assistance in completing these, please reach out to us  so that we can help you!

## 2024-06-10 DIAGNOSIS — Z923 Personal history of irradiation: Secondary | ICD-10-CM | POA: Diagnosis not present

## 2024-06-10 DIAGNOSIS — C320 Malignant neoplasm of glottis: Secondary | ICD-10-CM | POA: Diagnosis not present

## 2024-06-12 ENCOUNTER — Encounter: Payer: Self-pay | Admitting: Internal Medicine

## 2024-06-14 ENCOUNTER — Other Ambulatory Visit (INDEPENDENT_AMBULATORY_CARE_PROVIDER_SITE_OTHER)

## 2024-06-14 ENCOUNTER — Ambulatory Visit: Payer: Self-pay

## 2024-06-14 ENCOUNTER — Other Ambulatory Visit: Payer: Self-pay | Admitting: Internal Medicine

## 2024-06-14 DIAGNOSIS — R310 Gross hematuria: Secondary | ICD-10-CM

## 2024-06-14 NOTE — Addendum Note (Signed)
 Addended by: CLAUDENE DIXON B on: 06/14/2024 04:01 PM   Modules accepted: Orders

## 2024-06-14 NOTE — Telephone Encounter (Signed)
 Ok for UA and Cx,  Ideally, he should also have ROV to further assess this, thanks

## 2024-06-14 NOTE — Addendum Note (Signed)
 Addended by: CLAUDENE DIXON B on: 06/14/2024 04:00 PM   Modules accepted: Orders

## 2024-06-14 NOTE — Telephone Encounter (Signed)
 FYI Only or Action Required?: Action required by provider: request for lab order.  Patient was last seen in primary care on 06/04/2024 by Norleen Lynwood ORN, MD.  Called Nurse Triage reporting Hematuria.  Symptoms are: gradually improving.  Triage Disposition: See PCP Within 2 Weeks  Patient/caregiver understands and will follow disposition?: No, wishes to speak with PCP        Copied from CRM #[id number]. Topic: Clinical - Red Word Triage >> Jun 14, 2024  1:46 PM Mercedes MATSU wrote: Red Word that prompted transfer to Nurse Triage: Patients wife called in requesting to know if its okay to drop off the patients urine sample. The patient had a bad reaction to his chemo medication, and it was causing issues with his urine.        Reason for Disposition  All other urine symptoms  Answer Assessment - Initial Assessment Questions Patient reports he has not heard back from urology about an appointment and wanted to have his urine retested. CAL called and requested the CRM be sent to the office so an order could be placed. Patient advised of this and will wait to hear back from the office.      1. SYMPTOM: What's the main symptom you're concerned about? (e.g., frequency, incontinence)     Trace blood in urine last time it was tested 2. ONSET: When did the blood in urine start?     Found in recent urine test  3. PAIN: Is there any pain? If Yes, ask: How bad is it? (Scale: 1-10; mild, moderate, severe)     No 4. CAUSE: What do you think is causing the symptoms?     Unsure 5. OTHER SYMPTOMS: Do you have any other symptoms? (e.g., blood in urine, fever, flank pain, pain with urination)     No  Protocols used: Urinary Symptoms-A-AH

## 2024-06-15 ENCOUNTER — Ambulatory Visit: Payer: Self-pay | Admitting: Internal Medicine

## 2024-06-15 LAB — URINALYSIS, ROUTINE W REFLEX MICROSCOPIC
Bilirubin Urine: NEGATIVE
Hgb urine dipstick: NEGATIVE
Leukocytes,Ua: NEGATIVE
Nitrite: NEGATIVE
Specific Gravity, Urine: 1.02 (ref 1.000–1.030)
Total Protein, Urine: NEGATIVE
Urine Glucose: NEGATIVE
Urobilinogen, UA: 0.2 (ref 0.0–1.0)
pH: 6 (ref 5.0–8.0)

## 2024-06-15 LAB — URINE CULTURE: Result:: NO GROWTH

## 2024-06-22 DIAGNOSIS — R3121 Asymptomatic microscopic hematuria: Secondary | ICD-10-CM | POA: Diagnosis not present

## 2024-07-09 DIAGNOSIS — R3121 Asymptomatic microscopic hematuria: Secondary | ICD-10-CM | POA: Diagnosis not present

## 2024-07-13 ENCOUNTER — Encounter: Payer: Self-pay | Admitting: Internal Medicine

## 2024-07-13 ENCOUNTER — Ambulatory Visit: Admitting: Internal Medicine

## 2024-07-13 ENCOUNTER — Ambulatory Visit: Payer: Self-pay | Admitting: Internal Medicine

## 2024-07-13 VITALS — BP 154/76 | HR 76 | Temp 97.6°F | Ht 68.0 in | Wt 131.2 lb

## 2024-07-13 DIAGNOSIS — E7800 Pure hypercholesterolemia, unspecified: Secondary | ICD-10-CM

## 2024-07-13 DIAGNOSIS — R3129 Other microscopic hematuria: Secondary | ICD-10-CM | POA: Diagnosis not present

## 2024-07-13 DIAGNOSIS — Z23 Encounter for immunization: Secondary | ICD-10-CM

## 2024-07-13 DIAGNOSIS — I10 Essential (primary) hypertension: Secondary | ICD-10-CM | POA: Diagnosis not present

## 2024-07-13 DIAGNOSIS — R739 Hyperglycemia, unspecified: Secondary | ICD-10-CM

## 2024-07-13 LAB — URINALYSIS, ROUTINE W REFLEX MICROSCOPIC
Bilirubin Urine: NEGATIVE
Hgb urine dipstick: NEGATIVE
Ketones, ur: NEGATIVE
Leukocytes,Ua: NEGATIVE
Nitrite: NEGATIVE
Specific Gravity, Urine: 1.01 (ref 1.000–1.030)
Total Protein, Urine: NEGATIVE
Urine Glucose: NEGATIVE
Urobilinogen, UA: 0.2 (ref 0.0–1.0)
pH: 6 (ref 5.0–8.0)

## 2024-07-13 NOTE — Assessment & Plan Note (Signed)
 Lab Results  Component Value Date   HGBA1C 6.4 06/04/2024   Stable, pt to continue current medical treatment  - diet, wt control

## 2024-07-13 NOTE — Progress Notes (Signed)
 Patient ID: Darren Edwards, male   DOB: 04-13-1929, 89 y.o.   MRN: [id number]         Chief Complaint:: yearly exam, microhematuria, htn       HPI:  Darren Edwards is a 89 y.o. male here with daughter, hoping I can tell him that he does not need to go through with the now planned cystoscopy per urology for oct 8 for microhematuria   Did also have a CT per urology last Friday, but not sure of the results yet, and results not available in our system.   Denies urinary symptoms such as dysuria, frequency, urgency, flank pain, hematuria or n/v, fever, chills.  Pt denies chest pain, increased sob or doe, wheezing, orthopnea, PND, increased LE swelling, palpitations, dizziness or syncope.   Pt denies polydipsia, polyuria, or new focal neuro s/s. Pt states BP controlled at home, so is not concerned with BP today.  Pt for tdeap at pharmacy o/w up to date.  For flu shot today  Wt Readings from Last 3 Encounters:  07/13/24 131 lb 3.2 oz (59.5 kg)  06/09/24 128 lb (58.1 kg)  06/04/24 128 lb 6 oz (58.2 kg)   BP Readings from Last 3 Encounters:  07/13/24 (!) 154/76  06/04/24 (!) 160/72  03/29/24 (!) 147/57   Immunization History  Administered Date(s) Administered   Fluad Quad(high Dose 65+) 07/24/2022   INFLUENZA, HIGH DOSE SEASONAL PF 07/28/2013, 06/28/2016, 07/23/2016, 07/28/2017, 08/21/2018, 07/27/2019, 07/13/2024   Influenza Split 08/09/2013   Influenza Whole 08/03/2008, 08/04/2009, 08/07/2010, 06/28/2012   Influenza,inj,Quad PF,6+ Mos 08/23/2015   Influenza,inj,Quad PF,6-35 Mos 08/11/2014   Influenza-Unspecified 08/28/2000, 09/21/2004, 09/27/2005, 08/28/2006, 11/28/2006, 07/29/2007, 09/03/2008, 08/05/2009, 06/28/2010, 06/29/2011, 06/28/2012, 07/27/2019, 07/19/2022, 08/19/2023   Moderna Covid-19 Fall Seasonal Vaccine 59yrs & older 07/24/2022   PFIZER Comirnaty(Gray Top)Covid-19 Tri-Sucrose Vaccine 01/29/2021   PFIZER(Purple Top)SARS-COV-2 Vaccination 11/16/2019, 12/07/2019, 07/22/2020, 01/29/2021,  07/19/2021   Pfizer Covid-19 Vaccine Bivalent Booster 71yrs & up 07/19/2021   Pfizer(Comirnaty)Fall Seasonal Vaccine 12 years and older 07/19/2022   Pneumococcal Conjugate-13 02/24/2015   Pneumococcal Polysaccharide-23 10/16/2018   Pneumococcal-Unspecified 09/27/1997   Tdap 09/27/2009   Unspecified SARS-COV-2 Vaccination 08/19/2023   Zoster Recombinant(Shingrix) 02/25/2017, 05/28/2017   Zoster, Live 06/28/2010   Health Maintenance Due  Topic Date Due   DTaP/Tdap/Td (2 - Td or Tdap) 09/28/2019      Past Medical History:  Diagnosis Date   Allergy 1950   Penicillin   Arthritis    Knee   Cancer (HCC)    HX SKIN CANCERS / HX PROSTATE CANCER   Family history of cancer of male genital organ    Family history of genetic mutation for hereditary nonpolyposis colorectal cancer (HNPCC)    Family history of lung cancer    Family history of pancreatic cancer    Family history of prostate cancer    Family history of stomach cancer    GERD (gastroesophageal reflux disease)    GERD (gastroesophageal reflux disease) 10/16/2018   Discontinued   Hypercholesteremia    Prostate cancer (HCC) Nov 2006   Prostectomy   Squamous cell skin cancer 10/16/2018   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 Debrah   JOINT REPLACEMENT  2016   Knee   MICROLARYNGOSCOPY W/VOCAL CORD INJECTION Right 07/19/2016   Procedure: MICROLARYNGOSCOPY WITH EXCISION OF RIGHT  VOCAL CORD POLYP;  Surgeon: Alm Bouche, MD;  Location:  [facility] OR;  Service: ENT;  Laterality: Right;   MICROLARYNGOSCOPY WITH CO2 LASER AND EXCISION OF VOCAL CORD LESION Right 06/26/2022   Procedure: MICROLARYNGOSCOPY WITH VOCAL CORD STIPPING OF RIGHT CORD NODULE;  Surgeon: Mable Lenis, MD;  [facility];  Service: ENT;  Laterality: Right;   PARTIAL KNEE ARTHROPLASTY Left 02/12/2016   Procedure: UNICOMPARTMENTAL LEFT  KNEE MEDIALLY;  Surgeon: Donnice Car, MD;  Location: [facility] ORS;  Service: Orthopedics;  Laterality: Left;   PROSTATECTOMY     SKIN CANCER EXCISION     TONSILLECTOMY      reports that he has quit smoking. His smoking use included cigars. He has never used smokeless tobacco. He reports that he does not currently use alcohol after a past usage of about 3.0 standard drinks of alcohol per week. He reports that he does not use drugs. family history includes Cancer in his father, maternal uncle, mother, paternal aunt, paternal aunt, paternal aunt, paternal uncle, paternal uncle, paternal uncle, and sister; Early death in his mother and paternal uncle; Hearing loss in his brother and father; Heart disease in his brother and father; Lung cancer in his father and sister; Lung cancer (age of onset: 46) in his paternal uncle; Pancreatic cancer in his cousin; Prostate cancer in his cousin; Stomach cancer in his maternal uncle; Stroke in his paternal grandfather; Varicose Veins in his mother. Allergies  Allergen Reactions   Clindamycin     Dizziness (intolerance)   Penicillins Rash    Has patient had a PCN reaction causing immediate rash, facial/tongue/throat swelling, SOB or lightheadedness with hypotension: Yes Has patient had a PCN reaction causing severe rash involving mucus membranes or skin necrosis: No Has patient had a PCN reaction that required hospitalization: No Has patient had a PCN reaction occurring within the last 10 years: No If all of the above answers are NO, then may proceed with Cephalosporin use.   Current Outpatient Medications on File Prior to Visit  Medication Sig Dispense Refill   Ascorbic Acid (VITAMIN C) 1000 MG tablet Take 1,000 mg by mouth daily.     aspirin  81 MG chewable tablet Chew 81 mg by mouth daily.     cefdinir  (OMNICEF ) 300 MG capsule Take 1 capsule (300 mg total) by mouth 2 (two) times daily. 20 capsule 0   cholecalciferol (VITAMIN D ) 1000 units tablet Take 1,000 Units  by mouth daily.     Coenzyme Q10 (CO Q 10) 100 MG CAPS Take 100 mg by mouth daily.  30 capsule 0   Multiple Vitamins-Minerals (ONE-A-DAY MENS 50+ ADVANTAGE) TABS Take 1 tablet by mouth daily after breakfast.     simvastatin  (ZOCOR ) 20 MG tablet Take 1 tablet (20 mg total) by mouth at bedtime. 90 tablet 3   Zinc 30 MG TABS Take 30 mg by mouth daily.     No current facility-administered medications on file prior to visit.        ROS:  All others reviewed and negative.  Objective        PE:  BP (!) 154/76   Pulse 76   Temp 97.6 F (36.4 C)   Ht 5' 8 (1.727 m)   Wt 131 lb 3.2 oz (59.5 kg)   SpO2 99%   BMI 19.95 kg/m                 Constitutional: Pt appears in NAD               HENT: Head: NCAT.  Right Ear: External ear normal.                 Left Ear: External ear normal.                Eyes: . Pupils are equal, round, and reactive to light. Conjunctivae and EOM are normal               Nose: without d/c or deformity               Neck: Neck supple. Gross normal ROM               Cardiovascular: Normal rate and regular rhythm.                 Pulmonary/Chest: Effort normal and breath sounds without rales or wheezing.                Abd:  Soft, NT, ND, + BS, no organomegaly               Neurological: Pt is alert. At baseline orientation, motor grossly intact               Skin: Skin is warm. No rashes, no other new lesions, LE edema - none               Psychiatric: Pt behavior is normal without agitation   Micro: none  Cardiac tracings I have personally interpreted today:  none  Pertinent Radiological findings (summarize): none   Lab Results  Component Value Date   WBC 5.1 06/04/2024   HGB 13.5 06/04/2024   HCT 40.0 06/04/2024   PLT 241.0 06/04/2024   GLUCOSE 99 06/04/2024   CHOL 173 06/04/2024   TRIG 69.0 06/04/2024   HDL 56.10 06/04/2024   LDLCALC 103 (H) 06/04/2024   ALT 13 06/04/2024   AST 17 06/04/2024   NA 142 06/04/2024   K 4.4 06/04/2024    CL 103 06/04/2024   CREATININE 0.84 06/04/2024   BUN 24 (H) 06/04/2024   CO2 30 06/04/2024   TSH 3.61 06/04/2024   PSA 0.00 (L) 01/03/2016   INR 1.04 02/02/2016   HGBA1C 6.4 06/04/2024   Assessment/Plan:  SHARAD VANEATON is a 89 y.o. White or Caucasian [1] male with  has a past medical history of Allergy (1950), Arthritis, Cancer (HCC), Family history of cancer of male genital organ, Family history of genetic mutation for hereditary nonpolyposis colorectal cancer (HNPCC), Family history of lung cancer, Family history of pancreatic cancer, Family history of prostate cancer, Family history of stomach cancer, GERD (gastroesophageal reflux disease), GERD (gastroesophageal reflux disease) (10/16/2018), Hypercholesteremia, Prostate cancer [facility]) (Nov 2006), Squamous cell skin cancer (10/16/2018), and Vocal cord cancer (HCC) (10/16/2018).  Microhematuria Pt reassured he should continue on with the plan for cysto on oct 8, f/u CT per urology results as planned, and UA today  Hyperglycemia Lab Results  Component Value Date   HGBA1C 6.4 06/04/2024   Stable, pt to continue current medical treatment  - diet, wt control   HYPERCHOLESTEROLEMIA Lab Results  Component Value Date   LDLCALC 103 (H) 06/04/2024   uncontrolled, pt to continue current statin zocor  20 mg every day, declines other change for now   Essential hypertension BP Readings from Last 3 Encounters:  07/13/24 (!) 154/76  06/04/24 (!) 160/72  03/29/24 (!) 147/57   Uncontrolled here but pt states controlled at home, pt to continue medical treatment  - diet, wt control  Followup: Return  in about 6 months (around 01/10/2025).  Lynwood Rush, MD 07/13/2024 8:33 PM [facility]

## 2024-07-13 NOTE — Assessment & Plan Note (Signed)
 Lab Results  Component Value Date   LDLCALC 103 (H) 06/04/2024   uncontrolled, pt to continue current statin zocor  20 mg every day, declines other change for now

## 2024-07-13 NOTE — Patient Instructions (Addendum)
 You had the flu shot today  Please continue all other medications as before, and refills have been done if requested.  Please have the pharmacy call with any other refills you may need.  Please continue your efforts at being more active, low cholesterol diet, and weight control.  You are otherwise up to date with prevention measures today.  Please keep your appointments with your specialists as you may have planned - urology and the CT results  Please go to the LAB at the blood drawing area for the tests to be done - just the urine test today  You will be contacted by phone if any changes need to be made immediately.  Otherwise, you will receive a letter about your results with an explanation, but please check with MyChart first.  Please make an Appointment to return in 6 months, or sooner if needed

## 2024-07-13 NOTE — Assessment & Plan Note (Signed)
 Pt reassured he should continue on with the plan for cysto on oct 8, f/u CT per urology results as planned, and UA today

## 2024-07-13 NOTE — Assessment & Plan Note (Signed)
 BP Readings from Last 3 Encounters:  07/13/24 (!) 154/76  06/04/24 (!) 160/72  03/29/24 (!) 147/57   Uncontrolled here but pt states controlled at home, pt to continue medical treatment  - diet, wt control

## 2024-07-13 NOTE — Progress Notes (Signed)
 The test results show that your current treatment is OK, as the urine tests does not show blood.   Please continue the same plan however as you would still need the cystoscopy.    There is no other need for change of treatment or further evaluation based on these results, at this time.  thanks

## 2024-07-15 ENCOUNTER — Encounter: Payer: Self-pay | Admitting: Internal Medicine

## 2024-07-16 ENCOUNTER — Telehealth: Payer: Self-pay

## 2024-07-16 NOTE — Telephone Encounter (Signed)
 Copied from CRM [id number]. Topic: Clinical - Lab/Test Results >> Jul 16, 2024 10:43 AM [place] A wrote: Reason for CRM: Patient's daughter called wants to make sure message in Mychart is seen-please contact

## 2024-07-18 ENCOUNTER — Encounter: Payer: Self-pay | Admitting: Internal Medicine

## 2024-07-22 NOTE — Telephone Encounter (Signed)
 No number was left to call back daughter, rhona message was sent to pcp to review. Still have not heard back

## 2024-08-04 DIAGNOSIS — R3121 Asymptomatic microscopic hematuria: Secondary | ICD-10-CM | POA: Diagnosis not present

## 2024-08-10 DIAGNOSIS — Z23 Encounter for immunization: Secondary | ICD-10-CM | POA: Diagnosis not present

## 2024-08-18 DIAGNOSIS — L570 Actinic keratosis: Secondary | ICD-10-CM | POA: Diagnosis not present

## 2024-08-18 DIAGNOSIS — D3617 Benign neoplasm of peripheral nerves and autonomic nervous system of trunk, unspecified: Secondary | ICD-10-CM | POA: Diagnosis not present

## 2024-08-18 DIAGNOSIS — Z85828 Personal history of other malignant neoplasm of skin: Secondary | ICD-10-CM | POA: Diagnosis not present

## 2024-08-18 DIAGNOSIS — L820 Inflamed seborrheic keratosis: Secondary | ICD-10-CM | POA: Diagnosis not present

## 2024-08-18 DIAGNOSIS — D1801 Hemangioma of skin and subcutaneous tissue: Secondary | ICD-10-CM | POA: Diagnosis not present

## 2024-08-18 DIAGNOSIS — L821 Other seborrheic keratosis: Secondary | ICD-10-CM | POA: Diagnosis not present

## 2024-09-02 DIAGNOSIS — H9113 Presbycusis, bilateral: Secondary | ICD-10-CM | POA: Insufficient documentation

## 2024-09-02 DIAGNOSIS — H918X3 Other specified hearing loss, bilateral: Secondary | ICD-10-CM | POA: Insufficient documentation

## 2024-09-09 ENCOUNTER — Other Ambulatory Visit (HOSPITAL_COMMUNITY): Payer: Self-pay | Admitting: Otolaryngology

## 2024-09-09 DIAGNOSIS — H9113 Presbycusis, bilateral: Secondary | ICD-10-CM

## 2024-09-09 DIAGNOSIS — H918X3 Other specified hearing loss, bilateral: Secondary | ICD-10-CM

## 2024-09-09 DIAGNOSIS — Z8589 Personal history of malignant neoplasm of other organs and systems: Secondary | ICD-10-CM

## 2024-09-09 DIAGNOSIS — C320 Malignant neoplasm of glottis: Secondary | ICD-10-CM

## 2024-09-11 ENCOUNTER — Ambulatory Visit (HOSPITAL_BASED_OUTPATIENT_CLINIC_OR_DEPARTMENT_OTHER)
Admission: RE | Admit: 2024-09-11 | Discharge: 2024-09-11 | Disposition: A | Source: Ambulatory Visit | Attending: Otolaryngology | Admitting: Otolaryngology

## 2024-09-11 DIAGNOSIS — Z8589 Personal history of malignant neoplasm of other organs and systems: Secondary | ICD-10-CM | POA: Insufficient documentation

## 2024-09-11 DIAGNOSIS — Q048 Other specified congenital malformations of brain: Secondary | ICD-10-CM | POA: Diagnosis not present

## 2024-09-11 DIAGNOSIS — I6782 Cerebral ischemia: Secondary | ICD-10-CM | POA: Diagnosis not present

## 2024-09-11 DIAGNOSIS — H9192 Unspecified hearing loss, left ear: Secondary | ICD-10-CM | POA: Diagnosis not present

## 2024-09-11 DIAGNOSIS — H9113 Presbycusis, bilateral: Secondary | ICD-10-CM | POA: Insufficient documentation

## 2024-09-11 DIAGNOSIS — H918X3 Other specified hearing loss, bilateral: Secondary | ICD-10-CM | POA: Insufficient documentation

## 2024-09-11 DIAGNOSIS — Z8521 Personal history of malignant neoplasm of larynx: Secondary | ICD-10-CM | POA: Diagnosis not present

## 2024-09-11 DIAGNOSIS — C320 Malignant neoplasm of glottis: Secondary | ICD-10-CM | POA: Insufficient documentation

## 2024-09-11 MED ORDER — GADOBUTROL 1 MMOL/ML IV SOLN
6.0000 mL | Freq: Once | INTRAVENOUS | Status: AC | PRN
  Administered 2024-09-11: 6 mL via INTRAVENOUS
  Filled 2024-09-11: qty 6

## 2024-09-21 NOTE — Telephone Encounter (Signed)
 Called pt's wife Grantham Hippert ) to go over test results per Dr. Jesus. Dr. Jesus stated the following  Please tell them that there is no tumor on the MRI. That means that I do not have an explanation for the hearing loss but at this we know that is not something dangerous. No further testing is needed unless something changes.  The pt's wife  Nathanyel Defenbaugh) understood the verbal message given and was very pleased with the phone call.

## 2024-10-05 NOTE — Telephone Encounter (Signed)
 Called pt to go over test results per Dr. Jesus. Dr. Jesus stated the following  Please let them know I reviewed all of the MRI's and there is no tumor or anything else that caused the hearing to change and no further testing is needed. The pt's daughter ETTER Mliss Modest) understood the verbal message and was pleased with the phone call.

## 2024-10-11 NOTE — Progress Notes (Incomplete)
 Radiation Oncology         [phone] ________________________________  Name: Darren Edwards MRN: [id number]  Date: 10/12/2024  DOB: 1928/12/22  Follow-Up Visit Note  CC: Norleen Lynwood ORN, MD  Mable Lenis, MD  Diagnosis and Prior Radiotherapy:     No diagnosis found. 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 Cuadra presents for a follow up appointment with Dr. Izell. He completed radiation therapy 2 years and 2 months ago.   In the interim since he was last seen by us , he saw Dr. Jesus on 09/02/2024. No evidence of recurrent disease was visualized in the larynx of neck. Patient was experiencing a significant drop in hearing on the left side. MRI demonstrated no evidence of tumor or etiologic cause of hearing loss.   ***  There were no vitals taken for this visit.  Wt Readings from Last 3 Encounters:  07/13/24 131 lb 3.2 oz (59.5 kg)  06/09/24 128 lb (58.1 kg)  06/04/24 128 lb 6 oz (58.2 kg)         ALLERGIES:  is allergic to clindamycin and penicillins.  Meds: Current Outpatient Medications  Medication Sig Dispense Refill   Ascorbic Acid (VITAMIN C) 1000 MG tablet Take 1,000 mg by mouth daily.     aspirin  81 MG chewable tablet Chew 81 mg by mouth daily.     cefdinir  (OMNICEF ) 300 MG capsule Take 1 capsule (300 mg total) by mouth 2 (two) times daily. 20 capsule 0   cholecalciferol (VITAMIN D ) 1000 units tablet Take 1,000 Units by mouth daily.     Coenzyme Q10 (CO Q 10) 100 MG CAPS Take 100 mg by mouth daily.  30 capsule 0   Multiple  Vitamins-Minerals (ONE-A-DAY MENS 50+ ADVANTAGE) TABS Take 1 tablet by mouth daily after breakfast.     simvastatin  (ZOCOR ) 20 MG tablet Take 1 tablet (20 mg total) by mouth at bedtime. 90 tablet 3   Zinc 30 MG TABS Take 30 mg by mouth daily.     No current facility-administered medications for this visit.    Physical Findings: *** The patient is in no acute distress. Patient is alert and oriented. Wt Readings from Last 3 Encounters:  07/13/24 131 lb 3.2 oz (59.5 kg)  06/09/24 128 lb (58.1 kg)  06/04/24 128 lb 6 oz (58.2 kg)    vitals were not taken for this visit. .  General: Alert and oriented, in no acute distress HEENT: Head is normocephalic. Extraocular movements are intact. Oropharynx is notable for no lesions in the mouth or upper throat Neck: No palpable masses or significant lymphedema Skin: Skin in treatment fields shows satisfactory healing as above MSK: Well-nourished, well-appearing, ambulatory Psychiatric: Judgment and insight are intact. Affect is appropriate.  PROCEDURE NOTE: After obtaining consent and spraying nasal cavity with topical lidocaine  and oxymetazoline , the flexible endoscope was coated with lidocaine  gel and introduced and passed through the nasal cavity.  The nasopharynx, oropharynx, hypopharynx, and larynx  were then examined. No lesions appreciated in the mucosal axis. The true cords were symmetrically  mobile.  Right cord is mildly more erythematous than left which is not a clinical concern.  No nodules.  Tolerated the procedure well ***  Lab Findings: Lab Results  Component Value Date   WBC 5.1 06/04/2024   HGB 13.5 06/04/2024   HCT 40.0 06/04/2024   MCV 96.2 06/04/2024   PLT 241.0 06/04/2024    Lab Results  Component Value Date   TSH 3.61 06/04/2024    Radiographic Findings: MR BRAIN W WO CONTRAST Result Date: 09/15/2024 EXAM: MRI BRAIN WITH AND WITHOUT CONTRAST 09/11/2024 01:52:36 PM TECHNIQUE: Multiplanar multisequence MRI of the  head/brain was performed with and without the administration of intravenous contrast. CONTRAST: 6 mL of GADAVIST . COMPARISON: MR Head without contrast 02/11/2019. CLINICAL HISTORY: 89 year old male with left-sided hearing loss, history of laryngeal and prostate cancer. FINDINGS: BRAIN AND VENTRICLES: Brain volume is stable and normal for age. Diminutive posterior circulation again noted and appears stable, with suspected fetal type bilateral PCA origins. Following contrast the major dural venous sinuses are enhancing and appear to be patent. 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 No supratentorial Encephalomalacia or chronic cerebral blood products identified. No acute infarct. No acute intracranial hemorrhage. No mass effect or midline shift. No hydrocephalus. The sella is unremarkable. Normal flow voids. No abnormal enhancement of the brain no abnormal dural thickening identified. Small right superior frontal lobe developmental venous anomaly (normal variant) ORBITS: No acute abnormality. SINUSES: Paranasal sinuses remain well aerated. BONES AND SOFT TISSUES: Normal bone marrow signal and enhancement. Normal for age visible cervical spine. No acute soft tissue abnormality. Internal Auditory: Dedicated thin slice pre and postcontrast internal auditory imaging. Normal cerebellopontine angles. Visible bilateral 7th and 8th cranial nerve segments appear normal. Symmetric T2 signal in the bilateral cochlea and vestibular structures. No abnormal enhancement Mastoids are well aerated. Normal stylomastoid foramina. No skull base abnormality identified. Negative visible parotid glands. IMPRESSION: 1. Normal internal auditory imaging. 2. No acute intracranial abnormality. Stable since 2020 mild for age chronic ischemia in the cerebellum. Electronically  signed by: Helayne Hurst MD 09/15/2024 12:41 PM EST RP Workstation: [id number]    Impression/Plan:    1) Head and Neck Cancer Status: No evidence of disease ***  2) Nutritional Status: Doing well, no issues *** PEG tube: None  3)  Swallowing: Continue exercises as instructed by speech-language pathology.   ***  4)   Thyroid  function: Checking annually with [facility] PCP -within normal limits as of Feb 2025 ***   It was wonderful to see the patient and his family today.  They know to call if they have any issues before his next follow-up with me.  I will see him back in 8 months.  Recommend ENT f/u in 66mo. ***  On date of service, in total, I spent 25 minutes on this encounter. Patient was seen in person.   _____________________________________    Leeroy Due, PA-C

## 2024-10-11 NOTE — Progress Notes (Signed)
 Mr. Lappe presents for a follow up appointment with Leeroy Due PA-C. Squamous cell carcinoma of the glottis. He completed treatment on 08-23-22.     Pain issues, if any: Denies Using a feeding tube?: N/A Weight changes, if any:  Wt Readings from Last 4 Encounters:  09/1624           133.2 lb  07/13/24 131 lb 3.2 oz (59.5 kg)  06/09/24 128 lb (58.1 kg)  06/04/24 128 lb 6 oz (58.2 kg)      Swallowing issues, if any: No concerns Smoking or chewing tobacco? Denies Using fluoride toothpaste daily? Denies Last ENT visit was on: 09/02/2024 with Dr. Ida DEL. Jesus, MD Other notable issues, if any: He was concerned about the hearing loss.  BP (!) 143/69 (BP Location: Left Arm, Patient Position: Sitting)   Pulse 77   Temp 97.6 F (36.4 C) (Oral)   Resp 19   Ht 5' 8 (1.727 m)   Wt 133 lb 6.4 oz (60.5 kg)   SpO2 100%   BMI 20.28 kg/m

## 2024-10-12 ENCOUNTER — Ambulatory Visit
Admission: RE | Admit: 2024-10-12 | Discharge: 2024-10-12 | Disposition: A | Payer: Self-pay | Source: Ambulatory Visit | Attending: Radiology | Admitting: Radiology

## 2024-10-12 VITALS — BP 143/69 | HR 77 | Temp 97.6°F | Resp 19 | Ht 68.0 in | Wt 133.4 lb

## 2024-10-12 DIAGNOSIS — Z7982 Long term (current) use of aspirin: Secondary | ICD-10-CM | POA: Diagnosis not present

## 2024-10-12 DIAGNOSIS — C320 Malignant neoplasm of glottis: Secondary | ICD-10-CM | POA: Diagnosis not present

## 2024-10-12 DIAGNOSIS — Z79899 Other long term (current) drug therapy: Secondary | ICD-10-CM | POA: Diagnosis not present

## 2024-10-12 DIAGNOSIS — Z923 Personal history of irradiation: Secondary | ICD-10-CM | POA: Diagnosis not present

## 2024-10-12 DIAGNOSIS — Z8521 Personal history of malignant neoplasm of larynx: Secondary | ICD-10-CM | POA: Diagnosis present

## 2024-10-12 MED ORDER — OXYMETAZOLINE HCL 0.05 % NA SOLN: 1.0000 | Freq: Two times a day (BID) | NASAL | 0 refills | Status: AC

## 2024-10-13 NOTE — Progress Notes (Signed)
 Oncology Nurse Navigator Documentation   Per patient's 10/12/24 post-treatment follow-up with Dr. Izell, sent fax to [facility] ENT Scheduling with request Mr. Berroa be contacted and scheduled for routine post-RT follow-up with Dr. Jesus in 6 months.  Notification of successful fax transmission received.   Delon Jefferson RN, BSN, OCN Head & Neck Oncology Nurse Navigator [facility] at [facility] Phone # [phone]  Fax # [phone]

## 2024-10-26 ENCOUNTER — Ambulatory Visit: Payer: Self-pay

## 2024-10-26 ENCOUNTER — Other Ambulatory Visit: Payer: Self-pay

## 2024-10-26 ENCOUNTER — Emergency Department (HOSPITAL_BASED_OUTPATIENT_CLINIC_OR_DEPARTMENT_OTHER)

## 2024-10-26 ENCOUNTER — Encounter (HOSPITAL_BASED_OUTPATIENT_CLINIC_OR_DEPARTMENT_OTHER): Payer: Self-pay | Admitting: Emergency Medicine

## 2024-10-26 ENCOUNTER — Emergency Department (HOSPITAL_BASED_OUTPATIENT_CLINIC_OR_DEPARTMENT_OTHER)
Admission: EM | Admit: 2024-10-26 | Discharge: 2024-10-26 | Disposition: A | Discharge status: Home / Self Care | Source: Ambulatory Visit

## 2024-10-26 DIAGNOSIS — W19XXXA Unspecified fall, initial encounter: Secondary | ICD-10-CM

## 2024-10-26 DIAGNOSIS — Z7982 Long term (current) use of aspirin: Secondary | ICD-10-CM | POA: Diagnosis not present

## 2024-10-26 DIAGNOSIS — S0993XA Unspecified injury of face, initial encounter: Secondary | ICD-10-CM | POA: Diagnosis present

## 2024-10-26 DIAGNOSIS — W01198A Fall on same level from slipping, tripping and stumbling with subsequent striking against other object, initial encounter: Secondary | ICD-10-CM | POA: Diagnosis not present

## 2024-10-26 DIAGNOSIS — S0181XA Laceration without foreign body of other part of head, initial encounter: Secondary | ICD-10-CM | POA: Insufficient documentation

## 2024-10-26 NOTE — ED Triage Notes (Signed)
 Mechanical fall on Friday afternoon. Hit face over ground. Denies LOC. Denies thinners. Lac to left face.

## 2024-10-26 NOTE — Discharge Instructions (Signed)
 It was a pleasure taking care of you here today.  Your CT scans are reassuring.  Keep a close eye on the wound on your face.  If you develop fever, drainage, severe redness to the area please seek reevaluation  Follow-up with your primary care return for new or worsening symptoms

## 2024-10-26 NOTE — ED Provider Notes (Signed)
 " [place] EMERGENCY DEPARTMENT AT [facility] Provider Note   CSN: [id number] Arrival date & time: 10/26/24  1247     Patient presents with: Darren Edwards is a 89 y.o. male for evaluation of fall.  Patient states he had mechanical fall on Friday 5 days PTA.  Clemens forward landing on his left face.  No LOC, anticoagulation.  He has been ambulatory since.  He had a laceration to his left zygomatic family put over-the-counter wound closure on it.  No altered mental status, neck pain, back pain, chest pain, shortness of breath abdominal pain, pain or swelling to upper and lower extremities.  Patient's brother died after a fall with subsequent head bleed family was concerned for similar bringing him here requesting CT scan. Tetanus up to date   HPI     Prior to Admission medications  Medication Sig Start Date End Date Taking? Authorizing Provider  Ascorbic Acid (VITAMIN C) 1000 MG tablet Take 1,000 mg by mouth daily.    [provider]  aspirin  81 MG chewable tablet Chew 81 mg by mouth daily.    [provider]  cefdinir  (OMNICEF ) 300 MG capsule Take 1 capsule (300 mg total) by mouth 2 (two) times daily. 09/16/23   Geofm Glade PARAS, MD  cholecalciferol (VITAMIN D ) 1000 units tablet Take 1,000 Units by mouth daily.    [provider]  Coenzyme Q10 (CO Q 10) 100 MG CAPS Take 100 mg by mouth daily.  01/04/16   Christi Glendia HERO, MD  Multiple Vitamins-Minerals (ONE-A-DAY MENS 50+ ADVANTAGE) TABS Take 1 tablet by mouth daily after breakfast.    [provider]  oxymetazoline  (AFRIN NASAL SPRAY) 0.05 % nasal spray Place 1 spray into both nostrils 2 (two) times daily. 10/12/24   Wyatt Leeroy HERO, PA-C  simvastatin  (ZOCOR ) 20 MG tablet Take 1 tablet (20 mg total) by mouth at bedtime. 04/16/22   Norleen Lynwood ORN, MD  Zinc 30 MG TABS Take 30 mg by mouth daily.    [provider]    Allergies: Clindamycin and Penicillins    Review of Systems   Constitutional: Negative.   HENT: Negative.    Respiratory: Negative.    Cardiovascular: Negative.   Gastrointestinal: Negative.   Genitourinary: Negative.   Musculoskeletal: Negative.   Skin:  Positive for wound.  Neurological: Negative.   All other systems reviewed and are negative.   Updated Vital Signs BP (!) 141/70 (BP Location: Left Arm)   Pulse 88   Temp 98.7 F (37.1 C)   Resp 17   SpO2 99%   Physical Exam Vitals and nursing note reviewed.  Constitutional:      General: He is not in acute distress.    Appearance: He is well-developed. He is not ill-appearing, toxic-appearing or diaphoretic.  HENT:     Head: Normocephalic.     Comments: Soft tissue swelling, healing laceration over left zygomatic.  No bony tenderness Eyes:     Pupils: Pupils are equal, round, and reactive to light.  Cardiovascular:     Rate and Rhythm: Normal rate and regular rhythm.  Pulmonary:     Effort: Pulmonary effort is normal. No respiratory distress.  Abdominal:     General: There is no distension.     Palpations: Abdomen is soft.  Musculoskeletal:        General: Normal range of motion.     Cervical back: Normal range of motion and neck supple.  Comments: No midline spinal tenderness Nontender bilateral upper and lower extremities, full range of motion  Skin:    General: Skin is warm and dry.     Capillary Refill: Capillary refill takes less than 2 seconds.     Comments: 1 cm laceration left face without surrounding edema, erythema, warmth, fluctuance, induration  Neurological:     General: No focal deficit present.     Mental Status: He is alert and oriented to person, place, and time.     (all labs ordered are listed, but only abnormal results are displayed) Labs Reviewed - No data to display  EKG: None  Radiology: CT Cervical Spine Wo Contrast Result Date: 10/26/2024 EXAM: CT CERVICAL SPINE WITHOUT CONTRAST 10/26/2024 01:16:09 PM TECHNIQUE: CT of the cervical spine  was performed without the administration of intravenous contrast. Multiplanar reformatted images are provided for review. Automated exposure control, iterative reconstruction, and/or weight based adjustment of the mA/kV was utilized to reduce the radiation dose to as low as reasonably achievable. COMPARISON: None available. CLINICAL HISTORY: Fall FINDINGS: BONES AND ALIGNMENT: No acute fracture or traumatic malalignment. DEGENERATIVE CHANGES: Disc space narrowing most pronounced in the lower cervical spine. Degenerative endplate osteophytes at multiple levels. Facet arthrosis and uncovertebral hypertrophy throughout the cervical spine. There is no high grade osseous spinal canal stenosis. SOFT TISSUES: No prevertebral soft tissue swelling. IMPRESSION: 1. No evidence of acute traumatic injury. Electronically signed by: Donnice Mania MD 10/26/2024 02:35 PM EST RP Workstation: [id number]   CT Head Wo Contrast Result Date: 10/26/2024 EXAM: CT HEAD WITHOUT CONTRAST 10/26/2024 01:16:09 PM TECHNIQUE: CT of the head was performed without the administration of intravenous contrast. Automated exposure control, iterative reconstruction, and/or weight based adjustment of the mA/kV was utilized to reduce the radiation dose to as low as reasonably achievable. COMPARISON: MRI 09/11/2024. CLINICAL HISTORY: Fall. FINDINGS: BRAIN AND VENTRICLES: No acute hemorrhage. No evidence of acute infarct. No hydrocephalus. No extra-axial collection. No mass effect or midline shift. Age related cerebral atrophy. 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 Atherosclerosis of the carotid siphons. ORBITS: No acute abnormality. SINUSES: Mucosal thickening in the maxillary sinuses. SOFT TISSUES AND SKULL: No acute soft tissue abnormality. No skull fracture. IMPRESSION: 1. No acute intracranial abnormality. Electronically signed by: Donnice Mania MD 10/26/2024 02:31 PM EST RP Workstation: [id number]   CT Maxillofacial Wo Contrast Result Date:  10/26/2024 EXAM: CT OF THE FACE WITHOUT CONTRAST 10/26/2024 01:16:09 PM TECHNIQUE: CT of the face was performed without the administration of intravenous contrast. Multiplanar reformatted images are provided for review. Automated exposure control, iterative reconstruction, and/or weight based adjustment of the mA/kV was utilized to reduce the radiation dose to as low as reasonably achievable. COMPARISON: None available. CLINICAL HISTORY: Fall. FINDINGS: FACIAL BONES: No acute facial fracture. No mandibular dislocation. No suspicious bone lesion. ORBITS: Globes are intact. No acute traumatic injury. No inflammatory change. SINUSES AND MASTOIDS: Mucosal thickening and mucous retention cysts in the alveolar recesses of the maxillary sinuses. Additional mild mucosal thickening in the anterior right sphenoid sinus. No acute abnormality of the mastoids. SOFT TISSUES: There is mild left facial soft tissue swelling overlying the maxilla. IMPRESSION: 1. No acute facial fracture. 2. Mild left facial soft tissue swelling overlying the maxilla. Electronically signed by: Donnice Mania MD 10/26/2024 02:28 PM EST RP Workstation: [id number]     Procedures   Medications Ordered in the ED - No data to display  89 year old here for eval mechanical fall.  Occurred 4 days PTA.  No LOC, anticoagulation.  Has been ambulatory PTA.  Family requesting head imaging as patient's brother died from a bleed after a fall.  Patient has nonfocal neuroexam.  He has healing laceration over left zygomatic face which does not appear actively infected.  Plan on imaging  Imaging personally viewed and interpreted:  No acute abnormality.  Discussed results with patient, family.  Ambulatory out ED doors wo difficulty with family  The patient has been appropriately medically screened and/or stabilized in the ED. I have low suspicion for any other emergent medical condition which would require further screening, evaluation or treatment in the  ED or require inpatient management.  Patient is hemodynamically stable and in no acute distress.  Patient able to ambulate in department prior to ED.  Evaluation does not show acute pathology that would require ongoing or additional emergent interventions while in the emergency department or further inpatient treatment.  I have discussed the diagnosis with the patient and answered all questions.  Pain is been managed while in the emergency department and patient has no further complaints prior to discharge.  Patient is comfortable with plan discussed in room and is stable for discharge at this time.  I have discussed strict return precautions for returning to the emergency department.  Patient was encouraged to follow-up with PCP/specialist refer to at discharge.                                   Medical Decision Making Amount and/or Complexity of Data Reviewed Independent Historian: spouse External Data Reviewed: labs, radiology and notes. Radiology: ordered and independent interpretation performed. Decision-making details documented in ED Course.  Risk OTC drugs. Decision regarding hospitalization. Diagnosis or treatment significantly limited by social determinants of health.      Final diagnoses:  Fall, initial encounter    ED Discharge Orders     None          Jahzara Slattery A, PA-C 10/26/24 1909    Gennaro Duwaine CROME, DO 10/26/24 2341  "

## 2024-10-26 NOTE — Telephone Encounter (Signed)
 FYI Only or Action Required?: FYI only for provider: ED advised.  Patient was last seen in primary care on 07/13/2024 by Norleen Lynwood ORN, MD.  Called Nurse Triage reporting Fall.  Symptoms began several days ago.  Interventions attempted: Nothing.  Symptoms are: stable.  Triage Disposition: Go to ED Now (Notify PCP)  Patient/caregiver understands and will follow disposition?: Yes  Reason for Disposition  Skin is split open or gaping (or length > 1/2 inch or 12 mm)  Answer Assessment - Initial Assessment Questions Marylin (pt's wife) called stated the pt had a bad fall Saturday 10/23/24, sustained two cuts - one to the side of his left cheek and one to the left side of his temple. The would to the left temple bled is 1/2in in diameter and took an hour to stop bleeding - nueskin  bandage applied. Marylin states the pt's left side of his rib area hurts when he moves.   Marylin denies that the pt has confusion, sleepiness, or dizziness.   Writer recommended that the pt be evaluated at the ED for further evaluation d/t patients age.   1. MECHANISM: How did the injury happen? For falls, ask: What height did you fall from? and What surface did you fall against?      Pt fell when walking 2. ONSET: When did the injury happen? (e.g., minutes, hours ago)      10/23/24 3. NEUROLOGIC SYMPTOMS: Was there any loss of consciousness? Are there any other neurological symptoms?      Denies  4. MENTAL STATUS: Does the person know who they are, who you are, and where they are?      Yes  5. LOCATION: What part of the head was hit?      Left side of the head 6. SCALP APPEARANCE: What does the scalp look like? Is it bleeding now? If Yes, ask: Is it difficult to stop?      Stopped but was bleeding for about an hour 7. SIZE: For cuts, bruises, or swelling, ask: How large is it? (e.g., inches or centimeters)      1/2 inch in diameter on temple 8. PAIN: Is there any pain? If Yes,  ask: How bad is it? (Scale 0-10; or none, mild, moderate, severe)     Pain in the side moderate with movement  9. TETANUS: For any breaks in the skin, ask: When was your last tetanus booster?     N/a 10. BLOOD THINNERS: Do you take any blood thinners? (e.g., aspirin , clopidogrel / Plavix, coumadin, heparin). Notes: Other strong blood thinners include: Arixtra (fondaparinux), Eliquis (apixaban), Pradaxa (dabigatran), and Xarelto (rivaroxaban).       Denies  11. OTHER SYMPTOMS: Do you have any other symptoms? (e.g., neck pain, vomiting)       Denies  Protocols used: Head Injury-A-AH  Copied from CRM #[id number]. Topic: Clinical - Red Word Triage >> Oct 26, 2024  8:04 AM Adelita E wrote: [facility] that prompted transfer to Nurse Triage: Patient fell this past Saturday, left side is aching and patient also hit his head.

## 2024-11-04 ENCOUNTER — Ambulatory Visit: Admitting: Internal Medicine

## 2024-11-04 ENCOUNTER — Ambulatory Visit

## 2024-11-04 ENCOUNTER — Encounter: Payer: Self-pay | Admitting: Internal Medicine

## 2024-11-04 VITALS — BP 142/76 | HR 67 | Temp 98.5°F | Ht 68.0 in | Wt 132.0 lb

## 2024-11-04 DIAGNOSIS — G8929 Other chronic pain: Secondary | ICD-10-CM | POA: Diagnosis not present

## 2024-11-04 DIAGNOSIS — M25512 Pain in left shoulder: Secondary | ICD-10-CM

## 2024-11-04 DIAGNOSIS — H903 Sensorineural hearing loss, bilateral: Secondary | ICD-10-CM | POA: Insufficient documentation

## 2024-11-04 DIAGNOSIS — R0789 Other chest pain: Secondary | ICD-10-CM | POA: Insufficient documentation

## 2024-11-04 DIAGNOSIS — R079 Chest pain, unspecified: Secondary | ICD-10-CM

## 2024-11-04 NOTE — Assessment & Plan Note (Signed)
 Ok for xray left shoulder, consider ortho referral but declines for now

## 2024-11-04 NOTE — Assessment & Plan Note (Signed)
 Atypical, likely msk but can't r/o other - for cxr

## 2024-11-04 NOTE — Patient Instructions (Signed)
 Please continue all other medications as before, and refills have been done if requested.  Please have the pharmacy call with any other refills you may need.  Please keep your appointments with your specialists as you may have planned  Please go to the XRAY Department in the first floor for the x-ray testing  You will be contacted by phone if any changes need to be made immediately.  Otherwise, you will receive a letter about your results with an explanation, but please check with MyChart first.  Please make an Appointment to return in 6 months, or sooner if needed

## 2024-11-04 NOTE — Assessment & Plan Note (Signed)
 Also for left rib xray r/o fx

## 2024-11-04 NOTE — Progress Notes (Signed)
 Patient ID: Darren Edwards, male   DOB: [birth year], 89 y.o.   MRN: [id number]        Chief Complaint: follow up left lateral chest pain after fall       HPI:  Darren Edwards is a 89 y.o. male here with wife and daughter, pt unfortunately had a trip and fall, seen in ED dec 30, fortunately no significant injury found on CT brain, Ct c spine, and maxillofacial; Has done well since then but has left lateral sharp chest pains with breathing since the fall.  Pt denies increased sob or doe, wheezing, orthopnea, PND, increased LE swelling, palpitations, dizziness or syncope.   Pt denies polydipsia, polyuria, or new focal neuro s/s.    Pt denies fever, wt loss, night sweats, loss of appetite, or other constitutional symptoms  Alo has chronic pain left shoulder, asking for imaging       Wt Readings from Last 3 Encounters:  11/04/24 132 lb (59.9 kg)  10/12/24 133 lb 6.4 oz (60.5 kg)  07/13/24 131 lb 3.2 oz (59.5 kg)   BP Readings from Last 3 Encounters:  11/04/24 (!) 142/76  10/26/24 (!) 141/70  10/12/24 (!) 143/69         Past Medical History:  Diagnosis Date   Allergy 1950   Penicillin   Arthritis    Knee   Cancer (HCC)    HX SKIN CANCERS / HX PROSTATE CANCER   Family history of cancer of male genital organ    Family history of genetic mutation for hereditary nonpolyposis colorectal cancer (HNPCC)    Family history of lung cancer    Family history of pancreatic cancer    Family history of prostate cancer    Family history of stomach cancer    GERD (gastroesophageal reflux disease)    GERD (gastroesophageal reflux disease) 10/16/2018   Discontinued   Hypercholesteremia    Prostate cancer (HCC) Nov 2006   Prostectomy   Squamous cell skin cancer 10/16/2018   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 Debrah   JOINT REPLACEMENT  2016   Knee   MICROLARYNGOSCOPY  W/VOCAL CORD INJECTION Right 07/19/2016   Procedure: MICROLARYNGOSCOPY WITH EXCISION OF RIGHT  VOCAL CORD POLYP;  Surgeon: Alm Bouche, MD;  Location: [facility] OR;  Service: ENT;  Laterality: Right;   MICROLARYNGOSCOPY WITH CO2 LASER AND EXCISION OF VOCAL CORD LESION Right 06/26/2022   Procedure: MICROLARYNGOSCOPY WITH VOCAL CORD STIPPING OF RIGHT CORD NODULE;  Surgeon: Bouche Alm, MD;  Location: [facility] OR;  Service: ENT;  Laterality: Right;   PARTIAL KNEE ARTHROPLASTY Left 02/12/2016   Procedure: UNICOMPARTMENTAL LEFT KNEE MEDIALLY;  Surgeon: Donnice Car, MD;  Location: [facility] ORS;  Service: Orthopedics;  Laterality: Left;   PROSTATECTOMY     SKIN CANCER EXCISION     TONSILLECTOMY      reports that he has quit smoking. His smoking use included cigars. He has never used smokeless tobacco. He reports that he does not currently use alcohol after a past usage of about 3.0 standard drinks of alcohol per week. He reports that he does not use drugs. family history includes Cancer in his father, maternal uncle, mother, paternal aunt, paternal aunt, paternal aunt, paternal uncle, paternal uncle, paternal uncle, and sister; Early death in his mother and paternal uncle; Hearing loss in his brother and father; Heart disease in his brother  and father; Lung cancer in his father and sister; Lung cancer (age of onset: 82) in his paternal uncle; Pancreatic cancer in his cousin; Prostate cancer in his cousin; Stomach cancer in his maternal uncle; Stroke in his paternal grandfather; Varicose Veins in his mother. Allergies[1] Medications Ordered Prior to Encounter[2]      ROS:  All others reviewed and negative.  Objective        PE:  BP (!) 142/76 (BP Location: Left Arm, Patient Position: Sitting, Cuff Size: Normal)   Pulse 67   Temp 98.5 F (36.9 C) (Oral)   Ht 5' 8 (1.727 m)   Wt 132 lb (59.9 kg)   SpO2 99%   BMI 20.07 kg/m                 Constitutional: Pt appears in NAD               HENT: Head: NCAT.                 Right Ear: External ear normal.                 Left Ear: External ear normal.                Eyes: . Pupils are equal, round, and reactive to light. Conjunctivae and EOM are normal               Nose: without d/c or deformity               Neck: Neck supple. Gross normal ROM               Cardiovascular: Normal rate and regular rhythm.  ; tender left lateral chest tender               Pulmonary/Chest: Effort normal and breath sounds without rales or wheezing.                Abd:  Soft, NT, ND, + BS, no organomegaly               Neurological: Pt is alert. At baseline orientation, motor grossly intact               Skin: Skin is warm. No rashes, no other new lesions, LE edema - none               Psychiatric: Pt behavior is normal without agitation   Micro: none  Cardiac tracings I have personally interpreted today:  none  Pertinent Radiological findings (summarize): none   Lab Results  Component Value Date   WBC 5.1 06/04/2024   HGB 13.5 06/04/2024   HCT 40.0 06/04/2024   PLT 241.0 06/04/2024   GLUCOSE 99 06/04/2024   CHOL 173 06/04/2024   TRIG 69.0 06/04/2024   HDL 56.10 06/04/2024   LDLCALC 103 (H) 06/04/2024   ALT 13 06/04/2024   AST 17 06/04/2024   NA 142 06/04/2024   K 4.4 06/04/2024   CL 103 06/04/2024   CREATININE 0.84 06/04/2024   BUN 24 (H) 06/04/2024   CO2 30 06/04/2024   TSH 3.61 06/04/2024   PSA 0.00 (L) 01/03/2016   INR 1.04 02/02/2016   HGBA1C 6.4 06/04/2024   Assessment/Plan:  Darren Edwards is a 89 y.o. White or Caucasian [1] male with  has a past medical history of Allergy (1950), Arthritis, Cancer (HCC), Family history of cancer of male genital organ, Family history of genetic mutation for hereditary nonpolyposis  colorectal cancer (HNPCC), Family history of lung cancer, Family history of pancreatic cancer, Family history of prostate cancer, Family history of stomach cancer, GERD (gastroesophageal reflux disease), GERD (gastroesophageal  reflux disease) (10/16/2018), Hypercholesteremia, Prostate cancer [facility]) (Nov 2006), Squamous cell skin cancer (10/16/2018), and Vocal cord cancer (HCC) (10/16/2018).  Rib pain on left side Also for left rib xray r/o fx  Chronic left shoulder pain Ok for xray left shoulder, consider ortho referral but declines for now  Chest pain Atypical, likely msk but can't r/o other - for cxr  Followup: Return in about 6 months (around 05/04/2025).  Lynwood Rush, MD 11/04/2024 7:16 PM [facility]     [1]  Allergies Allergen Reactions   Clindamycin     Dizziness (intolerance)   Penicillins Rash    Has patient had a PCN reaction causing immediate rash, facial/tongue/throat swelling, SOB or lightheadedness with hypotension: Yes Has patient had a PCN reaction causing severe rash involving mucus membranes or skin necrosis: No Has patient had a PCN reaction that required hospitalization: No Has patient had a PCN reaction occurring within the last 10 years: No If all of the above answers are NO, then may proceed with Cephalosporin use.  [2]  Current Outpatient Medications on File Prior to Visit  Medication Sig Dispense Refill   Ascorbic Acid (VITAMIN C) 1000 MG tablet Take 1,000 mg by mouth daily.     aspirin  81 MG chewable tablet Chew 81 mg by mouth daily.     cefdinir  (OMNICEF ) 300 MG capsule Take 1 capsule (300 mg total) by mouth 2 (two) times daily. 20 capsule 0   cholecalciferol (VITAMIN D ) 1000 units tablet Take 1,000 Units by mouth daily.     Coenzyme Q10 (CO Q 10) 100 MG CAPS Take 100 mg by mouth daily.  30 capsule 0   Multiple Vitamins-Minerals (ONE-A-DAY MENS 50+ ADVANTAGE) TABS Take 1 tablet by mouth daily after breakfast.     oxymetazoline  (AFRIN NASAL SPRAY) 0.05 % nasal spray Place 1 spray into both nostrils 2 (two) times daily. 30 mL 0   simvastatin  (ZOCOR ) 20 MG tablet Take 1 tablet (20 mg total) by mouth at bedtime.  90 tablet 3   Zinc 30 MG TABS Take 30 mg by mouth daily.     No current facility-administered medications on file prior to visit.

## 2024-11-10 ENCOUNTER — Ambulatory Visit: Payer: Self-pay | Admitting: Internal Medicine

## 2024-11-12 ENCOUNTER — Encounter: Payer: Self-pay | Admitting: Internal Medicine

## 2025-05-03 ENCOUNTER — Ambulatory Visit: Admitting: Family Medicine

## 2025-05-03 ENCOUNTER — Encounter: Admitting: Family Medicine

## 2025-06-13 ENCOUNTER — Ambulatory Visit

## 2025-10-11 ENCOUNTER — Ambulatory Visit: Admitting: Radiation Oncology
